# Patient Record
Sex: Male | Born: 1940
Health system: Southern US, Community
[De-identification: ages and names within clinical notes are randomized; demographics above are authoritative.]

## PROBLEM LIST (undated history)

## (undated) DIAGNOSIS — N200 Calculus of kidney: Secondary | ICD-10-CM

## (undated) DIAGNOSIS — M1990 Unspecified osteoarthritis, unspecified site: Secondary | ICD-10-CM

## (undated) DIAGNOSIS — Z87442 Personal history of urinary calculi: Secondary | ICD-10-CM

## (undated) DIAGNOSIS — M5430 Sciatica, unspecified side: Secondary | ICD-10-CM

## (undated) DIAGNOSIS — I10 Essential (primary) hypertension: Secondary | ICD-10-CM

## (undated) DIAGNOSIS — C801 Malignant (primary) neoplasm, unspecified: Secondary | ICD-10-CM

## (undated) HISTORY — PX: NEPHRECTOMY: SHX65

## (undated) HISTORY — PX: CATARACT EXTRACTION: SUR2

## (undated) HISTORY — DX: Essential (primary) hypertension: I10

## (undated) HISTORY — PX: KIDNEY SURGERY: SHX687

## (undated) HISTORY — PX: EYE SURGERY: SHX253

---

## 2008-10-23 ENCOUNTER — Emergency Department (HOSPITAL_COMMUNITY): Admission: EM | Admit: 2008-10-23 | Discharge: 2008-10-23 | Payer: Self-pay | Admitting: Emergency Medicine

## 2008-11-02 ENCOUNTER — Ambulatory Visit (HOSPITAL_COMMUNITY): Admission: RE | Admit: 2008-11-02 | Discharge: 2008-11-02 | Payer: Self-pay | Admitting: Urology

## 2010-06-26 LAB — DIFFERENTIAL
Basophils Absolute: 0 K/uL (ref 0.0–0.1)
Basophils Relative: 0 % (ref 0–1)
Eosinophils Absolute: 0.1 K/uL (ref 0.0–0.7)
Eosinophils Relative: 2 % (ref 0–5)
Lymphocytes Relative: 38 % (ref 12–46)
Lymphs Abs: 2.2 K/uL (ref 0.7–4.0)
Monocytes Absolute: 0.4 K/uL (ref 0.1–1.0)
Monocytes Relative: 8 % (ref 3–12)
Neutro Abs: 3 K/uL (ref 1.7–7.7)
Neutrophils Relative %: 52 % (ref 43–77)

## 2010-06-26 LAB — URINE CULTURE
Colony Count: NO GROWTH
Culture: NO GROWTH

## 2010-06-26 LAB — URINALYSIS, ROUTINE W REFLEX MICROSCOPIC
Glucose, UA: NEGATIVE mg/dL
Leukocytes, UA: NEGATIVE
Protein, ur: 100 mg/dL — AB

## 2010-06-26 LAB — CBC
HCT: 42.4 % (ref 39.0–52.0)
Hemoglobin: 14.5 g/dL (ref 13.0–17.0)
MCHC: 34.3 g/dL (ref 30.0–36.0)
MCV: 92.5 fL (ref 78.0–100.0)
Platelets: 208 K/uL (ref 150–400)
RBC: 4.58 MIL/uL (ref 4.22–5.81)
RDW: 13.3 % (ref 11.5–15.5)
WBC: 5.8 K/uL (ref 4.0–10.5)

## 2010-06-26 LAB — URINE MICROSCOPIC-ADD ON

## 2010-06-26 LAB — POCT I-STAT, CHEM 8
BUN: 16 mg/dL (ref 6–23)
Chloride: 107 mEq/L (ref 96–112)
Creatinine, Ser: 1 mg/dL (ref 0.4–1.5)
Glucose, Bld: 105 mg/dL — ABNORMAL HIGH (ref 70–99)
Potassium: 3.8 mEq/L (ref 3.5–5.1)

## 2010-08-18 ENCOUNTER — Other Ambulatory Visit: Payer: Self-pay | Admitting: Family Medicine

## 2010-08-18 DIAGNOSIS — M79604 Pain in right leg: Secondary | ICD-10-CM

## 2010-08-18 DIAGNOSIS — IMO0002 Reserved for concepts with insufficient information to code with codable children: Secondary | ICD-10-CM

## 2010-08-23 ENCOUNTER — Ambulatory Visit (HOSPITAL_COMMUNITY)
Admission: RE | Admit: 2010-08-23 | Discharge: 2010-08-23 | Disposition: A | Discharge status: Home / Self Care | Payer: BC Managed Care – PPO | Source: Ambulatory Visit | Attending: Family Medicine | Admitting: Family Medicine

## 2010-08-23 DIAGNOSIS — M79604 Pain in right leg: Secondary | ICD-10-CM

## 2010-08-23 DIAGNOSIS — I739 Peripheral vascular disease, unspecified: Secondary | ICD-10-CM | POA: Insufficient documentation

## 2010-08-23 DIAGNOSIS — N289 Disorder of kidney and ureter, unspecified: Secondary | ICD-10-CM | POA: Insufficient documentation

## 2010-08-23 DIAGNOSIS — R109 Unspecified abdominal pain: Secondary | ICD-10-CM | POA: Insufficient documentation

## 2010-08-24 ENCOUNTER — Other Ambulatory Visit: Payer: Self-pay | Admitting: Family Medicine

## 2010-08-24 DIAGNOSIS — N2889 Other specified disorders of kidney and ureter: Secondary | ICD-10-CM

## 2010-08-31 ENCOUNTER — Ambulatory Visit
Admission: RE | Admit: 2010-08-31 | Discharge: 2010-08-31 | Disposition: A | Discharge status: Home / Self Care | Payer: BC Managed Care – PPO | Source: Ambulatory Visit | Attending: Family Medicine | Admitting: Family Medicine

## 2010-08-31 DIAGNOSIS — N2889 Other specified disorders of kidney and ureter: Secondary | ICD-10-CM

## 2010-08-31 MED ORDER — GADOBENATE DIMEGLUMINE 529 MG/ML IV SOLN
20.0000 mL | Freq: Once | INTRAVENOUS | Status: AC | PRN
  Administered 2010-08-31: 20 mL via INTRAVENOUS

## 2013-01-30 ENCOUNTER — Other Ambulatory Visit: Payer: Self-pay

## 2013-01-30 MED ORDER — AMLODIPINE BESY-BENAZEPRIL HCL 10-40 MG PO CAPS: 1.0000 | ORAL_CAPSULE | Freq: Every day | ORAL | Status: DC

## 2013-01-30 NOTE — Telephone Encounter (Signed)
ntbs

## 2013-01-30 NOTE — Telephone Encounter (Signed)
Script called to [employer].

## 2013-01-30 NOTE — Telephone Encounter (Signed)
Last seen 04/08/12  [facility]

## 2013-02-28 ENCOUNTER — Other Ambulatory Visit: Payer: Self-pay | Admitting: Nurse Practitioner

## 2013-03-03 NOTE — Telephone Encounter (Signed)
NTBS for future refils

## 2013-03-03 NOTE — Telephone Encounter (Signed)
Last seen 04/08/12  [facility] 

## 2014-06-08 ENCOUNTER — Encounter (HOSPITAL_COMMUNITY): Payer: Self-pay | Admitting: Family Medicine

## 2014-06-08 ENCOUNTER — Emergency Department (HOSPITAL_COMMUNITY)
Admission: EM | Admit: 2014-06-08 | Discharge: 2014-06-08 | Disposition: A | Discharge status: Home / Self Care | Payer: Worker's Compensation | Attending: Emergency Medicine | Admitting: Emergency Medicine

## 2014-06-08 ENCOUNTER — Emergency Department (HOSPITAL_COMMUNITY): Payer: Worker's Compensation

## 2014-06-08 DIAGNOSIS — S60011A Contusion of right thumb without damage to nail, initial encounter: Secondary | ICD-10-CM

## 2014-06-08 DIAGNOSIS — M79645 Pain in left finger(s): Secondary | ICD-10-CM | POA: Diagnosis not present

## 2014-06-08 DIAGNOSIS — S60222A Contusion of left hand, initial encounter: Secondary | ICD-10-CM | POA: Diagnosis not present

## 2014-06-08 DIAGNOSIS — Y9389 Activity, other specified: Secondary | ICD-10-CM | POA: Insufficient documentation

## 2014-06-08 DIAGNOSIS — S6992XA Unspecified injury of left wrist, hand and finger(s), initial encounter: Secondary | ICD-10-CM | POA: Diagnosis not present

## 2014-06-08 DIAGNOSIS — S0003XA Contusion of scalp, initial encounter: Secondary | ICD-10-CM

## 2014-06-08 DIAGNOSIS — Z859 Personal history of malignant neoplasm, unspecified: Secondary | ICD-10-CM | POA: Diagnosis not present

## 2014-06-08 DIAGNOSIS — Z720 Tobacco use: Secondary | ICD-10-CM | POA: Insufficient documentation

## 2014-06-08 DIAGNOSIS — Z79899 Other long term (current) drug therapy: Secondary | ICD-10-CM | POA: Insufficient documentation

## 2014-06-08 DIAGNOSIS — Y9289 Other specified places as the place of occurrence of the external cause: Secondary | ICD-10-CM | POA: Diagnosis not present

## 2014-06-08 DIAGNOSIS — W010XXA Fall on same level from slipping, tripping and stumbling without subsequent striking against object, initial encounter: Secondary | ICD-10-CM | POA: Insufficient documentation

## 2014-06-08 DIAGNOSIS — M25532 Pain in left wrist: Secondary | ICD-10-CM | POA: Diagnosis not present

## 2014-06-08 DIAGNOSIS — S6991XA Unspecified injury of right wrist, hand and finger(s), initial encounter: Secondary | ICD-10-CM | POA: Diagnosis not present

## 2014-06-08 DIAGNOSIS — Y998 Other external cause status: Secondary | ICD-10-CM | POA: Insufficient documentation

## 2014-06-08 DIAGNOSIS — S0990XA Unspecified injury of head, initial encounter: Secondary | ICD-10-CM | POA: Diagnosis present

## 2014-06-08 HISTORY — DX: Malignant (primary) neoplasm, unspecified: C80.1

## 2014-06-08 MED ORDER — HYDROCODONE-ACETAMINOPHEN 5-325 MG PO TABS
1.0000 | ORAL_TABLET | Freq: Once | ORAL | Status: DC
  Filled 2014-06-08: qty 1

## 2014-06-08 MED ORDER — ACETAMINOPHEN 500 MG PO TABS
1000.0000 mg | ORAL_TABLET | Freq: Once | ORAL | Status: AC
  Administered 2014-06-08: 1000 mg via ORAL
  Filled 2014-06-08: qty 2

## 2014-06-08 NOTE — Discharge Instructions (Signed)
Contusion °A contusion is a deep bruise. Contusions are the result of an injury that caused bleeding under the skin. The contusion may turn blue, purple, or yellow. Minor injuries will give you a painless contusion, but more severe contusions may stay painful and swollen for a few weeks.  °CAUSES  °A contusion is usually caused by a blow, trauma, or direct force to an area of the body. °SYMPTOMS  °· Swelling and redness of the injured area. °· Bruising of the injured area. °· Tenderness and soreness of the injured area. °· Pain. °DIAGNOSIS  °The diagnosis can be made by taking a history and physical exam. An X-ray, CT scan, or MRI may be needed to determine if there were any associated injuries, such as fractures. °TREATMENT  °Specific treatment will depend on what area of the body was injured. In general, the best treatment for a contusion is resting, icing, elevating, and applying cold compresses to the injured area. Over-the-counter medicines may also be recommended for pain control. Ask your caregiver what the best treatment is for your contusion. °HOME CARE INSTRUCTIONS  °· Put ice on the injured area. °¨ Put ice in a plastic bag. °¨ Place a towel between your skin and the bag. °¨ Leave the ice on for 15-20 minutes, 3-4 times a day, or as directed by your health care provider. °· Only take over-the-counter or prescription medicines for pain, discomfort, or fever as directed by your caregiver. Your caregiver may recommend avoiding anti-inflammatory medicines (aspirin, ibuprofen, and naproxen) for 48 hours because these medicines may increase bruising. °· Rest the injured area. °· If possible, elevate the injured area to reduce swelling. °SEEK IMMEDIATE MEDICAL CARE IF:  °· You have increased bruising or swelling. °· You have pain that is getting worse. °· Your swelling or pain is not relieved with medicines. °MAKE SURE YOU:  °· Understand these instructions. °· Will watch your condition. °· Will get help right  away if you are not doing well or get worse. °Document Released: 12/14/2004 Document Revised: 03/11/2013 Document Reviewed: 01/09/2011 °ExitCare® Patient Information ©2015 [employer]. This information is not intended to replace advice given to you by your health care provider. Make sure you discuss any questions you have with your health care provider. ° °

## 2014-06-08 NOTE — ED Notes (Signed)
Pt tripped over the curb at work and fell and hit face, head and right side. sts some blurry vision after fall. Denies LOC. Abrasion to face. Denies blood thinners.

## 2014-06-08 NOTE — ED Provider Notes (Signed)
CSN: [id number]     Arrival date & time 06/08/14  1403 History   First MD Initiated Contact with Patient 06/08/14 1635     Chief Complaint  Patient presents with  . Fall     (Consider location/radiation/quality/duration/timing/severity/associated sxs/prior Treatment) Patient is a 74 y.o. male presenting with fall. The history is provided by the patient.  Fall This is a new problem. The current episode started today. The problem occurs constantly. The problem has been resolved. Associated symptoms include arthralgias. Pertinent negatives include no myalgias or neck pain. Exacerbated by: tripped. He has tried nothing for the symptoms. The treatment provided no relief.    Past Medical History  Diagnosis Date  . Cancer    Past Surgical History  Procedure Laterality Date  . Kidney surgery     History reviewed. No pertinent family history. History  Substance Use Topics  . Smoking status: Light Tobacco Smoker    Types: Cigars  . Smokeless tobacco: Not on file  . Alcohol Use: No    Review of Systems  Musculoskeletal: Positive for arthralgias. Negative for myalgias, gait problem and neck pain.  All other systems reviewed and are negative.     Allergies  Review of patient's allergies indicates no known allergies.  Home Medications   Prior to Admission medications   Medication Sig Start Date End Date Taking? Authorizing Provider  Diphenhydramine-APAP, sleep, (TYLENOL PM EXTRA STRENGTH PO) Take 1 tablet by mouth at bedtime as needed (sleep/pain).   Yes Historical Provider, MD  amLODipine-benazepril (LOTREL) 10-40 MG per capsule TAKE ONE CAPSULE ONCE DAILY Patient not taking: Reported on 06/08/2014 02/28/13   Mary-Margaret Hassell Done, FNP   BP 176/90 mmHg  Pulse 72  Temp(Src) 97.6 F (36.4 C)  Resp 18  Wt 233 lb (105.688 kg)  SpO2 97% Physical Exam  Constitutional: He is oriented to person, place, and time. He appears well-developed and well-nourished. No distress.  HENT:   Head: Normocephalic.  Mouth/Throat: Oropharynx is clear and moist. No oropharyngeal exudate.  Abrasion with contusion to right frontal forehead  Eyes: Conjunctivae and EOM are normal. Pupils are equal, round, and reactive to light.  Neck: Normal range of motion. Neck supple.  Cardiovascular: Normal rate, regular rhythm, normal heart sounds and intact distal pulses.  Exam reveals no gallop and no friction rub.   No murmur heard. Pulmonary/Chest: Effort normal and breath sounds normal. No respiratory distress. He has no wheezes. He has no rales.  Abdominal: Soft. He exhibits no distension and no mass. There is no tenderness. There is no rebound and no guarding.  Musculoskeletal: Normal range of motion. He exhibits no edema or tenderness.  Right thumb with contusion over distal phalanx. Point tender with good cap refill. Right hand is neurovascularly intact.  Left hand with bruising over the thenar eminence. Range of motion intact but point tenderness over base of thumb. Neurovascular intact.  Lymphadenopathy:    He has no cervical adenopathy.  Neurological: He is alert and oriented to person, place, and time. He has normal strength. No cranial nerve deficit or sensory deficit. He displays a negative Romberg sign. Coordination and gait normal.  Ambulatory without ataxia. No dysmetria on finger nose finger.  Skin: Skin is warm and dry. No rash noted. He is not diaphoretic.  Psychiatric: He has a normal mood and affect. His behavior is normal. Judgment and thought content normal.  Nursing note and vitals reviewed.   ED Course  Procedures (including critical care time) Labs Review Labs Reviewed -  No data to display  Imaging Review Dg Wrist Complete Left  06/08/2014   CLINICAL DATA:  Tripped at work hurting both hands when trying to brace his fall, pain at thumb and lateral LEFT wrist  EXAM: LEFT WRIST - COMPLETE 3+ VIEW  COMPARISON:  None  FINDINGS: Osseous mineralization normal.   Degenerative changes first CMC joint and at distal pole scaphoid.  Remain joint spaces preserved.  No acute fracture, dislocation or bone destruction.  IMPRESSION: Degenerative changes at first [facility] joint and at radial border of carpus.  No definite acute osseous findings.   Electronically Signed   By: Lavonia Dana M.D.   On: 06/08/2014 17:22   Ct Head Wo Contrast  06/08/2014   CLINICAL DATA:  [place] and hit head today.  EXAM: CT HEAD WITHOUT CONTRAST  TECHNIQUE: Contiguous axial images were obtained from the base of the skull through the vertex without intravenous contrast.  COMPARISON:  None.  FINDINGS: There is a small right frontal scalp hematoma. No underlying skull fracture.  The ventricles are normal. No extra-axial fluid collections. No acute intracranial findings or mass lesions. The brainstem and cerebellum are grossly normal. The bony structures are intact. Moderate chronic right-sided maxillary sinus disease.  IMPRESSION: No acute intracranial findings or skull fracture.  Right frontal scalp hematoma.   Electronically Signed   By: Marijo Sanes M.D.   On: 06/08/2014 18:42   Dg Hand Complete Left  06/08/2014   CLINICAL DATA:  Tripped at work and fell. Pain at thumb and lateral aspect of left wrist.  EXAM: LEFT HAND - COMPLETE 3+ VIEW  COMPARISON:  Wrist films of same date, dictated separately.  FINDINGS: Moderate osteoarthritis involves the base of the thumb. No acute fracture or dislocation. Ossific densities adjacent to the ulnar aspect of the base of the thumb are favored to be degenerative.  IMPRESSION: Advanced osteoarthritis today's the thumb. No convincing evidence of acute superimposed fracture.   Electronically Signed   By: Abigail Miyamoto M.D.   On: 06/08/2014 17:23   Dg Hand Complete Right  06/08/2014   CLINICAL DATA:  Tripped at work, with injury to both hands during fall. Initial encounter.  EXAM: RIGHT HAND - COMPLETE 3+ VIEW  COMPARISON:  None.  FINDINGS: No evidence of acute fracture or  dislocation. No opaque foreign body.  Chronic lucency through the dorsal and medial radial epiphysis which could reflect remote injury or ossicle.  Advanced first [place] osteoarthritis with spurring and mild subluxation. Mild osteoarthritic change also noted at the STT joint. Mild diffuse interphalangeal joint narrowing.  IMPRESSION: 1. No acute osseous findings. 2. Prominent first [place] osteoarthritis.   Electronically Signed   By: Monte Fantasia M.D.   On: 06/08/2014 17:21     EKG Interpretation None      MDM   Final diagnoses:  Scalp contusion, initial encounter  Hand contusion, left, initial encounter  Thumb contusion, right, initial encounter   74 year old old male presents with mechanical fall. Bilateral hand pain and head contusion. Plain films obtained from urgent care are unable to be viewed. Symptoms repeated and CT head ordered. No blood thinners. He is hypertensive and has a history of hypertension but is not on any medications. Question hypertension related pain as he states the blood pressure is usually 751 systolic when he checks at home. Following workup, will DC back to PCP for blood pressure management.  CT negative. Blood pressure improved with pain control. Nonfocal neurologic exam and ambulatory without difficulty. Plain films  of hands and wrists negative. Will DC with supportive care and PCP follow-up.  Larence Penning, MD 06/08/14 Kathyrn Drown  Leonard Schwartz, MD 06/13/14 [phone]

## 2015-02-26 ENCOUNTER — Telehealth: Payer: Self-pay | Admitting: General Practice

## 2015-03-11 ENCOUNTER — Encounter: Payer: Self-pay | Admitting: Family Medicine

## 2015-03-11 ENCOUNTER — Ambulatory Visit (INDEPENDENT_AMBULATORY_CARE_PROVIDER_SITE_OTHER): Payer: Medicare Other | Admitting: Family Medicine

## 2015-03-11 VITALS — BP 194/98 | HR 84 | Temp 97.5°F | Ht 70.0 in | Wt 225.6 lb

## 2015-03-11 DIAGNOSIS — Z23 Encounter for immunization: Secondary | ICD-10-CM

## 2015-03-11 DIAGNOSIS — I10 Essential (primary) hypertension: Secondary | ICD-10-CM | POA: Insufficient documentation

## 2015-03-11 DIAGNOSIS — E785 Hyperlipidemia, unspecified: Secondary | ICD-10-CM

## 2015-03-11 DIAGNOSIS — M25572 Pain in left ankle and joints of left foot: Secondary | ICD-10-CM | POA: Diagnosis not present

## 2015-03-11 MED ORDER — AMLODIPINE BESYLATE 10 MG PO TABS: 10.0000 mg | ORAL_TABLET | Freq: Every day | ORAL | Status: DC

## 2015-03-11 MED ORDER — ROSUVASTATIN CALCIUM 10 MG PO TABS: 10.0000 mg | ORAL_TABLET | Freq: Every day | ORAL | Status: DC

## 2015-03-11 NOTE — Patient Instructions (Signed)
Great to meet you!  Lets follow up in 4-6 weeks for high blood pressure  We will call you with results of your lab work  Start amlodipine 1/2 pill for 4 pills than 1 pill daily  Start crestor for cholesterol

## 2015-03-11 NOTE — Progress Notes (Signed)
   HPI  Patient presents today reestablish care and discuss left ankle pain.  Patient has a history of hypertension and hyperlipidemia. He tolerated Lipitor) name but then had myalgias on generic Lipitor, he stopped this about 4 years ago. His previously treated with amlodipine, benazepril and reports that his blood pressure actually went down from 865 average systolic to 784 average systolic after he stops this, this is also about 4 years ago.  He does not check blood pressure at home.  No chest pain, dyspnea, palpitations. He does have some leg swelling and a day as well as left ankle swelling with pain.  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 He describes it as left lateral ankle pain that radiates up his leg occasionally, it's worse with long walks, he tolerates walking normally easily. After being on his feet for several hours he has pain. It's not really helped much by Tylenol or Aleve. It is improved with flex all cream Mild swelling intermittently  PMH: Smoking status noted ROS: Per HPI  Objective: BP 194/98 mmHg  Pulse 84  Temp(Src) 97.5 F (36.4 C) (Oral)  Ht '5\' 10"'$  (1.778 m)  Wt 225 lb 9.6 oz (102.331 kg)  BMI 32.37 kg/m2 Gen: NAD, alert, cooperative with exam HEENT: NCAT, TMs normal bilaterally, nares clear, oropharynx clear CV: RRR, good S1/S2, no murmur Resp: CTABL, no wheezes, non-labored Abd: SNTND, BS present, no guarding or organomegaly Ext: No edema, warm Neuro: Alert and oriented, No gross deficits Musculoskeletal: Left ankle without any erythema, or gross deformity No tenderness to palpation of any bony or tendinous landmarks No pain with inversion or eversion, Good joint stability  Assessment and plan:  # Hypertension Recommended restarting amlodipine, one half pill for 1 week then 1 pill Return in 4-6 weeks No red  flags Labs, fasting  # Hyperlipidemia try Crestor, did not tolerate generic atorvastatin Labs  # Healthcare maintenance Flu shot Discussed colonoscopy, he declines so he is given an FOBT card  # Ankle pain OA versus mild ankle sprain Recommended supportive care, ice, heat, compression with activity Tylenol, low-dose NSAIDs Consider x-ray if still continue neck  Orders Placed This Encounter  Procedures  . Lipid Panel  . CMP14+EGFR  . CBC with Differential    Meds ordered this encounter  Medications  . amLODipine (NORVASC) 10 MG tablet    Sig: Take 1 tablet (10 mg total) by mouth daily.    Dispense:  90 tablet    Refill:  3  . rosuvastatin (CRESTOR) 10 MG tablet    Sig: Take 1 tablet (10 mg total) by mouth daily.    Dispense:  30 tablet    Refill:  [place], MD [facility] 03/11/2015, 11:08 AM

## 2015-03-12 LAB — CBC WITH DIFFERENTIAL/PLATELET
BASOS: 0 %
Basophils Absolute: 0 10*3/uL (ref 0.0–0.2)
EOS (ABSOLUTE): 0.1 10*3/uL (ref 0.0–0.4)
Eos: 1 %
HEMOGLOBIN: 15.4 g/dL (ref 12.6–17.7)
Hematocrit: 44.7 % (ref 37.5–51.0)
IMMATURE GRANS (ABS): 0 10*3/uL (ref 0.0–0.1)
IMMATURE GRANULOCYTES: 0 %
LYMPHS: 29 %
Lymphocytes Absolute: 1.9 10*3/uL (ref 0.7–3.1)
MCH: 31.5 pg (ref 26.6–33.0)
MCHC: 34.5 g/dL (ref 31.5–35.7)
MCV: 91 fL (ref 79–97)
MONOCYTES: 7 %
Monocytes Absolute: 0.5 10*3/uL (ref 0.1–0.9)
NEUTROS PCT: 63 %
Neutrophils Absolute: 4.1 10*3/uL (ref 1.4–7.0)
PLATELETS: 234 10*3/uL (ref 150–379)
RBC: 4.89 x10E6/uL (ref 4.14–5.80)
RDW: 14 % (ref 12.3–15.4)
WBC: 6.5 10*3/uL (ref 3.4–10.8)

## 2015-03-12 LAB — LIPID PANEL
CHOL/HDL RATIO: 4.4 ratio (ref 0.0–5.0)
Cholesterol, Total: 192 mg/dL (ref 100–199)
HDL: 44 mg/dL (ref >39)
LDL CALC: 101 mg/dL — AB (ref 0–99)
Triglycerides: 233 mg/dL — ABNORMAL HIGH (ref 0–149)
VLDL Cholesterol Cal: 47 mg/dL — ABNORMAL HIGH (ref 5–40)

## 2015-03-12 LAB — CMP14+EGFR
ALT: 14 IU/L (ref 0–44)
AST: 20 IU/L (ref 0–40)
Albumin/Globulin Ratio: 2.4 (ref 1.1–2.5)
Albumin: 4.7 g/dL (ref 3.5–4.8)
Alkaline Phosphatase: 64 IU/L (ref 39–117)
BUN/Creatinine Ratio: 15 (ref 10–22)
BUN: 17 mg/dL (ref 8–27)
Bilirubin Total: 0.5 mg/dL (ref 0.0–1.2)
CALCIUM: 9.2 mg/dL (ref 8.6–10.2)
CO2: 21 mmol/L (ref 18–29)
CREATININE: 1.13 mg/dL (ref 0.76–1.27)
Chloride: 101 mmol/L (ref 96–106)
GFR calc Af Amer: 75 mL/min/{1.73_m2} (ref >59)
GFR, EST NON AFRICAN AMERICAN: 65 mL/min/{1.73_m2} (ref >59)
GLUCOSE: 123 mg/dL — AB (ref 65–99)
Globulin, Total: 2 g/dL (ref 1.5–4.5)
POTASSIUM: 4.7 mmol/L (ref 3.5–5.2)
Sodium: 143 mmol/L (ref 134–144)
TOTAL PROTEIN: 6.7 g/dL (ref 6.0–8.5)

## 2015-04-08 ENCOUNTER — Encounter: Payer: Self-pay | Admitting: Family Medicine

## 2015-04-08 ENCOUNTER — Ambulatory Visit (INDEPENDENT_AMBULATORY_CARE_PROVIDER_SITE_OTHER): Payer: Medicare Other | Admitting: Family Medicine

## 2015-04-08 VITALS — BP 162/83 | HR 78 | Temp 98.1°F | Ht 70.0 in | Wt 224.0 lb

## 2015-04-08 DIAGNOSIS — M25572 Pain in left ankle and joints of left foot: Secondary | ICD-10-CM

## 2015-04-08 DIAGNOSIS — I10 Essential (primary) hypertension: Secondary | ICD-10-CM

## 2015-04-08 DIAGNOSIS — R7301 Impaired fasting glucose: Secondary | ICD-10-CM | POA: Diagnosis not present

## 2015-04-08 DIAGNOSIS — E785 Hyperlipidemia, unspecified: Secondary | ICD-10-CM | POA: Diagnosis not present

## 2015-04-08 MED ORDER — AMLODIPINE BESYLATE 5 MG PO TABS: 5.0000 mg | ORAL_TABLET | Freq: Every day | ORAL | Status: DC

## 2015-04-08 NOTE — Progress Notes (Signed)
   HPI  Patient presents today to discuss hypertension and hyperlipidemia.  Hypertension Does not check at home No chest pain, palpitations, leg edema He has had headaches since starting the medication, he's also had weakness and fatigue. Before his energy was very good.  His ankle pain has resolved.  Hyperlipidemia Not tolerating rosuvastatin, complains of multiple myalgias specifically the legs, shoulders, and arms.  Aware of his impaired fasting glucose and is watching his diet much more closely.  PMH: Smoking status noted ROS: Per HPI  Objective: BP 162/83 mmHg  Pulse 78  Temp(Src) 98.1 F (36.7 C) (Oral)  Ht 5\' 10"  (1.778 m)  Wt 224 lb (101.606 kg)  BMI 32.14 kg/m2 Gen: NAD, alert, cooperative with exam HEENT: NCAT CV: RRR, good S1/S2, no murmur Resp: CTABL, no wheezes, non-labored Ext: No edema, warm Neuro: Alert and oriented, No gross deficits  Assessment and plan:  # Hypertension Still not at goal, however he is not tolerating the adjustment so far. Slowdown treatment and back off to amlodipine 5 mg Plan add on 5 additional milligrams or different agent in 2 months if he is tolerating this. Consider HCTZ or lisinopril if he does not tolerate this, as he is concerned that his symptoms are due to amlodipine   # Hyperlipidemia LDL of 101, no compelling reason to treat him aggressively considering his statin intolerance. He is now having myalgias with generic Crestor and Lipitor, if he needs a statin in the future I think we should consider pitavastatin or brand-name Lipitor  # impaired fasting glucose Discussed, diet and exercise He understand  # ankle pain resolved   Meds ordered this encounter  Medications  . amLODipine (NORVASC) 5 MG tablet    Sig: Take 1 tablet (5 mg total) by mouth daily.    Dispense:  30 tablet    Refill:  [place], MD [facility] 04/08/2015, 8:36 AM

## 2015-04-08 NOTE — Patient Instructions (Signed)
Great to see you!  I am so sorry for your reaction, I think it is mostly due to crestor, but we will slow down on the blood pressure treatment as well  Stop rosuvastatin  Decrease amlodipine to 5 mg, I have sent a new Rx  Call me in 2 weeks if the pains havent stopped and we can change amlodipine as well  Lets follow up in 2 months

## 2016-05-01 ENCOUNTER — Encounter: Payer: Self-pay | Admitting: Family Medicine

## 2016-05-01 ENCOUNTER — Encounter (INDEPENDENT_AMBULATORY_CARE_PROVIDER_SITE_OTHER): Payer: Self-pay

## 2016-05-01 ENCOUNTER — Ambulatory Visit (INDEPENDENT_AMBULATORY_CARE_PROVIDER_SITE_OTHER): Payer: Medicare Other | Admitting: Family Medicine

## 2016-05-01 VITALS — BP 154/81 | HR 57 | Temp 97.3°F | Ht 70.0 in | Wt 213.6 lb

## 2016-05-01 DIAGNOSIS — E785 Hyperlipidemia, unspecified: Secondary | ICD-10-CM | POA: Diagnosis not present

## 2016-05-01 DIAGNOSIS — J0100 Acute maxillary sinusitis, unspecified: Secondary | ICD-10-CM

## 2016-05-01 DIAGNOSIS — I10 Essential (primary) hypertension: Secondary | ICD-10-CM

## 2016-05-01 DIAGNOSIS — Z7689 Persons encountering health services in other specified circumstances: Secondary | ICD-10-CM | POA: Diagnosis not present

## 2016-05-01 MED ORDER — LEVOFLOXACIN 500 MG PO TABS: 500.0000 mg | ORAL_TABLET | Freq: Every day | ORAL | 0 refills | Status: DC

## 2016-05-01 NOTE — Patient Instructions (Signed)
Great to see you!  I believe your shaking episodes ae from the illness, PLease come back right away if anything gets worse.    Sinusitis, Adult Sinusitis is soreness and inflammation of your sinuses. Sinuses are hollow spaces in the bones around your face. They are located:  Around your eyes.  In the middle of your forehead.  Behind your nose.  In your cheekbones. Your sinuses and nasal passages are lined with a stringy fluid (mucus). Mucus normally drains out of your sinuses. When your nasal tissues get inflamed or swollen, the mucus can get trapped or blocked so air cannot flow through your sinuses. This lets bacteria, viruses, and funguses grow, and that leads to infection. Follow these instructions at home: Medicines  Take, use, or apply over-the-counter and prescription medicines only as told by your doctor. These may include nasal sprays.  If you were prescribed an antibiotic medicine, take it as told by your doctor. Do not stop taking the antibiotic even if you start to feel better. Hydrate and Humidify  Drink enough water to keep your pee (urine) clear or pale yellow.  Use a cool mist humidifier to keep the humidity level in your home above 50%.  Breathe in steam for 10-15 minutes, 3-4 times a day or as told by your doctor. You can do this in the bathroom while a hot shower is running.  Try not to spend time in cool or dry air. Rest  Rest as much as possible.  Sleep with your head raised (elevated).  Make sure to get enough sleep each night. General instructions  Put a warm, moist washcloth on your face 3-4 times a day or as told by your doctor. This will help with discomfort.  Wash your hands often with soap and water. If there is no soap and water, use hand sanitizer.  Do not smoke. Avoid being around people who are smoking (secondhand smoke).  Keep all follow-up visits as told by your doctor. This is important. Contact a doctor if:  You have a  fever.  Your symptoms get worse.  Your symptoms do not get better within 10 days. Get help right away if:  You have a very bad headache.  You cannot stop throwing up (vomiting).  You have pain or swelling around your face or eyes.  You have trouble seeing.  You feel confused.  Your neck is stiff.  You have trouble breathing. This information is not intended to replace advice given to you by your health care provider. Make sure you discuss any questions you have with your health care provider. Document Released: 08/23/2007 Document Revised: 10/31/2015 Document Reviewed: 12/30/2014 Elsevier Interactive Patient Education  2017 [employer].

## 2016-05-01 NOTE — Progress Notes (Signed)
   HPI  Patient presents today here with shaking spells and concern for sinus infection.  Patient also states that he's fasting and requests annual labs. Hyperlipidemia Watching diet moderately, no regular exercise.  Hypertension Good medication compliance, no chest pain.  Acute illness Patient states for about 2 weeks he's had frontal sinus pain and pressure. He's been treating it with nasal steroids with not much improvement. Starting Friday he had 2 episodes of shaking chills. He explains that they lasted 5-10 minutes per episode and his shaking was nearly uncontrollable. He does have arthritis and some aches and pains, however they felt better after the shaking. He did not have a postictal state or loss of consciousness.    PMH: Smoking status noted ROS: Per HPI  Objective: BP (!) 154/81   Pulse (!) 57   Temp 97.3 F (36.3 C) (Oral)   Ht 5\' 10"  (1.778 m)   Wt 213 lb 9.6 oz (96.9 kg)   BMI 30.65 kg/m  Gen: NAD, alert, cooperative with exam HEENT: NCAT, pharynx moist and clear, nares with some erythema but clear, TMs normal bilaterally, EOMI, PERRLA, cloudiness of the left lens - suspect cataract  CV: RRR, good S1/S2, no murmur Resp: CTABL, no wheezes, non-labored Abd: SNTND, BS present, no guarding or organomegaly Ext: No edema, warm Neuro: Alert and oriented, No gross deficits  Assessment and plan:  # Acute maxillary sinusitis Treatment Levaquin, treating more aggressively than average given shaking chills. Patient could have underlying influenza, however given her comorbidities he would not be within the 48-hour window for Tamiflu He is afebrile and well-appearing on exam  # hypertension Elevated blood pressure, continue amlodipine, BP goal 150 given his age  # hyperlipidemia Repeat labs No medications at this time    Orders Placed This Encounter  Procedures  . CBC with Differential/Platelet  . COMPLETE METABOLIC PANEL WITH GFR  . Lipid panel  . TSH      Meds ordered this encounter  Medications  . levofloxacin (LEVAQUIN) 500 MG tablet    Sig: Take 1 tablet (500 mg total) by mouth daily.    Dispense:  10 tablet    Refill:  0    Laroy Apple, MD [facility] 05/01/2016, 9:46 AM

## 2016-05-02 LAB — CBC WITH DIFFERENTIAL/PLATELET
BASOS: 0 %
Basophils Absolute: 0 10*3/uL (ref 0.0–0.2)
EOS (ABSOLUTE): 0.1 10*3/uL (ref 0.0–0.4)
EOS: 2 %
HEMATOCRIT: 38.1 % (ref 37.5–51.0)
Hemoglobin: 12.7 g/dL — ABNORMAL LOW (ref 13.0–17.7)
IMMATURE GRANS (ABS): 0 10*3/uL (ref 0.0–0.1)
IMMATURE GRANULOCYTES: 0 %
LYMPHS: 30 %
Lymphocytes Absolute: 1.9 10*3/uL (ref 0.7–3.1)
MCH: 31 pg (ref 26.6–33.0)
MCHC: 33.3 g/dL (ref 31.5–35.7)
MCV: 93 fL (ref 79–97)
Monocytes Absolute: 0.7 10*3/uL (ref 0.1–0.9)
Monocytes: 12 %
NEUTROS ABS: 3.4 10*3/uL (ref 1.4–7.0)
NEUTROS PCT: 56 %
Platelets: 166 10*3/uL (ref 150–379)
RBC: 4.1 x10E6/uL — ABNORMAL LOW (ref 4.14–5.80)
RDW: 14.9 % (ref 12.3–15.4)
WBC: 6.2 10*3/uL (ref 3.4–10.8)

## 2016-05-02 LAB — CMP14+EGFR
A/G RATIO: 2 (ref 1.2–2.2)
ALK PHOS: 44 IU/L (ref 39–117)
ALT: 12 IU/L (ref 0–44)
AST: 19 IU/L (ref 0–40)
Albumin: 4 g/dL (ref 3.5–4.8)
BILIRUBIN TOTAL: 0.5 mg/dL (ref 0.0–1.2)
BUN/Creatinine Ratio: 10 (ref 10–24)
BUN: 12 mg/dL (ref 8–27)
CO2: 26 mmol/L (ref 18–29)
Calcium: 8.6 mg/dL (ref 8.6–10.2)
Chloride: 102 mmol/L (ref 96–106)
Creatinine, Ser: 1.15 mg/dL (ref 0.76–1.27)
GFR calc Af Amer: 73 mL/min/{1.73_m2} (ref >59)
GFR, EST NON AFRICAN AMERICAN: 63 mL/min/{1.73_m2} (ref >59)
GLOBULIN, TOTAL: 2 g/dL (ref 1.5–4.5)
Glucose: 130 mg/dL — ABNORMAL HIGH (ref 65–99)
POTASSIUM: 4 mmol/L (ref 3.5–5.2)
SODIUM: 142 mmol/L (ref 134–144)
Total Protein: 6 g/dL (ref 6.0–8.5)

## 2016-05-02 LAB — LIPID PANEL
CHOL/HDL RATIO: 4 ratio (ref 0.0–5.0)
Cholesterol, Total: 148 mg/dL (ref 100–199)
HDL: 37 mg/dL — AB (ref >39)
LDL CALC: 77 mg/dL (ref 0–99)
Triglycerides: 170 mg/dL — ABNORMAL HIGH (ref 0–149)
VLDL CHOLESTEROL CAL: 34 mg/dL (ref 5–40)

## 2016-05-02 LAB — TSH: TSH: 2.48 u[IU]/mL (ref 0.450–4.500)

## 2016-05-04 LAB — SPECIMEN STATUS REPORT

## 2016-05-04 LAB — HGB A1C W/O EAG: Hgb A1c MFr Bld: 5.5 % (ref 4.8–5.6)

## 2016-05-06 ENCOUNTER — Telehealth: Payer: Self-pay | Admitting: Family Medicine

## 2016-05-08 ENCOUNTER — Other Ambulatory Visit: Payer: Self-pay | Admitting: *Deleted

## 2016-05-08 DIAGNOSIS — R718 Other abnormality of red blood cells: Secondary | ICD-10-CM

## 2016-05-08 NOTE — Telephone Encounter (Signed)
Pt aware of labs & recommendations

## 2016-05-11 DIAGNOSIS — H2513 Age-related nuclear cataract, bilateral: Secondary | ICD-10-CM | POA: Diagnosis not present

## 2016-06-05 DIAGNOSIS — H5703 Miosis: Secondary | ICD-10-CM | POA: Diagnosis not present

## 2016-06-05 DIAGNOSIS — H2513 Age-related nuclear cataract, bilateral: Secondary | ICD-10-CM | POA: Diagnosis not present

## 2016-06-05 DIAGNOSIS — H2511 Age-related nuclear cataract, right eye: Secondary | ICD-10-CM | POA: Diagnosis not present

## 2016-06-05 DIAGNOSIS — H25013 Cortical age-related cataract, bilateral: Secondary | ICD-10-CM | POA: Diagnosis not present

## 2016-06-20 DIAGNOSIS — H2511 Age-related nuclear cataract, right eye: Secondary | ICD-10-CM | POA: Diagnosis not present

## 2016-06-20 DIAGNOSIS — H25811 Combined forms of age-related cataract, right eye: Secondary | ICD-10-CM | POA: Diagnosis not present

## 2016-07-06 DIAGNOSIS — H2512 Age-related nuclear cataract, left eye: Secondary | ICD-10-CM | POA: Diagnosis not present

## 2016-07-11 DIAGNOSIS — H2522 Age-related cataract, morgagnian type, left eye: Secondary | ICD-10-CM | POA: Diagnosis not present

## 2016-07-11 DIAGNOSIS — H2512 Age-related nuclear cataract, left eye: Secondary | ICD-10-CM | POA: Diagnosis not present

## 2016-07-11 DIAGNOSIS — H25812 Combined forms of age-related cataract, left eye: Secondary | ICD-10-CM | POA: Diagnosis not present

## 2016-08-17 ENCOUNTER — Ambulatory Visit (INDEPENDENT_AMBULATORY_CARE_PROVIDER_SITE_OTHER): Payer: Medicare Other | Admitting: Family Medicine

## 2016-08-17 ENCOUNTER — Encounter: Payer: Self-pay | Admitting: Family Medicine

## 2016-08-17 DIAGNOSIS — I10 Essential (primary) hypertension: Secondary | ICD-10-CM

## 2016-08-17 DIAGNOSIS — D649 Anemia, unspecified: Secondary | ICD-10-CM

## 2016-08-17 DIAGNOSIS — R718 Other abnormality of red blood cells: Secondary | ICD-10-CM | POA: Diagnosis not present

## 2016-08-17 LAB — CBC WITH DIFFERENTIAL/PLATELET
BASOS ABS: 0 10*3/uL (ref 0.0–0.2)
Basos: 1 %
EOS (ABSOLUTE): 0.1 10*3/uL (ref 0.0–0.4)
EOS: 2 %
HEMATOCRIT: 44.6 % (ref 37.5–51.0)
HEMOGLOBIN: 14.8 g/dL (ref 13.0–17.7)
IMMATURE GRANS (ABS): 0 10*3/uL (ref 0.0–0.1)
IMMATURE GRANULOCYTES: 0 %
LYMPHS ABS: 1.9 10*3/uL (ref 0.7–3.1)
LYMPHS: 32 %
MCH: 30.5 pg (ref 26.6–33.0)
MCHC: 33.2 g/dL (ref 31.5–35.7)
MCV: 92 fL (ref 79–97)
MONOCYTES: 10 %
Monocytes Absolute: 0.6 10*3/uL (ref 0.1–0.9)
NEUTROS PCT: 55 %
Neutrophils Absolute: 3.2 10*3/uL (ref 1.4–7.0)
Platelets: 243 10*3/uL (ref 150–379)
RBC: 4.85 x10E6/uL (ref 4.14–5.80)
RDW: 14.6 % (ref 12.3–15.4)
WBC: 5.8 10*3/uL (ref 3.4–10.8)

## 2016-08-17 MED ORDER — AMLODIPINE BESYLATE 5 MG PO TABS: 5.0000 mg | ORAL_TABLET | Freq: Every day | ORAL | 3 refills | Status: DC

## 2016-08-17 NOTE — Addendum Note (Signed)
Addended by: Nigel Berthold C on: 08/17/2016 01:40 PM   Modules accepted: Orders

## 2016-08-17 NOTE — Addendum Note (Signed)
Addended by: Nigel Berthold C on: 08/17/2016 01:41 PM   Modules accepted: Orders

## 2016-08-17 NOTE — Progress Notes (Signed)
   HPI  Patient presents today for follow-up of anemia and hypertension.  Anemia Recently new finding. Mild fatigue. No blood loss except for some hematuria intermittently with known kidney stones. Patient does not want colonoscopy for screening purposes, he does agree to FOBT and f/u c scope if needed  Hypertension Good medication compliance, 10 mg amlodipine caused hypotension. Patient with blood pressure that's normal at other locations, recently 130s when he had his cataract follow-up. Does not check at home No chest pain or dyspnea.  PMH: Smoking status noted ROS: Per HPI  Objective: BP (!) 152/87   Pulse 81   Temp 98.3 F (36.8 C) (Oral)   Ht 5\' 10"  (1.778 m)   Wt 217 lb 6.4 oz (98.6 kg)   BMI 31.19 kg/m  Gen: NAD, alert, cooperative with exam HEENT: NCAT CV: RRR, good S1/S2, no murmur Resp: CTABL, no wheezes, non-labored Ext: No edema, warm Neuro: Alert and oriented, No gross deficits  Assessment and plan:  # Normocytic anemia Repeat labs, with iron and B12 FOBT- recommended C scope which he is hesistant about    # HTN  controlled at home, elevated here Continue amlodipine 5 mg Offered titrating medication, he also is hesitant about this   Orders Placed This Encounter  Procedures  . Anemia panel    Meds ordered this encounter  Medications  . amLODipine (NORVASC) 5 MG tablet    Sig: Take 1 tablet (5 mg total) by mouth daily.    Dispense:  90 tablet    Refill:  [place], MD [place] 08/17/2016, 12:04 PM

## 2016-08-18 LAB — ANEMIA PROFILE B
FERRITIN: 277 ng/mL (ref 30–400)
Folate: 8.6 ng/mL (ref >3.0)
IRON SATURATION: 20 % (ref 15–55)
IRON: 64 ug/dL (ref 38–169)
TIBC: 325 ug/dL (ref 250–450)
UIBC: 261 ug/dL (ref 111–343)
Vitamin B-12: 743 pg/mL (ref 232–1245)

## 2016-08-18 LAB — SPECIMEN STATUS REPORT

## 2016-08-18 LAB — RETICULOCYTES: Retic Ct Pct: 1.5 % (ref 0.6–2.6)

## 2016-08-21 ENCOUNTER — Telehealth: Payer: Self-pay | Admitting: Family Medicine

## 2016-08-22 NOTE — Telephone Encounter (Signed)
lmtcb

## 2016-08-30 NOTE — Telephone Encounter (Signed)
Aware. 

## 2016-11-09 DIAGNOSIS — H35363 Drusen (degenerative) of macula, bilateral: Secondary | ICD-10-CM | POA: Diagnosis not present

## 2016-11-09 DIAGNOSIS — Z961 Presence of intraocular lens: Secondary | ICD-10-CM | POA: Diagnosis not present

## 2016-11-09 DIAGNOSIS — H26493 Other secondary cataract, bilateral: Secondary | ICD-10-CM | POA: Diagnosis not present

## 2016-11-09 DIAGNOSIS — H26492 Other secondary cataract, left eye: Secondary | ICD-10-CM | POA: Diagnosis not present

## 2016-11-09 DIAGNOSIS — H35031 Hypertensive retinopathy, right eye: Secondary | ICD-10-CM | POA: Diagnosis not present

## 2016-12-14 DIAGNOSIS — H26491 Other secondary cataract, right eye: Secondary | ICD-10-CM | POA: Diagnosis not present

## 2017-05-10 DIAGNOSIS — H43393 Other vitreous opacities, bilateral: Secondary | ICD-10-CM | POA: Diagnosis not present

## 2017-05-10 DIAGNOSIS — Z961 Presence of intraocular lens: Secondary | ICD-10-CM | POA: Diagnosis not present

## 2017-05-12 ENCOUNTER — Ambulatory Visit (INDEPENDENT_AMBULATORY_CARE_PROVIDER_SITE_OTHER): Payer: Medicare Other | Admitting: Physician Assistant

## 2017-05-12 ENCOUNTER — Encounter: Payer: Self-pay | Admitting: Physician Assistant

## 2017-05-12 VITALS — BP 130/88 | HR 84 | Temp 99.0°F | Ht 70.0 in | Wt 212.4 lb

## 2017-05-12 DIAGNOSIS — J111 Influenza due to unidentified influenza virus with other respiratory manifestations: Secondary | ICD-10-CM

## 2017-05-12 DIAGNOSIS — J0111 Acute recurrent frontal sinusitis: Secondary | ICD-10-CM

## 2017-05-12 MED ORDER — AMOXICILLIN 500 MG PO CAPS: 1000.0000 mg | ORAL_CAPSULE | Freq: Two times a day (BID) | ORAL | 0 refills | Status: DC

## 2017-05-12 MED ORDER — OSELTAMIVIR PHOSPHATE 75 MG PO CAPS: 75.0000 mg | ORAL_CAPSULE | Freq: Two times a day (BID) | ORAL | 0 refills | Status: DC

## 2017-05-12 NOTE — Patient Instructions (Signed)
In a few days you may receive a survey in the mail or online from [employer] regarding your visit with us today. Please take a moment to fill this out. Your feedback is very important to our whole office. It can help us better understand your needs as well as improve your experience and satisfaction. Thank you for taking your time to complete it. We care about you.  Dickie Labarre, PA-C  

## 2017-05-14 NOTE — Progress Notes (Signed)
BP 130/88   Pulse 84   Temp 99 F (37.2 C) (Oral)   Ht 5\' 10"  (1.778 m)   Wt 212 lb 6.4 oz (96.3 kg)   BMI 30.48 kg/m    Subjective:    Patient ID: Jesse Mane., male    DOB: Aug 25, 1940, 77 y.o.   MRN: [id number]  HPI: Jesse Vegh. is a 77 y.o. male presenting on 05/12/2017 for Facial Pain and Nasal Congestion This patient has had less than 2 days severe fever, chills, myalgias.  Complains of sinus headache and postnasal drainage. There is copious drainage at times. Associated sore throat, decreased appetite and headache.  Has been exposed to influenza.   Past Medical History:  Diagnosis Date  . Cancer [facility])    Relevant past medical, surgical, family and social history reviewed and updated as indicated. Interim medical history since our last visit reviewed. Allergies and medications reviewed and updated. DATA REVIEWED: CHART IN EPIC  Family History reviewed for pertinent findings.  Review of Systems  Constitutional: Positive for activity change, fatigue and fever. Negative for appetite change.  HENT: Positive for congestion and sore throat. Negative for sinus pressure.   Eyes: Negative.  Negative for pain and visual disturbance.  Respiratory: Negative for cough, chest tightness, shortness of breath and wheezing.   Cardiovascular: Negative.  Negative for chest pain, palpitations and leg swelling.  Gastrointestinal: Positive for nausea. Negative for abdominal pain, diarrhea and vomiting.  Endocrine: Negative.   Genitourinary: Negative.   Musculoskeletal: Positive for back pain and myalgias. Negative for arthralgias.  Skin: Negative.  Negative for color change and rash.  Neurological: Positive for headaches. Negative for weakness and numbness.  Psychiatric/Behavioral: Negative.     Allergies as of 05/12/2017   No Known Allergies     Medication List        Accurate as of 05/12/17 11:59 PM. Always use your most recent med list.          amoxicillin 500 MG  capsule Commonly known as:  AMOXIL Take 2 capsules (1,000 mg total) by mouth 2 (two) times daily.   oseltamivir 75 MG capsule Commonly known as:  TAMIFLU Take 1 capsule (75 mg total) by mouth 2 (two) times daily.          Objective:    BP 130/88   Pulse 84   Temp 99 F (37.2 C) (Oral)   Ht 5\' 10"  (1.778 m)   Wt 212 lb 6.4 oz (96.3 kg)   BMI 30.48 kg/m   No Known Allergies  Wt Readings from Last 3 Encounters:  05/12/17 212 lb 6.4 oz (96.3 kg)  08/17/16 217 lb 6.4 oz (98.6 kg)  05/01/16 213 lb 9.6 oz (96.9 kg)    Physical Exam  Constitutional: He is oriented to person, place, and time. He appears well-developed and well-nourished. He appears distressed.  HENT:  Head: Normocephalic and atraumatic.  Right Ear: Tympanic membrane normal. No drainage. No middle ear effusion.  Left Ear: Tympanic membrane normal. No drainage.  No middle ear effusion.  Nose: Mucosal edema and rhinorrhea present. Right sinus exhibits no maxillary sinus tenderness. Left sinus exhibits no maxillary sinus tenderness.  Mouth/Throat: Uvula is midline. Posterior oropharyngeal erythema present. No oropharyngeal exudate.  Eyes: Conjunctivae and EOM are normal. Pupils are equal, round, and reactive to light. Right eye exhibits no discharge. Left eye exhibits no discharge.  Neck: Normal range of motion.  Cardiovascular: Normal rate, regular rhythm and normal heart sounds.  Pulmonary/Chest: Effort normal and breath sounds normal. No respiratory distress. He has no wheezes.  Abdominal: Soft.  Lymphadenopathy:    He has no cervical adenopathy.  Neurological: He is alert and oriented to person, place, and time.  Skin: Skin is warm and dry.  Psychiatric: He has a normal mood and affect. His behavior is normal.  Nursing note and vitals reviewed.       Assessment & Plan:   1. Influenza - oseltamivir (TAMIFLU) 75 MG capsule; Take 1 capsule (75 mg total) by mouth 2 (two) times daily.  Dispense: 10 capsule;  Refill: 0  2. Acute recurrent frontal sinusitis - amoxicillin (AMOXIL) 500 MG capsule; Take 2 capsules (1,000 mg total) by mouth 2 (two) times daily.  Dispense: 40 capsule; Refill: 0   Continue all other maintenance medications as listed above.  Follow up plan: No Follow-up on file.  Educational handout given for [place] PA-C [place] [address]  [place], [place] [address] [phone]   05/14/2017, 10:09 PM

## 2017-05-16 ENCOUNTER — Ambulatory Visit (INDEPENDENT_AMBULATORY_CARE_PROVIDER_SITE_OTHER): Payer: Medicare Other | Admitting: Physician Assistant

## 2017-05-16 ENCOUNTER — Other Ambulatory Visit: Payer: Self-pay | Admitting: *Deleted

## 2017-05-16 ENCOUNTER — Encounter: Payer: Self-pay | Admitting: Physician Assistant

## 2017-05-16 VITALS — BP 195/85 | HR 55 | Temp 97.6°F | Ht 70.0 in | Wt 212.0 lb

## 2017-05-16 DIAGNOSIS — J111 Influenza due to unidentified influenza virus with other respiratory manifestations: Secondary | ICD-10-CM

## 2017-05-16 DIAGNOSIS — R58 Hemorrhage, not elsewhere classified: Secondary | ICD-10-CM

## 2017-05-16 DIAGNOSIS — Z0000 Encounter for general adult medical examination without abnormal findings: Secondary | ICD-10-CM | POA: Diagnosis not present

## 2017-05-16 DIAGNOSIS — M791 Myalgia, unspecified site: Secondary | ICD-10-CM

## 2017-05-16 DIAGNOSIS — N200 Calculus of kidney: Secondary | ICD-10-CM | POA: Diagnosis not present

## 2017-05-16 LAB — URINALYSIS, COMPLETE
Bilirubin, UA: NEGATIVE
Glucose, UA: NEGATIVE
KETONES UA: NEGATIVE
Nitrite, UA: NEGATIVE
PH UA: 6.5 (ref 5.0–7.5)
SPEC GRAV UA: 1.01 (ref 1.005–1.030)
UUROB: 0.2 mg/dL (ref 0.2–1.0)

## 2017-05-16 LAB — MICROSCOPIC EXAMINATION
Bacteria, UA: NONE SEEN
RBC, UA: >30 /hpf — AB (ref 0–?)
RENAL EPITHEL UA: NONE SEEN /HPF

## 2017-05-16 MED ORDER — CIPROFLOXACIN HCL 500 MG PO TABS: 500.0000 mg | ORAL_TABLET | Freq: Two times a day (BID) | ORAL | 0 refills | Status: DC

## 2017-05-16 NOTE — Progress Notes (Signed)
BP (!) 195/85   Pulse (!) 55   Temp 97.6 F (36.4 C) (Oral)   Ht '5\' 10"'$  (1.778 m)   Wt 212 lb (96.2 kg)   BMI 30.42 kg/m    Subjective:    Patient ID: Jesse Mane., male    DOB: 02-Dec-1940, 77 y.o.   MRN: [id number]  HPI: Jesse Errico. is a 77 y.o. male presenting on 05/16/2017 for Nephrolithiasis; Hip Pain; Back Pain; Knee Pain; and Extremity Weakness  Patient started with a significant upper respiratory infection that developed into a sinus infection.  He was also around a lot of people with influenza.  He was here on Saturday at the urgent visit, he received amoxicillin and Tamiflu.  In the past couple of days he has had a great increase in general myalgias, blood in his urine which he knows is due to a kidney stone.  He has had a kidney stone that is inoperable for the past 8 years.  He does only have 1 kidney.  We have discussed him stopping the Tamiflu.  This may be making him feel worse than the benefit of taking the medicine.  We will give him fluids today here in the office. Particularly hurts in the right hip.  This is not his normal arthritis hip usually gets this left leg.  However he does have the Right side  Past Medical History:  Diagnosis Date  . Cancer [facility])    Relevant past medical, surgical, family and social history reviewed and updated as indicated. Interim medical history since our last visit reviewed. Allergies and medications reviewed and updated. DATA REVIEWED: CHART IN EPIC  Family History reviewed for pertinent findings.  Review of Systems  Constitutional: Positive for activity change, appetite change and fatigue. Negative for fever.  HENT: Positive for congestion and sore throat. Negative for sinus pressure.   Eyes: Negative.  Negative for pain and visual disturbance.  Respiratory: Negative for cough, chest tightness, shortness of breath and wheezing.   Cardiovascular: Negative.  Negative for chest pain, palpitations and leg swelling.    Gastrointestinal: Positive for nausea. Negative for abdominal pain, anal bleeding, blood in stool, constipation, diarrhea and vomiting.  Endocrine: Negative.   Genitourinary: Positive for flank pain. Negative for difficulty urinating.  Musculoskeletal: Positive for back pain and myalgias. Negative for arthralgias.  Skin: Negative.  Negative for color change and rash.  Neurological: Positive for headaches. Negative for weakness and numbness.  Psychiatric/Behavioral: Negative.     Allergies as of 05/16/2017   No Known Allergies     Medication List        Accurate as of 05/16/17  2:31 PM. Always use your most recent med list.          ciprofloxacin 500 MG tablet Commonly known as:  CIPRO Take 1 tablet (500 mg total) by mouth 2 (two) times daily.   oseltamivir 75 MG capsule Commonly known as:  TAMIFLU Take 1 capsule (75 mg total) by mouth 2 (two) times daily.          Objective:    BP (!) 195/85   Pulse (!) 55   Temp 97.6 F (36.4 C) (Oral)   Ht '5\' 10"'$  (1.778 m)   Wt 212 lb (96.2 kg)   BMI 30.42 kg/m   No Known Allergies  Wt Readings from Last 3 Encounters:  05/16/17 212 lb (96.2 kg)  05/12/17 212 lb 6.4 oz (96.3 kg)  08/17/16 217 lb 6.4 oz (98.6 kg)  Physical Exam  Constitutional: He appears well-developed and well-nourished. He appears distressed.  HENT:  Head: Normocephalic and atraumatic.  Eyes: Conjunctivae and EOM are normal. Pupils are equal, round, and reactive to light.  Neck: Normal range of motion. Neck supple.  Cardiovascular: Normal rate, regular rhythm and normal heart sounds.  Pulmonary/Chest: Effort normal and breath sounds normal.  Abdominal: Soft. Bowel sounds are normal. He exhibits no mass. There is tenderness. There is no rebound and no guarding.  Musculoskeletal: Normal range of motion.  Skin: Skin is warm and dry. He is not diaphoretic.    Results for orders placed or performed in visit on 05/16/17  Microscopic Examination  Result  Value Ref Range   WBC, UA 6-10 (A) 0 - 5 /hpf   RBC, UA >30 (A) 0 - 2 /hpf   Epithelial Cells (non renal) 0-10 0 - 10 /hpf   Renal Epithel, UA None seen None seen /hpf   Bacteria, UA None seen None seen/Few  Urinalysis, Complete  Result Value Ref Range   Specific Gravity, UA 1.010 1.005 - 1.030   pH, UA 6.5 5.0 - 7.5   Color, UA Amber (A) Yellow   Appearance Ur Cloudy (A) Clear   Leukocytes, UA 1+ (A) Negative   Protein, UA 1+ (A) Negative/Trace   Glucose, UA Negative Negative   Ketones, UA Negative Negative   RBC, UA 3+ (A) Negative   Bilirubin, UA Negative Negative   Urobilinogen, Ur 0.2 0.2 - 1.0 mg/dL   Nitrite, UA Negative Negative   Microscopic Examination See below:       Assessment & Plan:   1. Myalgia - CMP14+EGFR  2. Blood loss - CBC with Differential/Platelet  3. Nephrolithiasis - Urinalysis, Complete - Microscopic Examination Concern for infection  4. Well adult exam - CBC with Differential/Platelet - CMP14+EGFR - Lipid panel - TSH - PSA  5. Influenza Treated with normal saline 1 unit in office  Continue all other maintenance medications as listed above.  Follow up plan: No Follow-up on file.  Educational handout given for [place] PA-C [place] [address]  [place], [place] [address] [phone]   05/16/2017, 2:31 PM

## 2017-05-17 ENCOUNTER — Telehealth: Payer: Self-pay | Admitting: Family Medicine

## 2017-05-17 LAB — CBC WITH DIFFERENTIAL/PLATELET
BASOS: 0 %
Basophils Absolute: 0 10*3/uL (ref 0.0–0.2)
EOS (ABSOLUTE): 0.1 10*3/uL (ref 0.0–0.4)
EOS: 1 %
HEMATOCRIT: 46.5 % (ref 37.5–51.0)
HEMOGLOBIN: 15.3 g/dL (ref 13.0–17.7)
IMMATURE GRANS (ABS): 0 10*3/uL (ref 0.0–0.1)
Immature Granulocytes: 0 %
LYMPHS ABS: 2.3 10*3/uL (ref 0.7–3.1)
LYMPHS: 28 %
MCH: 32.3 pg (ref 26.6–33.0)
MCHC: 32.9 g/dL (ref 31.5–35.7)
MCV: 98 fL — ABNORMAL HIGH (ref 79–97)
MONOCYTES: 8 %
Monocytes Absolute: 0.6 10*3/uL (ref 0.1–0.9)
NEUTROS ABS: 5.2 10*3/uL (ref 1.4–7.0)
Neutrophils: 63 %
Platelets: 239 10*3/uL (ref 150–379)
RBC: 4.74 x10E6/uL (ref 4.14–5.80)
RDW: 14.1 % (ref 12.3–15.4)
WBC: 8.2 10*3/uL (ref 3.4–10.8)

## 2017-05-17 LAB — PSA: Prostate Specific Ag, Serum: 0.6 ng/mL (ref 0.0–4.0)

## 2017-05-17 LAB — TSH: TSH: 1.68 u[IU]/mL (ref 0.450–4.500)

## 2017-05-17 LAB — CMP14+EGFR
ALBUMIN: 4.7 g/dL (ref 3.5–4.8)
ALT: 8 IU/L (ref 0–44)
AST: 16 IU/L (ref 0–40)
Albumin/Globulin Ratio: 2 (ref 1.2–2.2)
Alkaline Phosphatase: 55 IU/L (ref 39–117)
BILIRUBIN TOTAL: 0.6 mg/dL (ref 0.0–1.2)
BUN/Creatinine Ratio: 16 (ref 10–24)
BUN: 15 mg/dL (ref 8–27)
CHLORIDE: 103 mmol/L (ref 96–106)
CO2: 22 mmol/L (ref 20–29)
CREATININE: 0.96 mg/dL (ref 0.76–1.27)
Calcium: 9.3 mg/dL (ref 8.6–10.2)
GFR calc non Af Amer: 78 mL/min/{1.73_m2} (ref >59)
GFR, EST AFRICAN AMERICAN: 90 mL/min/{1.73_m2} (ref >59)
GLOBULIN, TOTAL: 2.3 g/dL (ref 1.5–4.5)
GLUCOSE: 115 mg/dL — AB (ref 65–99)
Potassium: 4.5 mmol/L (ref 3.5–5.2)
SODIUM: 141 mmol/L (ref 134–144)
TOTAL PROTEIN: 7 g/dL (ref 6.0–8.5)

## 2017-05-17 LAB — LIPID PANEL
Chol/HDL Ratio: 4.3 ratio (ref 0.0–5.0)
Cholesterol, Total: 176 mg/dL (ref 100–199)
HDL: 41 mg/dL (ref >39)
LDL CALC: 95 mg/dL (ref 0–99)
Triglycerides: 202 mg/dL — ABNORMAL HIGH (ref 0–149)
VLDL CHOLESTEROL CAL: 40 mg/dL (ref 5–40)

## 2017-05-17 LAB — URINE CULTURE

## 2017-05-17 NOTE — Telephone Encounter (Signed)
Tylenol 2 tabs up to 3 times daily ios ok to use.    Laroy Apple, MD [facility] 05/17/2017, 5:51 PM

## 2017-05-17 NOTE — Telephone Encounter (Signed)
Patient aware.

## 2017-05-21 ENCOUNTER — Telehealth: Payer: Self-pay | Admitting: Family Medicine

## 2017-05-21 NOTE — Telephone Encounter (Signed)
Pt having continued leg and back pain appt scheduled

## 2017-05-22 ENCOUNTER — Ambulatory Visit (INDEPENDENT_AMBULATORY_CARE_PROVIDER_SITE_OTHER): Payer: Medicare Other | Admitting: Family Medicine

## 2017-05-22 ENCOUNTER — Encounter: Payer: Self-pay | Admitting: Family Medicine

## 2017-05-22 ENCOUNTER — Ambulatory Visit (INDEPENDENT_AMBULATORY_CARE_PROVIDER_SITE_OTHER): Payer: Medicare Other

## 2017-05-22 VITALS — BP 172/85 | HR 88 | Temp 97.6°F | Ht 70.0 in | Wt 211.4 lb

## 2017-05-22 DIAGNOSIS — M5410 Radiculopathy, site unspecified: Secondary | ICD-10-CM | POA: Diagnosis not present

## 2017-05-22 DIAGNOSIS — I10 Essential (primary) hypertension: Secondary | ICD-10-CM

## 2017-05-22 DIAGNOSIS — M545 Low back pain: Secondary | ICD-10-CM | POA: Diagnosis not present

## 2017-05-22 MED ORDER — METHYLPREDNISOLONE ACETATE 80 MG/ML IJ SUSP
80.0000 mg | Freq: Once | INTRAMUSCULAR | Status: AC
  Administered 2017-05-22: 80 mg via INTRAMUSCULAR

## 2017-05-22 NOTE — Patient Instructions (Signed)
Great to see you!  I think its a good idea to restart the amlodipine  Come back in 3-4 weeks to see how you are doing and to look at your blood pressure.

## 2017-05-22 NOTE — Progress Notes (Signed)
   HPI  Patient presents today here with back pain.  Patient explains he has had back pain now for about 3 weeks. He states that he has had a rocky course recently with a sinus infection, then a reaction to Tamiflu.  He states he was told to stop the Tamiflu and is sleeping improved.  He was then seen by a partner in our practice with muscle pain, hematuria, and weakness.  Patient's now been on Cipro and states that he feels that medicine is causing much of the weakness.  She states that he has missed work last week and does not feel like he can go back this week, he would like to go back next Monday.  Pain is different than his typical renal stone pain.  Patient states he has had a long-standing 1.6 cm renal stone in the right kidney.  He is status post left nephrectomy due to renal cancer.  PMH: Smoking status noted ROS: Per HPI  Objective: BP (!) 172/85   Pulse 88   Temp 97.6 F (36.4 C) (Oral)   Ht 5\' 10"  (1.778 m)   Wt 211 lb 6.4 oz (95.9 kg)   SpO2 97%   BMI 30.33 kg/m  Gen: NAD, alert, cooperative with exam HEENT: NCAT, EOMI, PERRL CV: RRR, good S1/S2, no murmur Resp: CTABL, no wheezes, non-labored Ext: No edema, warm Neuro: Alert and oriented, 2+ patella tendon reflexes bilaterally, strength 5/5 and sensation intact bilateral lower extremities MSK Tenderness to palpation over the right SI joint, negative straight leg raise bilaterally  Assessment and plan:  #Radicular low back pain Right-sided low back pain with radiation down the right lateral leg He does have tenderness over the SI joint Given IM Depo-Medrol. Plain film Note written for work  #Hypertension Blood pressure very elevated today, confirmed with manual cuff myself. Blood pressure also elevated last visit Recommend restart amlodipine which she has at home, follow-up 3-4 weeks   Orders Placed This Encounter  Procedures  . DG Lumbar Spine 2-3 Views    Standing Status:   Future    Number of  Occurrences:   1    Standing Expiration Date:   07/22/2018    Order Specific Question:   Reason for Exam (SYMPTOM  OR DIAGNOSIS REQUIRED)    Answer:   low back pain X 3 weeks, R sided radiculopathy    Order Specific Question:   Preferred imaging location?    Answer:   Internal    Meds ordered this encounter  Medications  . methylPREDNISolone acetate (DEPO-MEDROL) injection 80 mg    Laroy Apple, MD [facility] 05/22/2017, 11:56 AM

## 2017-05-28 ENCOUNTER — Ambulatory Visit (INDEPENDENT_AMBULATORY_CARE_PROVIDER_SITE_OTHER): Payer: Medicare Other | Admitting: Family Medicine

## 2017-05-28 ENCOUNTER — Encounter: Payer: Self-pay | Admitting: Family Medicine

## 2017-05-28 VITALS — BP 184/90 | HR 65 | Temp 98.0°F | Ht 70.0 in | Wt 207.8 lb

## 2017-05-28 DIAGNOSIS — M5410 Radiculopathy, site unspecified: Secondary | ICD-10-CM | POA: Diagnosis not present

## 2017-05-28 MED ORDER — PREDNISONE 10 MG PO TABS: ORAL_TABLET | ORAL | 0 refills | Status: DC

## 2017-05-28 MED ORDER — TRAMADOL HCL 50 MG PO TABS: 50.0000 mg | ORAL_TABLET | Freq: Four times a day (QID) | ORAL | 0 refills | Status: DC | PRN

## 2017-05-28 NOTE — Progress Notes (Signed)
   HPI  Patient presents today here for continued back pain.  Patient reports right-sided low back pain that radiates down his right lateral leg and wraps around to his right medial leg and down his right medial lower leg as well.  He has increased pain at night when he is trying to relax.  She states that he has had a very negative response to oxycodone previously which kept him up all night.  He states he did actually improved for 2 or 3 days after IM Depo-Medrol.    PMH: Smoking status noted ROS: Per HPI  Objective: BP (!) 184/90   Pulse 65   Temp 98 F (36.7 C) (Oral)   Ht 5\' 10"  (1.778 m)   Wt 207 lb 12.8 oz (94.3 kg)   BMI 29.82 kg/m  Gen: NAD, alert, cooperative with exam HEENT: NCAT CV: RRR, good S1/S2 Resp: CTABL, no wheezes, non-labored Ext: No edema, warm Neuro: Alert and oriented, No gross deficits MSK Tenderness over the right SI joint No midline tenderness Negative logroll, Corky Sox, and Fadir on R  Negative straight leg raise  Assessment and plan:  #Radicular low back pain Patient with right-sided low back pain consistent with SI joint pain, however some radicular distribution down the leg. Patient did have improvement with IM steroids, given longer course of prednisone orally, also given tramadol for pain relief. Patient has had adverse effect with previous oxycodone given.     Meds ordered this encounter  Medications  . predniSONE (DELTASONE) 10 MG tablet    Sig: Take 4 pills a day for 3 days, then 3 pills a day for 3 days, then 2 pills a day for 3 days, then 1 pill a day for 3 days, then stop    Dispense:  30 tablet    Refill:  0  . traMADol (ULTRAM) 50 MG tablet    Sig: Take 1 tablet (50 mg total) by mouth every 6 (six) hours as needed.    Dispense:  30 tablet    Refill:  0    Laroy Apple, MD [facility] 05/28/2017, 4:42 PM

## 2017-05-29 ENCOUNTER — Ambulatory Visit: Payer: Medicare Other | Admitting: Family Medicine

## 2017-05-31 ENCOUNTER — Ambulatory Visit (INDEPENDENT_AMBULATORY_CARE_PROVIDER_SITE_OTHER): Payer: Medicare Other | Admitting: *Deleted

## 2017-05-31 ENCOUNTER — Encounter: Payer: Self-pay | Admitting: *Deleted

## 2017-05-31 VITALS — BP 174/93 | HR 80 | Ht 67.5 in | Wt 208.0 lb

## 2017-05-31 DIAGNOSIS — Z0000 Encounter for general adult medical examination without abnormal findings: Secondary | ICD-10-CM

## 2017-05-31 NOTE — Patient Instructions (Addendum)
   [name],  Thank you for taking time to come for your Medicare Wellness Visit. I appreciate your ongoing commitment to your health goals. Please review the following plan we discussed and let me know if I can assist you in the future.   These are the goals we discussed: Goals    . Exercise 150 min/wk Moderate Activity       This is a list of the screening recommended for you and due dates:  Health Maintenance  Topic Date Due  . Tetanus Vaccine  01/19/1962  . Colon Cancer Screening  01/19/1993  . Pneumonia vaccines (1 of 2 - PCV13) 01/20/2008  . Flu Shot  01/12/2018*  *Topic was postponed. The date shown is not the original due date.   Consider vaccines once you're feeling better Colace or docusate sodium (stool softener) up to 3 pills a day to help with constipation Probiotic

## 2017-05-31 NOTE — Progress Notes (Addendum)
Subjective:   Jesse Barton. is a 77 y.o. male who presents for an Initial Medicare Annual Wellness Visit. Jesse Barton is married and lives at home with his wife and small dog. They have one adult son and one adult daughter and one granddaughter.   Review of Systems  Current health is not as good as last year. He is not back to baseline and is still having some difficulty with lower extremity weakness.   Cardiac Risk Factors include: advanced age (>77men, >82 women);male gender;hypertension    Objective:    Today's Vitals   05/31/17 1121 05/31/17 1214  BP: (!) 199/103 (!) 174/93  Pulse: 84 80  Weight: 208 lb (94.3 kg)   Height: 5' 7.5" (1.715 m)    Body mass index is 32.1 kg/m.  No flowsheet data found.  Current Medications (verified) Outpatient Encounter Medications as of 05/31/2017  Medication Sig  . amLODipine (NORVASC) 5 MG tablet Take 5 mg by mouth daily.  . predniSONE (DELTASONE) 10 MG tablet Take 4 pills a day for 3 days, then 3 pills a day for 3 days, then 2 pills a day for 3 days, then 1 pill a day for 3 days, then stop  . traMADol (ULTRAM) 50 MG tablet Take 1 tablet (50 mg total) by mouth every 6 (six) hours as needed.  . [DISCONTINUED] amLODipine (NORVASC) 5 MG tablet Take 1 tablet (5 mg total) by mouth daily.   No facility-administered encounter medications on file as of 05/31/2017.     Allergies (verified) Ciprofloxacin and Tamiflu [oseltamivir]   History: Past Medical History:  Diagnosis Date  . Cancer ([place])    Kidney  . Hypertension    Past Surgical History:  Procedure Laterality Date  . EYE SURGERY Bilateral    cataracts  . KIDNEY SURGERY    . NEPHRECTOMY Left    Family History  Problem Relation Age of Onset  . Cancer Mother   . Leukemia Father   . Schizophrenia Sister   . Cancer Sister 73       kidney   Social History   Socioeconomic History  . Marital status: Married    Spouse name: Not on file  . Number of children: 2  . Years of  education: 67  . Highest education level: Some college, no degree  Social Needs  . Financial resource strain: Not hard at all  . Food insecurity - worry: Never true  . Food insecurity - inability: Never true  . Transportation needs - medical: No  . Transportation needs - non-medical: No  Occupational History  . Not on file  Tobacco Use  . Smoking status: Light Tobacco Smoker    Types: Cigars  . Smokeless tobacco: Never Used  Substance and Sexual Activity  . Alcohol use: Yes    Comment: occasional  . Drug use: No  . Sexual activity: Not on file  Other Topics Concern  . Not on file  Social History Narrative  . Not on file   Tobacco Counseling Ready to quit: Not Answered Counseling given: Not Answered     Activities of Daily Living In your present state of health, do you have any difficulty performing the following activities: 05/31/2017  Hearing? N  Vision? N  Comment Cataract surgery in both eyes last year. Recent eye exam.   Difficulty concentrating or making decisions? N  Walking or climbing stairs? Y  Comment continues to have some weakness since reaction to Tamiflu recently  Dressing  or bathing? N  Doing errands, shopping? N  Preparing Food and eating ? N  Using the Toilet? N  In the past six months, have you accidently leaked urine? N  Do you have problems with loss of bowel control? N  Managing your Medications? N  Managing your Finances? N  Housekeeping or managing your Housekeeping? N  Some recent data might be hidden     Immunizations and Health Maintenance Immunization History  Administered Date(s) Administered  . Influenza,inj,Quad PF,6+ Mos 03/11/2015   Health Maintenance Due  Topic Date Due  . TETANUS/TDAP  01/19/1962  . COLONOSCOPY  01/19/1993  . PNA vac Low Risk Adult (1 of 2 - PCV13) 01/20/2008    Patient Care Team: Timmothy Euler, MD as PCP - General (Family Medicine)  No hospitalizations, ER visits, or surgeries this past year.       Assessment:   This is a routine wellness examination for Jesse Barton.  Hearing/Vision screen No deficits noted during visit.   Dietary issues and exercise activities discussed: Current Exercise Habits: The patient does not participate in regular exercise at present   Diet Drinks 3 bottles of water a day Eats 3 meals a day but has a decreased appetite since not feeling well  Goals    . Exercise 150 min/wk Moderate Activity      Depression Screen PHQ 2/9 Scores 05/31/2017 05/28/2017 05/22/2017 05/16/2017  PHQ - 2 Score 0 0 0 0    Fall Risk Fall Risk  05/31/2017 05/28/2017 05/22/2017 05/16/2017 05/12/2017  Falls in the past year? Yes Yes Yes Yes No  Number falls in past yr: 2 or more 2 or more 2 or more 2 or more -  Injury with Fall? Yes Yes Yes Yes -  Risk Factor Category  High Fall Risk - - - -  Follow up Falls prevention discussed - - - -    Cognitive Function: MMSE - Mini Mental State Exam 05/31/2017  Orientation to time 5  Orientation to Place 5  Registration 3  Attention/ Calculation 5  Recall 1  Language- name 2 objects 2  Language- repeat 1  Language- follow 3 step command 3  Language- read & follow direction 1  Write a sentence 1  Copy design 1  Total score 28        Screening Tests Health Maintenance  Topic Date Due  . TETANUS/TDAP  01/19/1962  . COLONOSCOPY  01/19/1993  . PNA vac Low Risk Adult (1 of 2 - PCV13) 01/20/2008  . INFLUENZA VACCINE  01/12/2018 (Originally 10/18/2016)       Plan:  Keep f/u with PCP Increase activity level as tolerated. Work up to at least 150 min of moderate activity a week.  Consider vaccines once you're feeling better Docusate sodium 1 to 3 pills a day to help with constipation Probiotic   I have personally reviewed and noted the following in the patient's chart:   . Medical and social history . Use of alcohol, tobacco or illicit drugs  . Current medications and supplements . Functional ability and status . Nutritional  status . Physical activity . Advanced directives . List of other physicians . Hospitalizations, surgeries, and ER visits in previous 12 months . Vitals . Screenings to include cognitive, depression, and falls . Referrals and appointments  In addition, I have reviewed and discussed with patient certain preventive protocols, quality metrics, and best practice recommendations. A written personalized care plan for preventive services as well as general preventive health recommendations  were provided to patient.     Chong Sicilian, RN   06/01/2017   I have reviewed and agree with the above AWV documentation.   Laroy Apple, MD [facility] 06/01/2017, 11:56 AM

## 2017-06-14 ENCOUNTER — Ambulatory Visit (INDEPENDENT_AMBULATORY_CARE_PROVIDER_SITE_OTHER): Payer: Medicare Other | Admitting: Family Medicine

## 2017-06-14 ENCOUNTER — Encounter: Payer: Self-pay | Admitting: Family Medicine

## 2017-06-14 VITALS — BP 153/83 | HR 95 | Temp 98.1°F | Ht 67.5 in | Wt 201.4 lb

## 2017-06-14 DIAGNOSIS — M545 Low back pain: Secondary | ICD-10-CM | POA: Diagnosis not present

## 2017-06-14 MED ORDER — NAPROXEN 375 MG PO TABS: 375.0000 mg | ORAL_TABLET | Freq: Two times a day (BID) | ORAL | 0 refills | Status: DC

## 2017-06-14 NOTE — Progress Notes (Signed)
   HPI  Patient presents today for continued back pain.  Patient explains that his back pain improved slightly with the prednisone, however it has continued.  Patient states he has had symptoms for about 3 weeks, right-sided low back pain with some leg symptoms as well, however no obvious actual radiation of pain down the leg.  Patient also has right knee pain and right lower leg pain that feels a little more sensitive than usual.  Patient also complains of some fatigue.  PMH: Smoking status noted ROS: Per HPI  Objective: BP (!) 153/83   Pulse 95   Temp 98.1 F (36.7 C) (Oral)   Ht 5' 7.5" (1.715 m)   Wt 201 lb 6.4 oz (91.4 kg)   BMI 31.08 kg/m  Gen: NAD, alert, cooperative with exam HEENT: NCAT CV: RRR, good S1/S2, no murmur Resp: CTABL, no wheezes, non-labored Ext: No edema, warm Neuro: Alert and oriented, No gross deficits  MSK Tenderness to palpation of the right-sided paraspinal muscles, right SI joint, and midline lumbar spine, no left-sided pain. Negative monofilament straight leg raise   Assessment and plan:  #Right-sided low back pain Continued, mildly improved with IM Depo-Medrol, then long course of prednisone Tramadol caused nervousness without much pain relief. Patient does not do well with narcotics. Schedule a very short course of NSAIDs, refer to orthopedics    Laroy Apple, MD [facility] 06/14/2017, 2:14 PM

## 2017-06-14 NOTE — Patient Instructions (Signed)
Great to see you!  Try naproxen 1 pill twice daily, do not take ibuprofen or Aleve with this medication.  You may take Tylenol as needed still.  We will work on a referral to orthopedic surgery for you.

## 2017-06-28 ENCOUNTER — Encounter (INDEPENDENT_AMBULATORY_CARE_PROVIDER_SITE_OTHER): Payer: Self-pay | Admitting: Orthopaedic Surgery

## 2017-06-28 ENCOUNTER — Ambulatory Visit (INDEPENDENT_AMBULATORY_CARE_PROVIDER_SITE_OTHER): Payer: Medicare Other | Admitting: Orthopaedic Surgery

## 2017-06-28 VITALS — BP 153/86 | HR 71 | Ht 69.0 in | Wt 201.0 lb

## 2017-06-28 DIAGNOSIS — M5136 Other intervertebral disc degeneration, lumbar region: Secondary | ICD-10-CM

## 2017-06-28 NOTE — Progress Notes (Signed)
Office Visit Note   Patient: Jesse Barton.           Date of Birth: 1941/01/04           MRN: [id number] Visit Date: 06/28/2017              Requested by: Timmothy Euler, MD [place],  [id number] PCP: Timmothy Euler, MD   Assessment & Plan: Visit Diagnoses:  1. Other intervertebral disc degeneration, lumbar region     Plan: X-rays were reviewed he has some disc degeneration at L4-5 and L5-S1 without acute fracture.  Symptoms are not consistent with his history of kidney stones.  He will continue ambulating with a cane we discussed fall prevention.  Occasion symptoms presently.  If he has persistent symptoms call us and we can consider MRI imaging.  He may have some lateral recess stenosis on the right side that is flared after his fall in March but he is getting slowly better.  Continue to work on some leg strengthening exercises.  Follow-Up Instructions: Return if symptoms worsen or fail to improve.   Orders:  No orders of the defined types were placed in this encounter.  No orders of the defined types were placed in this encounter.     Procedures: No procedures performed   Clinical Data: No additional findings.   Subjective: Chief Complaint  Patient presents with  . Lower Back - Pain    HPI 77 year old male with back pain right leg pain burning down to his ankle that started after Tamiflu shot with reaction causing his legs to give out causing him to fall on May 20, 2017.  X-rays were obtained at his family practice clinic at [facility] .  He does have a history of kidney stones did have a large stone in the renal pelvis.  He has been treated with the use of a cane, Naprosyn, prednisone pack, IM Depo-Medrol.  He is slightly better still has not been able to walk up and down steps.  He denies chills or fever no hematuria no bowel or bladder associated symptoms.  Review of Systems positive for impaired fasting glucose, hypertension,  hyperlipidemia, history of kidney stones.  He has had attempted lithotripsy unsuccessful in breaking up the large stone.  Positive for cataracts hypertension.   Objective: Vital Signs: BP (!) 153/86   Pulse 71   Ht 5\' 9"  (1.753 m)   Wt 201 lb (91.2 kg)   BMI 29.68 kg/m   Physical Exam  Constitutional: He is oriented to person, place, and time. He appears well-developed and well-nourished.  HENT:  Head: Normocephalic and atraumatic.  Eyes: Pupils are equal, round, and reactive to light. EOM are normal.  Neck: No tracheal deviation present. No thyromegaly present.  Cardiovascular: Normal rate.  Pulmonary/Chest: Effort normal. He has no wheezes.  Abdominal: Soft. Bowel sounds are normal.  Neurological: He is alert and oriented to person, place, and time.  Skin: Skin is warm and dry. Capillary refill takes less than 2 seconds.  Psychiatric: He has a normal mood and affect. His behavior is normal. Judgment and thought content normal.    Ortho Exam patient ambulates with a short stride gait.  He has to use his hands to get up on a step.  Getting on and off the exam table he helped lift his right hip with flexion grabbing his thigh.  Quad hip flexor is strong in sitting position.  Her tib gastrocsoleus is intact.  Knee and ankle jerk are normal.  Negative straight leg raising.  Minimal discomfort with internal/external rotation of his hips.  Pedal pulses are palpable. Specialty Comments:  No specialty comments available.  Imaging: CLINICAL DATA:  Low back pain for 3 weeks, some radiation to the right leg  LUMBAR SPINE - 2-3 VIEW  COMPARISON:  None.  FINDINGS: The lumbar vertebrae are normal alignment. There is mild degenerative disc disease at L4-5 and L5-S1 where there is slight loss of disc space with sclerosis and spurring present. No compression deformity is seen. The SI joints are corticated. However, there do appear to be right renal calculi present in addition there is a  oval calcification medial to the right kidney of 19 mm in diameter. This could represent a right renal pelvic or proximal right ureteral calculus. CT of the abdomen pelvis may be helpful to assess further if warranted.  IMPRESSION: 1. Normal alignment of the lumbar vertebrae with degenerative disc disease at L4-5 and L5-S1. 2. Right renal calculi with possible right renal pelvic or proximal right ureteral calculus of 19 mm in diameter. Consider CT of the abdomen pelvis to assess further if warranted clinically.   Electronically Signed   By: Ivar Drape M.D.   On: 05/22/2017 15:48       PMFS History: Patient Active Problem List   Diagnosis Date Noted  . Nephrolithiasis 05/16/2017  . Influenza 05/12/2017  . Acute recurrent frontal sinusitis 05/12/2017  . Impaired fasting glucose 04/08/2015  . HTN (hypertension) 03/11/2015  . Left ankle pain 03/11/2015  . HLD (hyperlipidemia) 03/11/2015   Past Medical History:  Diagnosis Date  . Cancer ([place])    Kidney  . Hypertension     Family History  Problem Relation Age of Onset  . Cancer Mother   . Leukemia Father   . Schizophrenia Sister   . Cancer Sister 66       kidney    Past Surgical History:  Procedure Laterality Date  . EYE SURGERY Bilateral    cataracts  . KIDNEY SURGERY    . NEPHRECTOMY Left    Social History   Occupational History  . Not on file  Tobacco Use  . Smoking status: Light Tobacco Smoker    Types: Cigars  . Smokeless tobacco: Never Used  Substance and Sexual Activity  . Alcohol use: Yes    Comment: occasional  . Drug use: No  . Sexual activity: Not on file

## 2017-06-29 ENCOUNTER — Encounter (INDEPENDENT_AMBULATORY_CARE_PROVIDER_SITE_OTHER): Payer: Self-pay | Admitting: Orthopaedic Surgery

## 2017-08-17 ENCOUNTER — Other Ambulatory Visit: Payer: Self-pay | Admitting: Family Medicine

## 2017-08-17 DIAGNOSIS — I10 Essential (primary) hypertension: Secondary | ICD-10-CM

## 2017-10-22 ENCOUNTER — Encounter: Payer: Self-pay | Admitting: Nurse Practitioner

## 2017-10-22 ENCOUNTER — Ambulatory Visit (INDEPENDENT_AMBULATORY_CARE_PROVIDER_SITE_OTHER): Payer: Medicare Other | Admitting: Nurse Practitioner

## 2017-10-22 VITALS — BP 160/82 | HR 42 | Temp 97.5°F | Ht 69.0 in | Wt 212.0 lb

## 2017-10-22 DIAGNOSIS — H05221 Edema of right orbit: Secondary | ICD-10-CM

## 2017-10-22 DIAGNOSIS — G8929 Other chronic pain: Secondary | ICD-10-CM | POA: Diagnosis not present

## 2017-10-22 DIAGNOSIS — M5441 Lumbago with sciatica, right side: Secondary | ICD-10-CM

## 2017-10-22 MED ORDER — CEFTRIAXONE SODIUM 1 G IJ SOLR
1.0000 g | Freq: Once | INTRAMUSCULAR | Status: AC
  Administered 2017-10-22: 1 g via INTRAMUSCULAR

## 2017-10-22 MED ORDER — CEPHALEXIN 500 MG PO CAPS: 500.0000 mg | ORAL_CAPSULE | Freq: Three times a day (TID) | ORAL | 0 refills | Status: DC

## 2017-10-22 NOTE — Progress Notes (Signed)
   Subjective:    Patient ID: Jesse Mane., male    DOB: 07-28-1940, 77 y.o.   MRN: [id number]   Chief Complaint: right eye swollen (Started Saturday)   HPI Patient come sin today c/o right eye swelling. This started on Saturday. He is not sure if he was bitten by something or not. He does have a cut above his right.  * he ia also c/o pf low back pain that has been going on for several months. He has had cortisone injection and oralprednisone. No better. Pain still rated 8/10 and radiates down right leg.  Review of Systems  Constitutional: Negative.   Respiratory: Negative.   Cardiovascular: Negative.   Genitourinary: Negative.   Neurological: Negative.   Psychiatric/Behavioral: Negative.   All other systems reviewed and are negative.      Objective:   Physical Exam  Constitutional: He is oriented to person, place, and time. He appears well-developed and well-nourished. No distress.  Eyes: Pupils are equal, round, and reactive to light. Conjunctivae and EOM are normal.  Right upper and lower lid are swollen and erythematous.  Cardiovascular: Normal rate and regular rhythm.  Pulmonary/Chest: Effort normal.  Musculoskeletal:  FROM of lumbar spine with pain on full flexion and rotation to left. (-) SLR bil   Neurological: He is alert and oriented to person, place, and time.  Skin: Skin is warm.     BP (!) 160/82   Pulse (!) 42   Temp (!) 97.5 F (36.4 C) (Oral)   Ht 5\' 9"  (1.753 m)   Wt 212 lb (96.2 kg)   BMI 31.31 kg/m         Assessment & Plan:  Jesse Mane. in today with chief complaint of right eye swollen (Started Saturday)   1. Chronic midline low back pain with right-sided sciatica Will wait on MRI results to decide what to do - MR Lumbar Spine Wo Contrast; Future  2. Orbital edema, right perioribtal edema usually requires IV antibiotics. Consulted with Arlis Porta ,PA and we decided that out[atient treatent may work for him. If eye is not 100x  better by tomorrow morning- need to return to office. Patient verbalizes understanding Cool compresses Avoid rubbing - cefTRIAXone (ROCEPHIN) injection 1 g - cephALEXin (KEFLEX) 500 MG capsule; Take 1 capsule (500 mg total) by mouth 3 (three) times daily.  Dispense: 30 capsule; Refill: 0  Mary-Margaret Hassell Done, FNP

## 2017-10-23 ENCOUNTER — Telehealth: Payer: Self-pay | Admitting: Nurse Practitioner

## 2017-10-23 ENCOUNTER — Other Ambulatory Visit: Payer: Self-pay

## 2017-10-23 ENCOUNTER — Emergency Department (HOSPITAL_COMMUNITY)
Admission: EM | Admit: 2017-10-23 | Discharge: 2017-10-23 | Disposition: A | Discharge status: Home / Self Care | Payer: Medicare Other | Attending: Emergency Medicine | Admitting: Emergency Medicine

## 2017-10-23 ENCOUNTER — Encounter (HOSPITAL_COMMUNITY): Payer: Self-pay | Admitting: Emergency Medicine

## 2017-10-23 DIAGNOSIS — Z79899 Other long term (current) drug therapy: Secondary | ICD-10-CM | POA: Diagnosis not present

## 2017-10-23 DIAGNOSIS — B029 Zoster without complications: Secondary | ICD-10-CM | POA: Diagnosis not present

## 2017-10-23 DIAGNOSIS — I10 Essential (primary) hypertension: Secondary | ICD-10-CM | POA: Diagnosis not present

## 2017-10-23 DIAGNOSIS — Z85528 Personal history of other malignant neoplasm of kidney: Secondary | ICD-10-CM | POA: Insufficient documentation

## 2017-10-23 DIAGNOSIS — F17290 Nicotine dependence, other tobacco product, uncomplicated: Secondary | ICD-10-CM | POA: Diagnosis not present

## 2017-10-23 DIAGNOSIS — R21 Rash and other nonspecific skin eruption: Secondary | ICD-10-CM | POA: Diagnosis not present

## 2017-10-23 HISTORY — DX: Calculus of kidney: N20.0

## 2017-10-23 LAB — CBC WITH DIFFERENTIAL/PLATELET
BASOS PCT: 1 %
Basophils Absolute: 0.1 10*3/uL (ref 0.0–0.1)
EOS ABS: 0.1 10*3/uL (ref 0.0–0.7)
Eosinophils Relative: 2 %
HCT: 42.2 % (ref 39.0–52.0)
Hemoglobin: 14.5 g/dL (ref 13.0–17.0)
LYMPHS PCT: 35 %
Lymphs Abs: 2.1 10*3/uL (ref 0.7–4.0)
MCH: 32.9 pg (ref 26.0–34.0)
MCHC: 34.4 g/dL (ref 30.0–36.0)
MCV: 95.7 fL (ref 78.0–100.0)
MONO ABS: 0.7 10*3/uL (ref 0.1–1.0)
Monocytes Relative: 12 %
NEUTROS ABS: 3 10*3/uL (ref 1.7–7.7)
Neutrophils Relative %: 50 %
PLATELETS: 183 10*3/uL (ref 150–400)
RBC: 4.41 MIL/uL (ref 4.22–5.81)
RDW: 12.9 % (ref 11.5–15.5)
WBC: 5.8 10*3/uL (ref 4.0–10.5)

## 2017-10-23 LAB — COMPREHENSIVE METABOLIC PANEL
ALBUMIN: 4.2 g/dL (ref 3.5–5.0)
ALT: 14 U/L (ref 0–44)
ANION GAP: 7 (ref 5–15)
AST: 18 U/L (ref 15–41)
Alkaline Phosphatase: 50 U/L (ref 38–126)
BUN: 16 mg/dL (ref 8–23)
CALCIUM: 9.2 mg/dL (ref 8.9–10.3)
CHLORIDE: 107 mmol/L (ref 98–111)
CO2: 28 mmol/L (ref 22–32)
Creatinine, Ser: 1.02 mg/dL (ref 0.61–1.24)
GFR calc non Af Amer: >60 mL/min (ref >60)
GLUCOSE: 113 mg/dL — AB (ref 70–99)
POTASSIUM: 3.8 mmol/L (ref 3.5–5.1)
SODIUM: 142 mmol/L (ref 135–145)
Total Bilirubin: 0.9 mg/dL (ref 0.3–1.2)
Total Protein: 7 g/dL (ref 6.5–8.1)

## 2017-10-23 MED ORDER — AMLODIPINE BESYLATE 5 MG PO TABS
ORAL_TABLET | ORAL | Status: AC
  Filled 2017-10-23: qty 1

## 2017-10-23 MED ORDER — AMLODIPINE BESYLATE 5 MG PO TABS
5.0000 mg | ORAL_TABLET | Freq: Once | ORAL | Status: AC
  Administered 2017-10-23: 5 mg via ORAL

## 2017-10-23 MED ORDER — VALACYCLOVIR HCL 1 G PO TABS: ORAL_TABLET | ORAL | 0 refills | Status: DC

## 2017-10-23 MED ORDER — VALACYCLOVIR HCL 500 MG PO TABS
1000.0000 mg | ORAL_TABLET | Freq: Once | ORAL | Status: AC
  Administered 2017-10-23: 1000 mg via ORAL
  Filled 2017-10-23: qty 2

## 2017-10-23 NOTE — Telephone Encounter (Signed)
Needs to be seen

## 2017-10-23 NOTE — Discharge Instructions (Addendum)
Follow-up with your family doctor next week for recheck.  Continue taking your antibiotic.  Call your eye doctor today and set up an appointment for tomorrow or the next day to check your eye.

## 2017-10-23 NOTE — ED Triage Notes (Signed)
Patient has rash around right eye and forehead with swelling noted to right eye x 4 days. States he was seen at [facility] yesterday and was given antibiotics.

## 2017-10-23 NOTE — ED Notes (Signed)
Pt's bp elevated.  Pt says feels "fine."  Pt says he takes norvasc 5mg  around 10pm every night.  Notified Dr. Roderic Palau and he ordered for pt to have norvasc now.

## 2017-10-23 NOTE — ED Provider Notes (Signed)
[facility] Provider Note   CSN: [id number] Arrival date & time: 10/23/17  1013     History   Chief Complaint Chief Complaint  Patient presents with  . Rash    HPI Jesse Barton. is a 77 y.o. male.  Patient started with a rash to his forehead 3 days ago.  He was seen by his doctor and started on Keflex.  Patient states the rash seems to be getting worse.  Patient states that he does have a mild to moderate discomfort in his forehead.  Patient is having no eye pain or visual problems.  The history is provided by the patient. No language interpreter was used.  Rash   This is a new problem. The current episode started more than 2 days ago. The problem has not changed since onset.The problem is associated with nothing. There has been no fever. Affected Location: Rash to forehead. The pain is at a severity of 3/10. The pain is mild. The pain has been constant since onset. Associated symptoms include blisters. Treatments tried: Antibiotics. Risk factors include new medications.    Past Medical History:  Diagnosis Date  . Cancer ([place])    Kidney  . Hypertension   . Kidney stone     Patient Active Problem List   Diagnosis Date Noted  . Nephrolithiasis 05/16/2017  . Influenza 05/12/2017  . Acute recurrent frontal sinusitis 05/12/2017  . Impaired fasting glucose 04/08/2015  . HTN (hypertension) 03/11/2015  . Left ankle pain 03/11/2015  . HLD (hyperlipidemia) 03/11/2015    Past Surgical History:  Procedure Laterality Date  . CATARACT EXTRACTION    . EYE SURGERY Bilateral    cataracts  . KIDNEY SURGERY    . NEPHRECTOMY Left         Home Medications    Prior to Admission medications   Medication Sig Start Date End Date Taking? Authorizing Provider  acetaminophen (TYLENOL) 500 MG tablet Take 1,000 mg by mouth every 6 (six) hours as needed.    Yes [provider]  amLODipine (NORVASC) 5 MG tablet Take 5 mg by mouth daily.   Yes [provider]  cephALEXin (KEFLEX) 500 MG capsule Take 1 capsule (500 mg total) by mouth 3 (three) times daily. 10/22/17  Yes Martin, Mary-Margaret, FNP  valACYclovir (VALTREX) 1000 MG tablet Take 1 pill 3 times a day.  Start the medicine today. 10/23/17   Milton Ferguson, MD    Family History Family History  Problem Relation Age of Onset  . Cancer Mother   . Leukemia Father   . Schizophrenia Sister   . Cancer Sister 30       kidney    Social History Social History   Tobacco Use  . Smoking status: Light Tobacco Smoker    Types: Cigars  . Smokeless tobacco: Never Used  Substance Use Topics  . Alcohol use: Yes    Comment: occasional  . Drug use: No     Allergies   Ciprofloxacin and Tamiflu [oseltamivir]   Review of Systems Review of Systems  Constitutional: Negative for appetite change and fatigue.  HENT: Negative for congestion, ear discharge and sinus pressure.        Rash to forehead  Eyes: Negative for discharge.  Respiratory: Negative for cough.   Cardiovascular: Negative for chest pain.  Gastrointestinal: Negative for abdominal pain and diarrhea.  Genitourinary: Negative for frequency and hematuria.  Musculoskeletal: Negative for back pain.  Skin: Positive for rash.  Neurological: Negative  for seizures and headaches.  Psychiatric/Behavioral: Negative for hallucinations.     Physical Exam Updated Vital Signs BP (!) 199/71 (BP Location: Right Arm)   Pulse (!) 45   Temp 98.2 F (36.8 C)   Resp 18   Ht 5\' 9"  (1.753 m)   Wt 96.2 kg (212 lb)   SpO2 99%   BMI 31.31 kg/m   Physical Exam  Constitutional: He is oriented to person, place, and time. He appears well-developed.  HENT:  Head: Normocephalic.  Conjunctiva noninflamed.  Pupils equal reactive to light fundus sharp  Eyes: Conjunctivae and EOM are normal. No scleral icterus.  Neck: Neck supple. No thyromegaly present.  Cardiovascular: Normal rate and regular rhythm. Exam reveals no gallop and no  friction rub.  No murmur heard. Pulmonary/Chest: No stridor. He has no wheezes. He has no rales. He exhibits no tenderness.  Abdominal: He exhibits no distension. There is no tenderness. There is no rebound.  Musculoskeletal: Normal range of motion. He exhibits no edema.  Lymphadenopathy:    He has no cervical adenopathy.  Neurological: He is oriented to person, place, and time. He exhibits normal muscle tone. Coordination normal.  Skin: No rash noted. No erythema.  Psychiatric: He has a normal mood and affect. His behavior is normal.     ED Treatments / Results  Labs (all labs ordered are listed, but only abnormal results are displayed) Labs Reviewed  COMPREHENSIVE METABOLIC PANEL - Abnormal; Notable for the following components:      Result Value   Glucose, Bld 113 (*)    All other components within normal limits  CBC WITH DIFFERENTIAL/PLATELET    EKG None  Radiology No results found.  Procedures Procedures (including critical care time)  Medications Ordered in ED Medications  valACYclovir (VALTREX) tablet 1,000 mg (has no administration in time range)     Initial Impression / Assessment and Plan / ED Course  I have reviewed the triage vital signs and the nursing notes.  Pertinent labs & imaging results that were available during my care of the patient were reviewed by me and considered in my medical decision making (see chart for details).   Patient has shingles.  He is placed on Valtrex.  Exam does not show any eye involvement.  But patient will get a formal eye exam from his doctor in the next day.  He does not want any pain medicine.  He will continue his Keflex that his doctor started him on and he will follow-up with his primary care doctor next week   Final Clinical Impressions(s) / ED Diagnoses   Final diagnoses:  Herpes zoster without complication    ED Discharge Orders        Ordered    valACYclovir (VALTREX) 1000 MG tablet     10/23/17 1459        Milton Ferguson, MD 10/23/17 [id number]

## 2017-10-25 DIAGNOSIS — H5711 Ocular pain, right eye: Secondary | ICD-10-CM | POA: Diagnosis not present

## 2017-10-31 ENCOUNTER — Ambulatory Visit (HOSPITAL_COMMUNITY)
Admission: RE | Admit: 2017-10-31 | Discharge: 2017-10-31 | Disposition: A | Discharge status: Home / Self Care | Payer: Medicare Other | Source: Ambulatory Visit | Attending: Nurse Practitioner | Admitting: Nurse Practitioner

## 2017-10-31 DIAGNOSIS — M5441 Lumbago with sciatica, right side: Secondary | ICD-10-CM | POA: Insufficient documentation

## 2017-10-31 DIAGNOSIS — M47816 Spondylosis without myelopathy or radiculopathy, lumbar region: Secondary | ICD-10-CM | POA: Insufficient documentation

## 2017-10-31 DIAGNOSIS — M545 Low back pain: Secondary | ICD-10-CM | POA: Diagnosis not present

## 2017-10-31 DIAGNOSIS — M4807 Spinal stenosis, lumbosacral region: Secondary | ICD-10-CM | POA: Diagnosis not present

## 2017-10-31 DIAGNOSIS — G8929 Other chronic pain: Secondary | ICD-10-CM | POA: Insufficient documentation

## 2017-10-31 DIAGNOSIS — M48061 Spinal stenosis, lumbar region without neurogenic claudication: Secondary | ICD-10-CM | POA: Insufficient documentation

## 2017-11-05 ENCOUNTER — Other Ambulatory Visit: Payer: Self-pay | Admitting: Nurse Practitioner

## 2017-11-05 DIAGNOSIS — M48061 Spinal stenosis, lumbar region without neurogenic claudication: Secondary | ICD-10-CM

## 2017-11-05 NOTE — Progress Notes (Signed)
Ref neurosurgeon °

## 2017-11-27 DIAGNOSIS — M48062 Spinal stenosis, lumbar region with neurogenic claudication: Secondary | ICD-10-CM | POA: Diagnosis not present

## 2017-11-27 DIAGNOSIS — I10 Essential (primary) hypertension: Secondary | ICD-10-CM | POA: Diagnosis not present

## 2018-01-03 DIAGNOSIS — M48062 Spinal stenosis, lumbar region with neurogenic claudication: Secondary | ICD-10-CM | POA: Diagnosis not present

## 2018-02-07 DIAGNOSIS — M48062 Spinal stenosis, lumbar region with neurogenic claudication: Secondary | ICD-10-CM | POA: Diagnosis not present

## 2018-02-27 ENCOUNTER — Other Ambulatory Visit: Payer: Self-pay | Admitting: *Deleted

## 2018-02-27 MED ORDER — AMLODIPINE BESYLATE 5 MG PO TABS: 5.0000 mg | ORAL_TABLET | Freq: Every day | ORAL | 0 refills | Status: DC

## 2018-03-26 DIAGNOSIS — M48062 Spinal stenosis, lumbar region with neurogenic claudication: Secondary | ICD-10-CM | POA: Diagnosis not present

## 2018-04-03 ENCOUNTER — Other Ambulatory Visit: Payer: Self-pay | Admitting: Nurse Practitioner

## 2018-04-18 ENCOUNTER — Ambulatory Visit: Payer: Medicare Other | Attending: Anesthesiology | Admitting: Physical Therapy

## 2018-04-18 DIAGNOSIS — M5441 Lumbago with sciatica, right side: Secondary | ICD-10-CM | POA: Diagnosis not present

## 2018-04-18 DIAGNOSIS — G8929 Other chronic pain: Secondary | ICD-10-CM | POA: Diagnosis not present

## 2018-04-18 NOTE — Therapy (Signed)
[facility] [place], [place], [address] Phone: [phone]   Fax:  [phone]  Physical Therapy Evaluation  Patient Details  Name: Jesse Barton. MRN: [id number] Date of Birth: [birth year] Referring Provider (PT): Clydell Hakim MD   Encounter Date: 04/18/2018  PT End of Session - 04/18/18 1400    Visit Number  1    Number of Visits  12    Date for PT Re-Evaluation  07/17/18    PT Start Time  0100    PT Stop Time  0149    PT Time Calculation (min)  49 min    Activity Tolerance  Patient tolerated treatment well    Behavior During Therapy  [facility] for tasks assessed/performed       Past Medical History:  Diagnosis Date  . Cancer ([place])    Kidney  . Hypertension   . Kidney stone     Past Surgical History:  Procedure Laterality Date  . CATARACT EXTRACTION    . EYE SURGERY Bilateral    cataracts  . KIDNEY SURGERY    . NEPHRECTOMY Left     There were no vitals filed for this visit.   Subjective Assessment - 04/18/18 1327    Subjective  The patient reports ongoing right sided low back with radiation into his posterior thigh and on occasions to his left ankle region.  His pain today is a 4/10 but cane rise to a 7/10 with proloned standing and walking.  he has had two spinal injections which the first was very helpful.  He is better than he was a year ago.  he uses a cane for extra stability.      Pertinent History  Kidney removed, HTN.    Limitations  Standing;Walking    How long can you stand comfortably?  15 minutes.    How long can you walk comfortably?  Short community distances.    Diagnostic tests  X-ray and MRI.    Patient Stated Goals  Get out of pain and feel stronger.    Currently in Pain?  Yes    Pain Score  4     Pain Location  Back    Pain Orientation  Right    Pain Descriptors / Indicators  Aching    Pain Type  Chronic pain    Pain Onset  More than a month ago    Pain Frequency  Constant    Aggravating Factors   See above.    Pain Relieving Factors  See above.         [facility] PT Assessment - 04/18/18 0001      Assessment   Medical Diagnosis  Lumbar stenosis with neurogenic claudication.    Referring Provider (PT)  Clydell Hakim MD    Onset Date/Surgical Date  --   February 2019.     Precautions   Precautions  None      Restrictions   Weight Bearing Restrictions  No      Balance Screen   Has the patient fallen in the past 6 months  No    Has the patient had a decrease in activity level because of a fear of falling?   Yes    Is the patient reluctant to leave their home because of a fear of falling?   No      Home Film/video editor residence      Prior Function   Level of Independence  Independent  Observation/Other Assessments   Focus on Therapeutic Outcomes (FOTO)   62% limitation.      Posture/Postural Control   Posture/Postural Control  Postural limitations    Postural Limitations  Decreased lumbar lordosis;Flexed trunk      ROM / Strength   AROM / PROM / Strength  AROM;Strength      AROM   Overall AROM Comments  Active lumbar flexion limited by 25% and active lumbar extension to 10 degrees.      Strength   Overall Strength Comments  Bilateral ankle dorsiflexion= 4/5.  Patient states he was told her fractured his left ankle many years ago.  Knee strength= 4 to 4+/5.      Palpation   Palpation comment  Patient very tender to palpation over right lumbar erector spinae musculature and SIJ.      Special Tests   Other special tests  Diminished LE DTR's.  Pain reproduced with a right SLR test.  Equal leg lengths.      Ambulation/Gait   Gait Comments  The patient walks slow and cautious with a straight cane in some trunk flexion.                Objective measurements completed on examination: See above findings.      [facility] Adult PT Treatment/Exercise - 04/18/18 0001      Modalities   Modalities  Electrical  Stimulation;Moist Heat      Moist Heat Therapy   Number Minutes Moist Heat  15 Minutes    Moist Heat Location  Lumbar Spine      Electrical Stimulation   Electrical Stimulation Location  Right low back.    Electrical Stimulation Action  Pre-mod.    Electrical Stimulation Parameters  80-150 Hz x 15 minutes.    Electrical Stimulation Goals  Pain               PT Short Term Goals - 04/18/18 1428      PT SHORT TERM GOAL #1   Title  STG's=LTG's.        PT Long Term Goals - 04/18/18 1428      PT LONG TERM GOAL #1   Title  independent with a HEP.    Time  6    Period  Weeks    Status  New      PT LONG TERM GOAL #2   Title  Eliminate right LE symptoms.    Time  6    Period  Weeks    Status  New      PT LONG TERM GOAL #3   Title  Patient walk a community distnace with pain not > 2-3/10.    Time  6    Period  Weeks    Status  New      PT LONG TERM GOAL #4   Title  Patient stand 20 minutes with pain not > 2-3/10.    Time  6    Period  Weeks    Status  New             Plan - 04/18/18 1340    Clinical Impression Statement  The patient presents to [facility] with chronic right sided low back pain with radiation into his right posterior thigh and on occasions to his left ankle.  Pain increases with walking and satnding.  He is very tender to palption in his right low back musculature and SIJ.      History and Personal Factors relevant to plan of  care:  Kidney removed, HTN.    Clinical Presentation  Stable    Clinical Decision Making  Low    Rehab Potential  Good    PT Frequency  2x / week    PT Duration  6 weeks    PT Treatment/Interventions  ADLs/Self Care Home Management;Cryotherapy;Electrical Stimulation;Traction;Moist Heat;Ultrasound;Therapeutic exercise;Patient/family education;Manual techniques    PT Next Visit Plan  SKTC, DKTC, hip bridges.  Combo e'stim/U/S to right low back/SIJ f/b STW/M.  Core exercise progression.  Can begin intermittment lumbar traction  at 35% body weight progressing to 50% body weight per patient tolerance.    Consulted and Agree with Plan of Care  Patient       Patient will benefit from skilled therapeutic intervention in order to improve the following deficits and impairments:  Abnormal gait, Decreased activity tolerance, Pain, Postural dysfunction, Decreased range of motion  Visit Diagnosis: Chronic right-sided low back pain with right-sided sciatica - Plan: PT plan of care cert/re-cert     Problem List Patient Active Problem List   Diagnosis Date Noted  . Nephrolithiasis 05/16/2017  . Influenza 05/12/2017  . Acute recurrent frontal sinusitis 05/12/2017  . Impaired fasting glucose 04/08/2015  . HTN (hypertension) 03/11/2015  . Left ankle pain 03/11/2015  . HLD (hyperlipidemia) 03/11/2015    Jerardo Costabile, [place] MPT 04/18/2018, 2:31 PM  [facility] [address] [place], [place], [address] Phone: [phone]   Fax:  [phone]  Name: Dez Stauffer. MRN: [id number] Date of Birth: [birth year]

## 2018-04-22 ENCOUNTER — Encounter: Payer: Self-pay | Admitting: Physical Therapy

## 2018-04-22 ENCOUNTER — Ambulatory Visit: Payer: Medicare Other | Attending: Anesthesiology | Admitting: Physical Therapy

## 2018-04-22 DIAGNOSIS — M5441 Lumbago with sciatica, right side: Secondary | ICD-10-CM | POA: Insufficient documentation

## 2018-04-22 DIAGNOSIS — G8929 Other chronic pain: Secondary | ICD-10-CM

## 2018-04-22 NOTE — Therapy (Signed)
[facility] [place], [place], [address] Phone: [phone]   Fax:  [phone]  Physical Therapy Treatment  Patient Details  Name: Jesse Barton. MRN: [id number] Date of Birth: 01/03/1941 Referring Provider (PT): Clydell Hakim MD   Encounter Date: 04/22/2018  PT End of Session - 04/22/18 0815    Visit Number  2    Number of Visits  12    Date for PT Re-Evaluation  07/17/18    PT Start Time  0736    PT Stop Time  0821    PT Time Calculation (min)  45 min    Activity Tolerance  Patient tolerated treatment well;Patient limited by pain    Behavior During Therapy  [facility] for tasks assessed/performed       Past Medical History:  Diagnosis Date  . Cancer ([place])    Kidney  . Hypertension   . Kidney stone     Past Surgical History:  Procedure Laterality Date  . CATARACT EXTRACTION    . EYE SURGERY Bilateral    cataracts  . KIDNEY SURGERY    . NEPHRECTOMY Left     There were no vitals filed for this visit.  Subjective Assessment - 04/22/18 0739    Subjective  Patient arrived with soreness today    Pertinent History  Kidney removed, HTN.    Limitations  Standing;Walking    How long can you stand comfortably?  15 minutes.    How long can you walk comfortably?  Short community distances.    Diagnostic tests  X-ray and MRI.    Patient Stated Goals  Get out of pain and feel stronger.    Currently in Pain?  Yes    Pain Score  6     Pain Location  Back    Pain Orientation  Right    Pain Descriptors / Indicators  Aching    Pain Type  Chronic pain    Pain Onset  More than a month ago    Pain Frequency  Constant    Aggravating Factors   prolong standing    Pain Relieving Factors  at rest                       [facility] Adult PT Treatment/Exercise - 04/22/18 0001      Self-Care   Self-Care  ADL's;Lifting;Posture;Other Self-Care Comments    ADL's  work and home posture techniques    Lifting  power lift     Posture  all positions      Modalities   Modalities  Ultrasound;Traction      Ultrasound   Ultrasound Location  right SI    Ultrasound Parameters  combo US/ES @1 .5w/cm2/50%/1mhz x19min      Traction   Type of Traction  Lumbar    Min (lbs)  10    Max (lbs)  75    Hold Time  99    Rest Time  5    Time  15      Manual Therapy   Manual Therapy  Soft tissue mobilization    Manual therapy comments  attempted STW manual very gentle yet unable due to palpable pain too high per reported               PT Short Term Goals - 04/18/18 1428      PT SHORT TERM GOAL #1   Title  STG's=LTG's.        PT Long Term  Goals - 04/18/18 1428      PT LONG TERM GOAL #1   Title  independent with a HEP.    Time  6    Period  Weeks    Status  New      PT LONG TERM GOAL #2   Title  Eliminate right LE symptoms.    Time  6    Period  Weeks    Status  New      PT LONG TERM GOAL #3   Title  Patient walk a community distnace with pain not > 2-3/10.    Time  6    Period  Weeks    Status  New      PT LONG TERM GOAL #4   Title  Patient stand 20 minutes with pain not > 2-3/10.    Time  6    Period  Weeks    Status  New            Plan - 04/22/18 0818    Clinical Impression Statement  Patient arrived with ongoing pain in right SI today. Patient reported doing HEP as instructed by PT and has been using heat at home. Patient was educated on posture awareness techniques for all positions, work and home, power lift and logroll. Patient responded well to combo [place].ES and lumbar traction today. Patient unable to tolerate STW today due to palpable pain. Patient goals ongoing at this time.     Rehab Potential  Good    Clinical Impairments Affecting Rehab Potential  body weight 214#    PT Frequency  2x / week    PT Duration  6 weeks    PT Treatment/Interventions  ADLs/Self Care Home Management;Cryotherapy;Electrical Stimulation;Traction;Moist Heat;Ultrasound;Therapeutic  exercise;Patient/family education;Manual techniques    PT Next Visit Plan  assess traction and cont with POC for nustep and core progression / progress traction up to 50% per tolerance    Consulted and Agree with Plan of Care  Patient       Patient will benefit from skilled therapeutic intervention in order to improve the following deficits and impairments:  Abnormal gait, Decreased activity tolerance, Pain, Postural dysfunction, Decreased range of motion  Visit Diagnosis: Chronic right-sided low back pain with right-sided sciatica     Problem List Patient Active Problem List   Diagnosis Date Noted  . Nephrolithiasis 05/16/2017  . Influenza 05/12/2017  . Acute recurrent frontal sinusitis 05/12/2017  . Impaired fasting glucose 04/08/2015  . HTN (hypertension) 03/11/2015  . Left ankle pain 03/11/2015  . HLD (hyperlipidemia) 03/11/2015    Maximillion Gill P, PTA 04/22/2018, 8:32 AM  [facility] [address] [place], [place], [address] Phone: [phone]   Fax:  [phone]  Name: Jesse Barton. MRN: [id number] Date of Birth: January 22, 1941

## 2018-04-25 ENCOUNTER — Encounter: Payer: Self-pay | Admitting: Physical Therapy

## 2018-05-02 ENCOUNTER — Ambulatory Visit: Payer: Medicare Other | Admitting: Physical Therapy

## 2018-05-02 ENCOUNTER — Encounter: Payer: Self-pay | Admitting: Physical Therapy

## 2018-05-02 DIAGNOSIS — G8929 Other chronic pain: Secondary | ICD-10-CM

## 2018-05-02 DIAGNOSIS — M5441 Lumbago with sciatica, right side: Secondary | ICD-10-CM | POA: Diagnosis not present

## 2018-05-02 NOTE — Therapy (Addendum)
[facility] [place], [place], [address] Phone: [phone]   Fax:  [phone]  Physical Therapy Treatment  Patient Details  Name: Jesse Barton. MRN: [id number] Date of Birth: 1940-11-09 Referring Provider (PT): Clydell Hakim MD   Encounter Date: 05/02/2018  PT End of Session - 05/02/18 1338    Visit Number  3    Number of Visits  12    Date for PT Re-Evaluation  07/17/18    PT Start Time  4193    PT Stop Time  1341    PT Time Calculation (min)  42 min    Activity Tolerance  Patient tolerated treatment well;Patient limited by pain    Behavior During Therapy  [facility] for tasks assessed/performed       Past Medical History:  Diagnosis Date  . Cancer ([place])    Kidney  . Hypertension   . Kidney stone     Past Surgical History:  Procedure Laterality Date  . CATARACT EXTRACTION    . EYE SURGERY Bilateral    cataracts  . KIDNEY SURGERY    . NEPHRECTOMY Left     There were no vitals filed for this visit.  Subjective Assessment - 05/02/18 1303    Subjective  Patient reported no ES or [place] due to it hurting his back yet traction did good with a few hours of relief after     Pertinent History  Kidney removed, HTN.    Limitations  Standing;Walking    How long can you stand comfortably?  15 minutes.    How long can you walk comfortably?  Short community distances.    Diagnostic tests  X-ray and MRI.    Patient Stated Goals  Get out of pain and feel stronger.    Currently in Pain?  Yes    Pain Score  6     Pain Location  Back    Pain Orientation  Right    Pain Descriptors / Indicators  Aching    Pain Type  Chronic pain    Pain Onset  More than a month ago    Pain Frequency  Constant    Aggravating Factors   prolong activity    Pain Relieving Factors  at rest                       [facility] Adult PT Treatment/Exercise - 05/02/18 0001      Exercises   Exercises  Lumbar;Knee/Hip      Lumbar Exercises:  Supine   Ab Set  20 reps;5 seconds;4 seconds;1 second    Glut Set  20 reps;5 seconds;4 seconds;1 second    Clam  10 reps   with red-tband   Bent Knee Raise  3 seconds   2x20   Bridge with [employer]  10 reps;3 seconds    Bridge with clamshell  10 reps    Bridge with [employer] Limitations  red t-band      Knee/Hip Exercises: Aerobic   Nustep  L5 x7 min UE/LE activity, cues for core activation      Traction   Type of Traction  Lumbar    Min (lbs)  10    Max (lbs)  85    Hold Time  99    Rest Time  5    Time  15               PT Short Term Goals - 04/18/18 1428  PT SHORT TERM GOAL #1   Title  STG's=LTG's.        PT Long Term Goals - 05/02/18 1342      PT LONG TERM GOAL #1   Title  independent with a HEP.    Time  6    Period  Weeks    Status  On-going      PT LONG TERM GOAL #2   Title  Eliminate right LE symptoms.    Time  6    Period  Weeks    Status  On-going   ongoing 05/02/18     PT LONG TERM GOAL #3   Title  Patient walk a community distnace with pain not > 2-3/10.    Time  6    Period  Weeks    Status  On-going   6/10 05/02/18     PT LONG TERM GOAL #4   Title  Patient stand 20 minutes with pain not > 2-3/10.    Time  6    Period  Weeks    Status  On-going   6/10 05/02/18           Plan - 05/02/18 1343    Clinical Impression Statement  Patient tolerated treatment fair due to ongoing pain. Patient reported that [place] and ES seem to hurt his back yet the traction did very well with 2 hours of relief. Patient did good with core progression exercises today. Patient only able to tolerate light exercises today. Patient responded well to mechanical traction today. Goals ongoing at this time due to limitations.     Rehab Potential  Good    Clinical Impairments Affecting Rehab Potential  body weight 214#    PT Frequency  2x / week    PT Duration  6 weeks    PT Treatment/Interventions  ADLs/Self Care Home Management;Cryotherapy;Electrical  Stimulation;Traction;Moist Heat;Ultrasound;Therapeutic exercise;Patient/family education;Manual techniques    PT Next Visit Plan   cont with POC for nustep and core progression / progress traction up to 50% per tolerance    Consulted and Agree with Plan of Care  Patient       Patient will benefit from skilled therapeutic intervention in order to improve the following deficits and impairments:  Abnormal gait, Decreased activity tolerance, Pain, Postural dysfunction, Decreased range of motion  Visit Diagnosis: Chronic right-sided low back pain with right-sided sciatica     Problem List Patient Active Problem List   Diagnosis Date Noted  . Nephrolithiasis 05/16/2017  . Influenza 05/12/2017  . Acute recurrent frontal sinusitis 05/12/2017  . Impaired fasting glucose 04/08/2015  . HTN (hypertension) 03/11/2015  . Left ankle pain 03/11/2015  . HLD (hyperlipidemia) 03/11/2015    Raylyn Speckman P, PTA 05/02/2018, 1:59 PM  [facility] [place], [place], [address] Phone: [phone]   Fax:  [phone]  Name: Jesse Durnin. MRN: [id number] Date of Birth: 1940/07/02   PHYSICAL THERAPY DISCHARGE SUMMARY  Visits from Start of Care: 3.  Current functional level related to goals / functional outcomes: See above.   Remaining deficits: See below.   Education / Equipment: HEP. Plan: Patient agrees to discharge.  Patient goals were not met. Patient is being discharged due to not returning since the last visit.  ?????         Mali Applegate MPT

## 2018-08-21 IMAGING — MR MR LUMBAR SPINE W/O CM
4 of 5 series · 15 of 48 positions shown · non-contrast
Comparison: Lumbar spine radiographs May 22, 2017

CLINICAL DATA: Low back pain radiating to lower extremities after
fall May 2016. History of renal cell carcinoma.

EXAM:
MRI LUMBAR SPINE WITHOUT CONTRAST
TECHNIQUE: Multiplanar, multisequence MR imaging of the lumbar spine was
performed. No intravenous contrast was administered.

[Series 3: T2 · sagittal · 4.0mm · 0.69mm/px · 6 of 17 slices shown (1 of 2)]
[im 1/17]
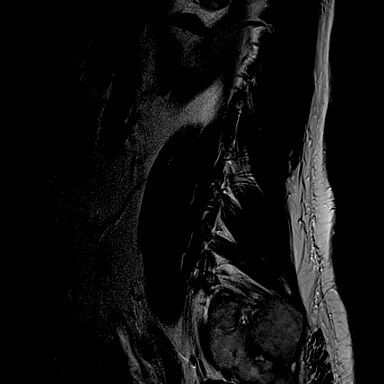
[im 3/17]
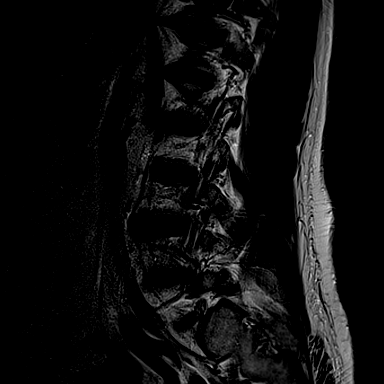
[im 6/17]
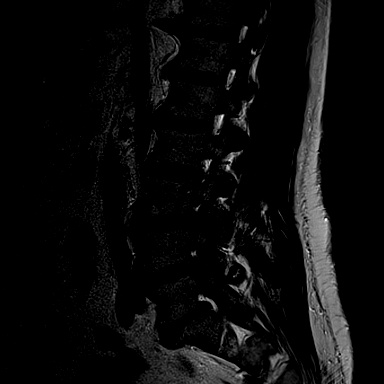
[im 9/17]
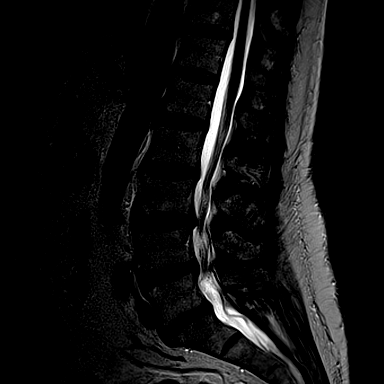
[im 11/17]
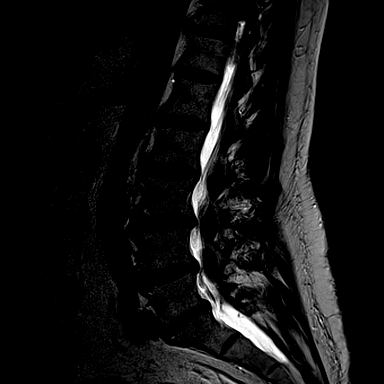
[im 14/17]
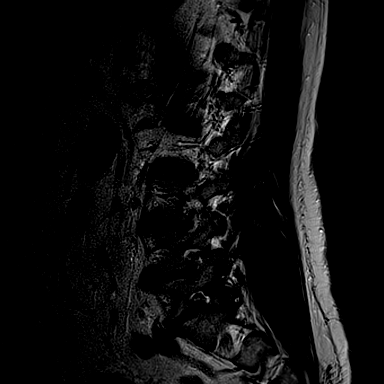

[Series 4: T1 · sagittal · 4.0mm · 0.35mm/px · 3 of 17 slices shown (1 of 2)]
[im 3/17]
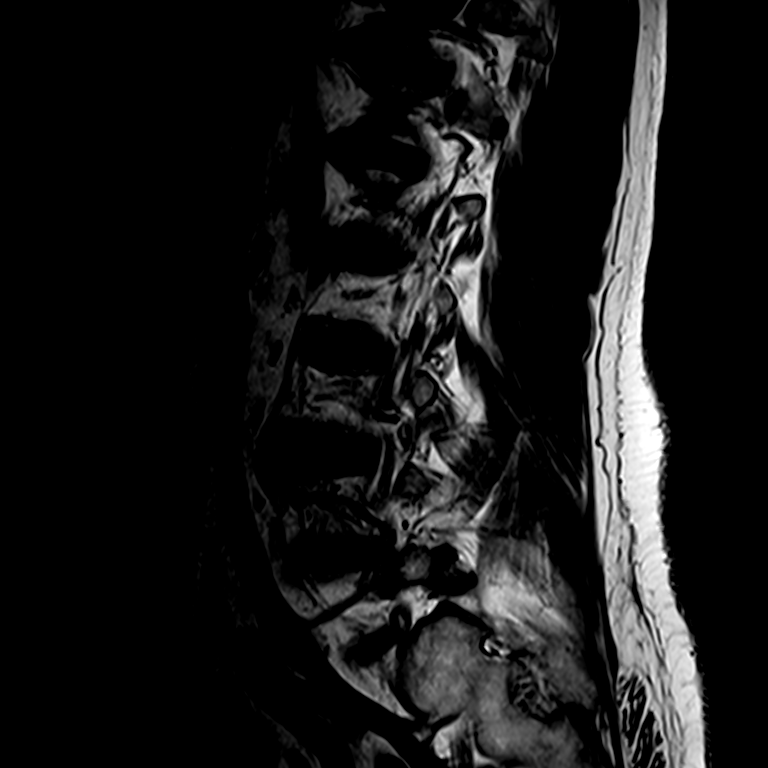
[im 9/17]
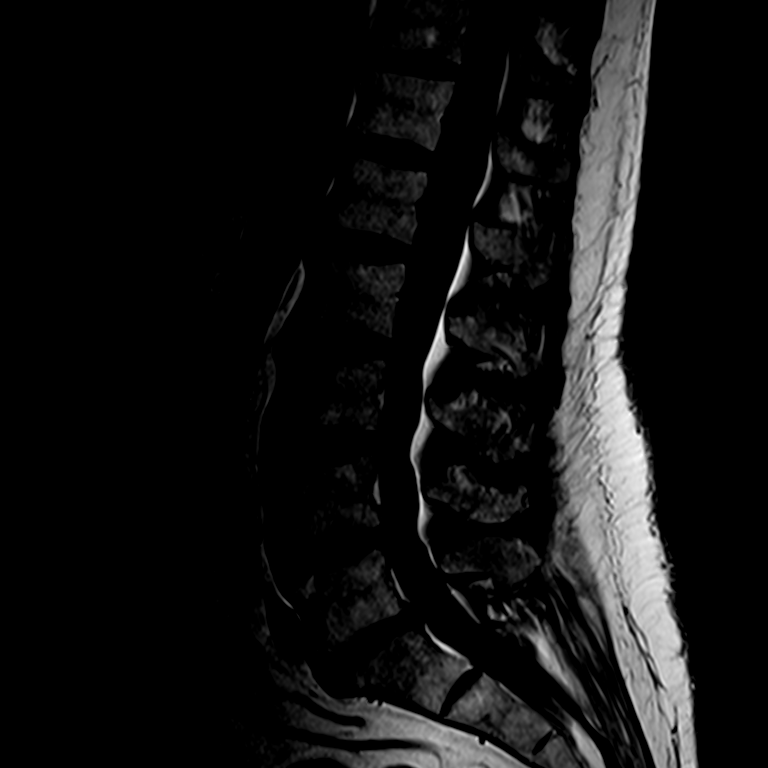
[im 14/17]
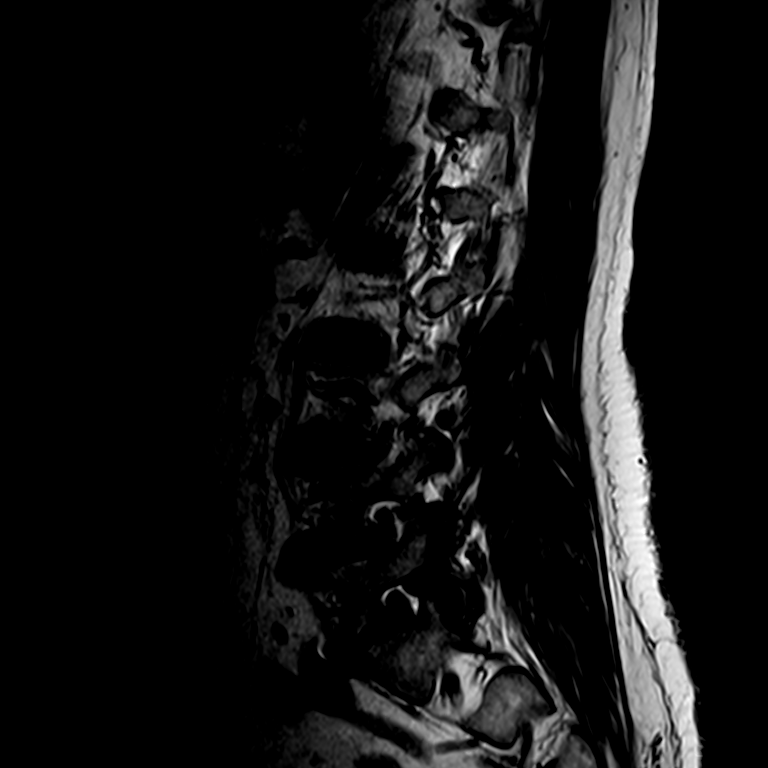

[Series 6: T2 · axial · 4.0mm · 0.26mm/px · z∈[-91,+51]mm · 3 of 38 slices shown (2 of 2)]
[im 6/38]
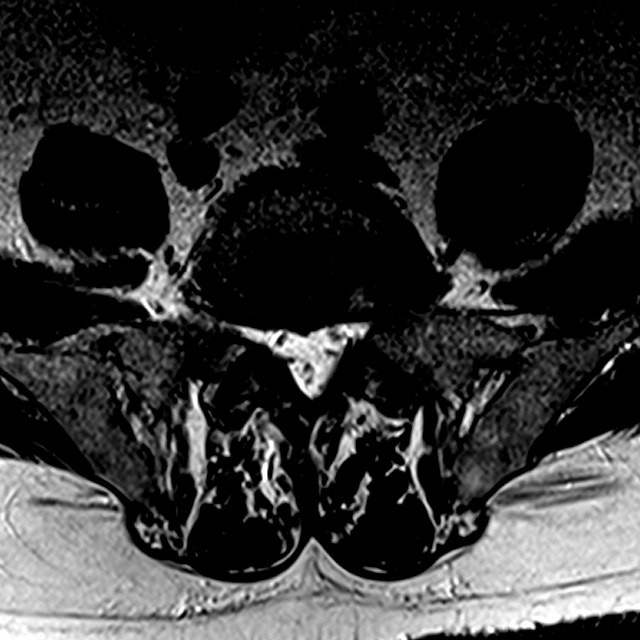
[im 20/38]
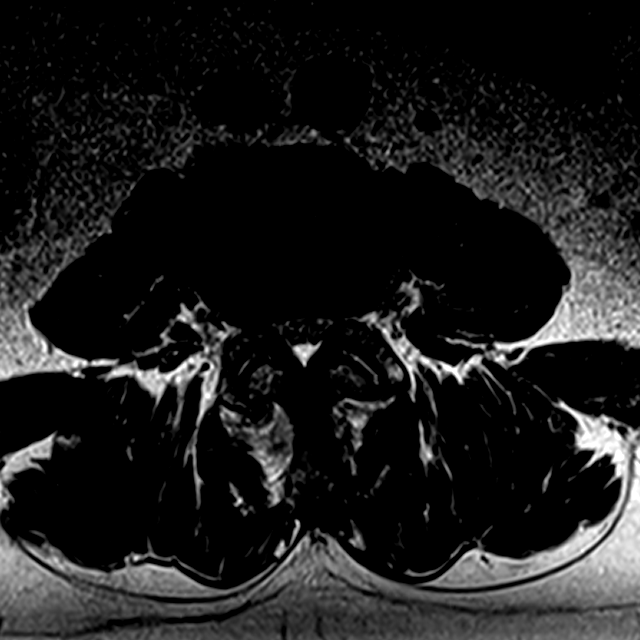
[im 32/38]
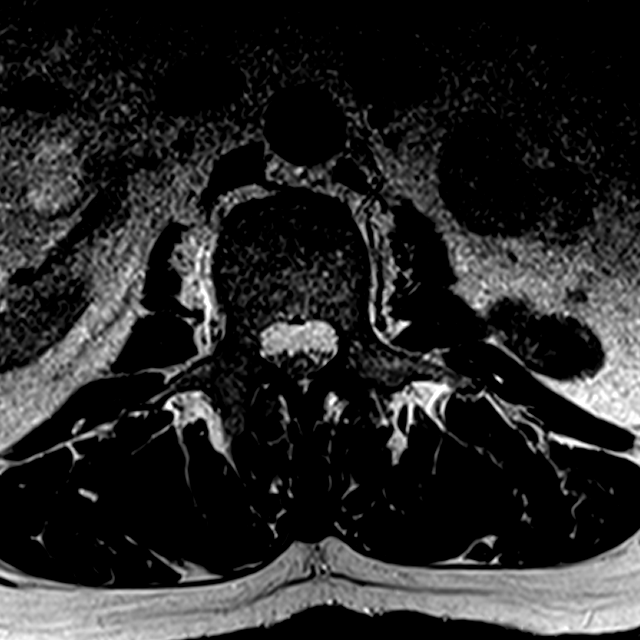

[Series 7: T1 · axial · 4.0mm · 0.24mm/px · z∈[-87,+50]mm · 3 of 38 slices shown (2 of 2)]
[im 6/38]
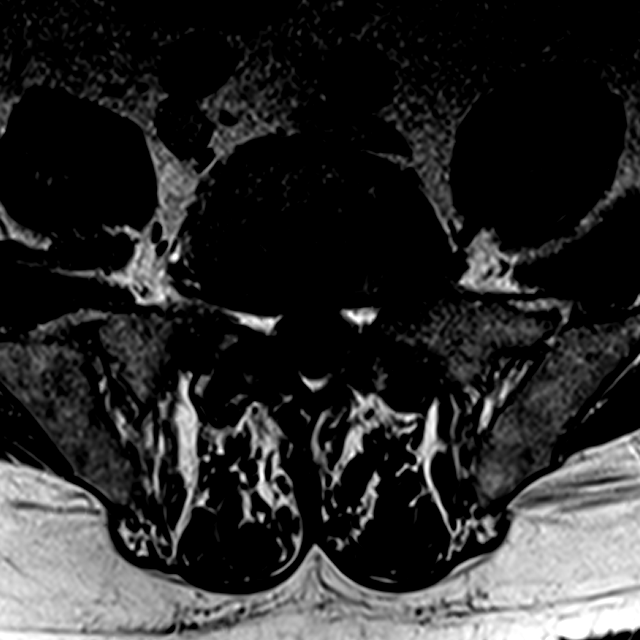
[im 20/38]
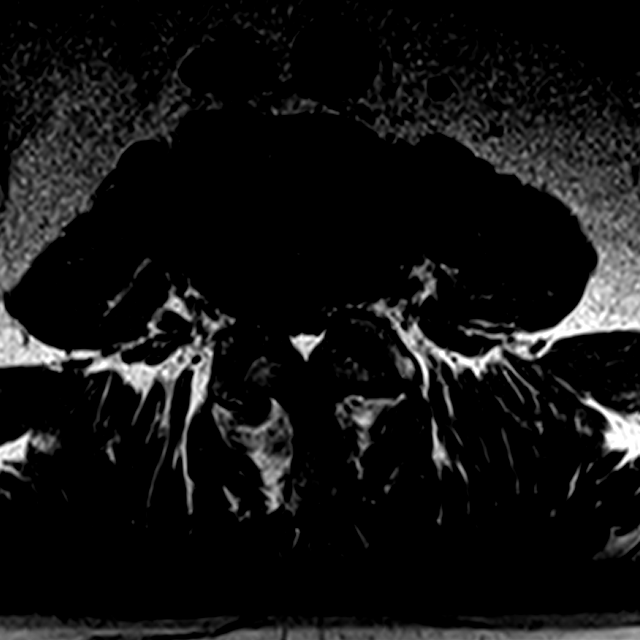
[im 32/38]
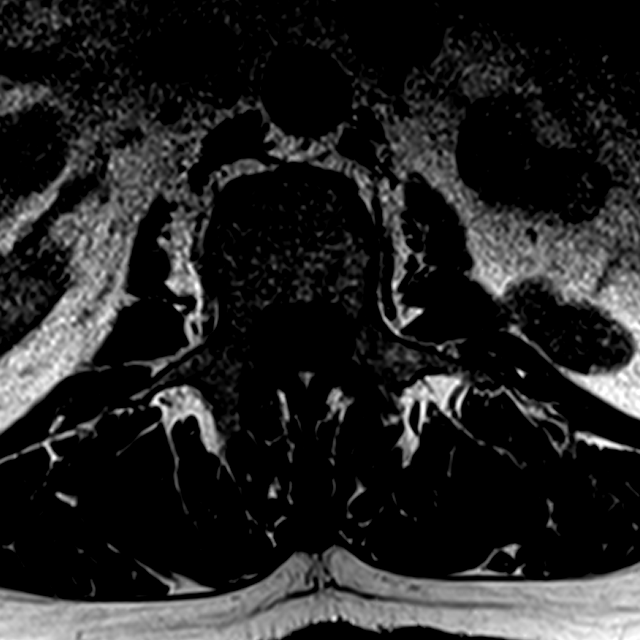

[15 of 48 positions shown; findings below may reference images not displayed]

FINDINGS: SEGMENTATION: For the purposes of this report, the last well-formed
intervertebral disc is reported as L5-S1.

ALIGNMENT: Maintained lumbar lordosis. No malalignment.

VERTEBRAE:Vertebral bodies are intact. Moderate L4-5 and moderate to
severe L5-S1 disc height loss. Diffuse disc desiccation with
multilevel mild and moderate subacute to chronic discogenic endplate
changes. Minimal acute discogenic endplate changes L4-5. No
suspicious bone marrow signal. Scattered old Schmorl's nodes.

CONUS MEDULLARIS AND CAUDA EQUINA: Conus medullaris terminates at L1
and demonstrates normal morphology and signal characteristics. Cauda
equina is normal.

PARASPINAL AND OTHER SOFT TISSUES: Nonacute. LEFT nephrectomy. RIGHT
extra renal pelvis and probable parapelvic cysts.

DISC LEVELS:

L1-2: No disc bulge, canal stenosis nor neural foraminal narrowing.

L2-3: Small broad-based disc bulge. Mild canal stenosis. No neural
foraminal narrowing.

L3-4: Moderate broad-based disc bulge with superimposed RIGHT
subarticular disc protrusion in total measuring 6 mm in AP
dimension. Moderate canal stenosis, narrowed RIGHT lateral recess
may affect the traversing RIGHT L4 nerve. Mild facet arthropathy.
Moderate RIGHT and mild LEFT neural foraminal narrowing.

L4-5: Small broad-based disc bulge. Moderate RIGHT subarticular disc
protrusion. Small suspected LEFT extraforaminal disc extrusion
inseparable from exited LEFT L4 nerves. Moderate facet arthropathy
and ligamentum flavum redundancy. Moderate canal stenosis. Mild
bilateral neural foraminal narrowing.

L5-S1: Small broad-based disc bulge. Small LEFT extraforaminal disc
protrusion may affect the exited LEFT L5 nerve. Moderate to severe
facet arthropathy. No canal stenosis. Mild LEFT neural foraminal
narrowing.
IMPRESSION: 1. Degenerative change of the lumbar spine without fracture or
malalignment.
2. Moderate canal stenosis L3-4 and L4-5. Narrowed L3-4 lateral
recess may affect the RIGHT L4 nerve.
3. Neural foraminal narrowing L3-4 through L5-S1: Moderate on the
RIGHT at L3-4.
4. L4-5 LEFT extraforaminal suspected disc extrusion may affect the
exited LEFT L4 nerve.

## 2018-10-21 ENCOUNTER — Encounter: Payer: Self-pay | Admitting: Family Medicine

## 2018-10-21 ENCOUNTER — Ambulatory Visit (INDEPENDENT_AMBULATORY_CARE_PROVIDER_SITE_OTHER): Payer: Medicare Other | Admitting: Family Medicine

## 2018-10-21 ENCOUNTER — Other Ambulatory Visit: Payer: Self-pay

## 2018-10-21 VITALS — BP 169/95 | HR 74 | Temp 96.9°F | Ht 69.0 in | Wt 221.0 lb

## 2018-10-21 DIAGNOSIS — G8929 Other chronic pain: Secondary | ICD-10-CM

## 2018-10-21 DIAGNOSIS — Z87442 Personal history of urinary calculi: Secondary | ICD-10-CM

## 2018-10-21 DIAGNOSIS — R3129 Other microscopic hematuria: Secondary | ICD-10-CM | POA: Diagnosis not present

## 2018-10-21 DIAGNOSIS — Z6832 Body mass index (BMI) 32.0-32.9, adult: Secondary | ICD-10-CM

## 2018-10-21 DIAGNOSIS — M5441 Lumbago with sciatica, right side: Secondary | ICD-10-CM

## 2018-10-21 DIAGNOSIS — Z905 Acquired absence of kidney: Secondary | ICD-10-CM

## 2018-10-21 DIAGNOSIS — E782 Mixed hyperlipidemia: Secondary | ICD-10-CM

## 2018-10-21 DIAGNOSIS — I10 Essential (primary) hypertension: Secondary | ICD-10-CM | POA: Diagnosis not present

## 2018-10-21 DIAGNOSIS — N200 Calculus of kidney: Secondary | ICD-10-CM

## 2018-10-21 DIAGNOSIS — R1031 Right lower quadrant pain: Secondary | ICD-10-CM | POA: Diagnosis not present

## 2018-10-21 HISTORY — DX: Personal history of urinary calculi: Z87.442

## 2018-10-21 LAB — URINALYSIS, COMPLETE
Bilirubin, UA: NEGATIVE
Glucose, UA: NEGATIVE
Ketones, UA: NEGATIVE
Leukocytes,UA: NEGATIVE
Nitrite, UA: NEGATIVE
Protein,UA: NEGATIVE
Specific Gravity, UA: 1.01 (ref 1.005–1.030)
Urobilinogen, Ur: 0.2 mg/dL (ref 0.2–1.0)
pH, UA: 6.5 (ref 5.0–7.5)

## 2018-10-21 LAB — MICROSCOPIC EXAMINATION
Bacteria, UA: NONE SEEN
RBC: >30 /hpf — AB (ref 0–2)
Renal Epithel, UA: NONE SEEN /hpf
WBC, UA: NONE SEEN /hpf (ref 0–5)

## 2018-10-21 MED ORDER — TAMSULOSIN HCL 0.4 MG PO CAPS: 0.4000 mg | ORAL_CAPSULE | Freq: Every day | ORAL | 3 refills | Status: DC

## 2018-10-21 MED ORDER — KETOROLAC TROMETHAMINE 60 MG/2ML IM SOLN
30.0000 mg | Freq: Once | INTRAMUSCULAR | Status: AC
  Administered 2018-10-21: 17:00:00 30 mg via INTRAMUSCULAR

## 2018-10-21 MED ORDER — AMLODIPINE BESYLATE 5 MG PO TABS
ORAL_TABLET | ORAL | 3 refills | Status: DC
Start: 2018-10-21 — End: 2019-01-28

## 2018-10-21 MED ORDER — METHYLPREDNISOLONE ACETATE 40 MG/ML IJ SUSP
40.0000 mg | Freq: Once | INTRAMUSCULAR | Status: AC
  Administered 2018-10-21: 40 mg via INTRAMUSCULAR

## 2018-10-21 NOTE — Progress Notes (Signed)
Subjective:  Patient ID: Jesse Mane., male    DOB: [birth year], 78 y.o.   MRN: [id number]  Patient Care Team: Baruch Gouty, FNP as PCP - General (Family Medicine)   Chief Complaint:  right side pain (right lower )   HPI: Jesse Riggi. is a 78 y.o. male presenting on 10/21/2018 for right side pain (right lower )  Pt presents today with complaints of right lower quadrant and flank pain and lower back pain that radiates into right leg. Pt states the lower abdominal and flank pain started 2 days ago and is getting worse. The problem occurs daily. The problem has been worsening. The quality of the pain is described as sharp, shooting, and stabbing. The pain is at a severity of 6/10. The pain is constant. Associated symptoms include hematuria. Pt denies fever, chills, weakness, diarrhea, nausea, vomiting or constipation. Pt has tried nothing for the symptoms. He has a history of nephrolithiasis. He has had a left nephrectomy in 2012 due to renal hilum tumor. Pt states he has been doing well since then. He has not followed up with Dr. Lacinda Axon in several years.  Pt states the pain in his back is ongoing. States the pain is worse with activity and certain movements. He has seen ortho int he past and had PT for the pain. Last PT session was in February 2020. Pt reports he has not followed up with his PCP in a long time. States he has been off of his medications for at least 6 months. He states his blood pressure si not usually high. States he does not check it at home. Pt states he does not watch his diet or exercise on a regular basis. No chest pain, shortness of breath, leg swelling, visual changes, or headaches.      Relevant past medical, surgical, family, and social history reviewed and updated as indicated.  Allergies and medications reviewed and updated. Date reviewed: Chart in Epic.   Past Medical History:  Diagnosis Date  . Cancer ([place])    Kidney  . Hypertension   . Kidney stone      Past Surgical History:  Procedure Laterality Date  . CATARACT EXTRACTION    . EYE SURGERY Bilateral    cataracts  . KIDNEY SURGERY    . NEPHRECTOMY Left     Social History   Socioeconomic History  . Marital status: Married    Spouse name: Not on file  . Number of children: 2  . Years of education: 10  . Highest education level: Some college, no degree  Occupational History  . Not on file  Social Needs  . Financial resource strain: Not hard at all  . Food insecurity    Worry: Never true    Inability: Never true  . Transportation needs    Medical: No    Non-medical: No  Tobacco Use  . Smoking status: Light Tobacco Smoker    Types: Cigars  . Smokeless tobacco: Never Used  Substance and Sexual Activity  . Alcohol use: Yes    Comment: occasional  . Drug use: No  . Sexual activity: Not on file  Lifestyle  . Physical activity    Days per week: 0 days    Minutes per session: 0 min  . Stress: Not at all  Relationships  . Social connections    Talks on phone: More than three times a week    Gets together: More than three times a  week    Attends religious service: 1 to 4 times per year    Active member of club or organization: No    Attends meetings of clubs or organizations: Never    Relationship status: Married  . Intimate partner violence    Fear of current or ex partner: Not on file    Emotionally abused: Not on file    Physically abused: Not on file    Forced sexual activity: Not on file  Other Topics Concern  . Not on file  Social History Narrative  . Not on file    Outpatient Encounter Medications as of 10/21/2018  Medication Sig  . amLODipine (NORVASC) 5 MG tablet TAKE ONE TABLET BY MOUTH DAILY. (CONTACT DR FOR APPOINTMENT)  . tamsulosin (FLOMAX) 0.4 MG CAPS capsule Take 1 capsule (0.4 mg total) by mouth daily.  . [DISCONTINUED] acetaminophen (TYLENOL) 500 MG tablet Take 1,000 mg by mouth every 6 (six) hours as needed.   . [DISCONTINUED] amLODipine  (NORVASC) 5 MG tablet TAKE ONE TABLET BY MOUTH DAILY. (CONTACT DR FOR APPOINTMENT)  . [DISCONTINUED] cephALEXin (KEFLEX) 500 MG capsule Take 1 capsule (500 mg total) by mouth 3 (three) times daily. (Patient not taking: Reported on 04/18/2018)  . [DISCONTINUED] valACYclovir (VALTREX) 1000 MG tablet Take 1 pill 3 times a day.  Start the medicine today. (Patient not taking: Reported on 04/18/2018)   Facility-Administered Encounter Medications as of 10/21/2018  Medication  . ketorolac (TORADOL) injection 30 mg  . methylPREDNISolone acetate (DEPO-MEDROL) injection 40 mg    Allergies  Allergen Reactions  . Ciprofloxacin Other (See Comments)    Increased lower extremity weakness  . Tamiflu [Oseltamivir]     Extreme weakness and joint instability     Review of Systems  Constitutional: Negative for activity change, appetite change, chills, diaphoresis, fatigue, fever and unexpected weight change.  Eyes: Negative for photophobia and visual disturbance.  Respiratory: Negative for cough, chest tightness and shortness of breath.   Cardiovascular: Negative for chest pain, palpitations and leg swelling.  Gastrointestinal: Positive for abdominal pain. Negative for anal bleeding, blood in stool, constipation, diarrhea, nausea and vomiting.  Genitourinary: Positive for flank pain and hematuria. Negative for decreased urine volume, difficulty urinating, discharge, dysuria, enuresis, frequency, genital sores, penile pain, penile swelling, scrotal swelling, testicular pain and urgency.  Musculoskeletal: Positive for arthralgias and back pain. Negative for gait problem, joint swelling, myalgias, neck pain and neck stiffness.  Skin: Negative for color change and pallor.  Neurological: Negative for dizziness, tremors, seizures, syncope, facial asymmetry, speech difficulty, weakness, light-headedness, numbness and headaches.  Hematological: Does not bruise/bleed easily.  Psychiatric/Behavioral: Negative for  confusion.  All other systems reviewed and are negative.       Objective:  BP (!) 173/99   Pulse 74   Temp (!) 96.9 F (36.1 C)   Ht '5\' 9"'$  (1.753 m)   Wt 221 lb (100.2 kg)   BMI 32.64 kg/m    Wt Readings from Last 3 Encounters:  10/21/18 221 lb (100.2 kg)  10/23/17 212 lb (96.2 kg)  10/22/17 212 lb (96.2 kg)    Physical Exam Vitals signs and nursing note reviewed.  Constitutional:      General: He is not in acute distress.    Appearance: Normal appearance. He is well-developed and well-groomed. He is obese. He is not ill-appearing, toxic-appearing or diaphoretic.  HENT:     Head: Normocephalic and atraumatic.     Jaw: There is normal jaw occlusion.  Right Ear: Hearing normal.     Left Ear: Hearing normal.     Nose: Nose normal.     Mouth/Throat:     Lips: Pink.     Mouth: Mucous membranes are moist.     Pharynx: Oropharynx is clear. Uvula midline.  Eyes:     General: Lids are normal.     Extraocular Movements: Extraocular movements intact.     Conjunctiva/sclera: Conjunctivae normal.     Pupils: Pupils are equal, round, and reactive to light.  Neck:     Musculoskeletal: Normal range of motion and neck supple.     Thyroid: No thyroid mass, thyromegaly or thyroid tenderness.     Vascular: No carotid bruit or JVD.     Trachea: Trachea and phonation normal.  Cardiovascular:     Rate and Rhythm: Normal rate and regular rhythm.     Chest Wall: PMI is not displaced.     Pulses: Normal pulses.     Heart sounds: Normal heart sounds. No murmur. No friction rub. No gallop.   Pulmonary:     Effort: Pulmonary effort is normal. No respiratory distress.     Breath sounds: Normal breath sounds. No wheezing.  Abdominal:     General: Abdomen is protuberant. Bowel sounds are normal. There is no distension or abdominal bruit.     Palpations: Abdomen is soft. There is no hepatomegaly or splenomegaly.     Tenderness: There is abdominal tenderness in the right lower quadrant.  There is no right CVA tenderness, left CVA tenderness, guarding or rebound. Negative signs include Murphy's sign and McBurney's sign.     Hernia: No hernia is present.    Musculoskeletal:     Right hip: Normal.     Left hip: Normal.     Thoracic back: Normal.     Lumbar back: He exhibits decreased range of motion, tenderness and pain. He exhibits no bony tenderness, no swelling, no edema, no deformity, no laceration, no spasm and normal pulse.       Back:     Right upper leg: Normal.     Left upper leg: Normal.     Right lower leg: No edema.     Left lower leg: No edema.     Comments: Positive right straight leg raise test  Lymphadenopathy:     Cervical: No cervical adenopathy.  Skin:    General: Skin is warm and dry.     Capillary Refill: Capillary refill takes less than 2 seconds.     Coloration: Skin is not cyanotic, jaundiced or pale.     Findings: No rash.  Neurological:     General: No focal deficit present.     Mental Status: He is alert and oriented to person, place, and time.     Cranial Nerves: Cranial nerves are intact.     Sensory: Sensation is intact.     Motor: Motor function is intact.     Coordination: Coordination is intact.     Gait: Gait abnormal (antalgic gait, walks with cane).     Deep Tendon Reflexes: Reflexes are normal and symmetric.  Psychiatric:        Attention and Perception: Attention and perception normal.        Mood and Affect: Mood and affect normal.        Speech: Speech normal.        Behavior: Behavior normal. Behavior is cooperative.        Thought Content: Thought content normal.  Cognition and Memory: Cognition and memory normal.        Judgment: Judgment normal.     Results for orders placed or performed during the hospital encounter of 10/23/17  CBC with Differential/Platelet  Result Value Ref Range   WBC 5.8 4.0 - 10.5 K/uL   RBC 4.41 4.22 - 5.81 MIL/uL   Hemoglobin 14.5 13.0 - 17.0 g/dL   HCT 42.2 39.0 - 52.0 %   MCV  95.7 78.0 - 100.0 fL   MCH 32.9 26.0 - 34.0 pg   MCHC 34.4 30.0 - 36.0 g/dL   RDW 12.9 11.5 - 15.5 %   Platelets 183 150 - 400 K/uL   Neutrophils Relative % 50 %   Neutro Abs 3.0 1.7 - 7.7 K/uL   Lymphocytes Relative 35 %   Lymphs Abs 2.1 0.7 - 4.0 K/uL   Monocytes Relative 12 %   Monocytes Absolute 0.7 0.1 - 1.0 K/uL   Eosinophils Relative 2 %   Eosinophils Absolute 0.1 0.0 - 0.7 K/uL   Basophils Relative 1 %   Basophils Absolute 0.1 0.0 - 0.1 K/uL  Comprehensive metabolic panel  Result Value Ref Range   Sodium 142 135 - 145 mmol/L   Potassium 3.8 3.5 - 5.1 mmol/L   Chloride 107 98 - 111 mmol/L   CO2 28 22 - 32 mmol/L   Glucose, Bld 113 (H) 70 - 99 mg/dL   BUN 16 8 - 23 mg/dL   Creatinine, Ser 1.02 0.61 - 1.24 mg/dL   Calcium 9.2 8.9 - 10.3 mg/dL   Total Protein 7.0 6.5 - 8.1 g/dL   Albumin 4.2 3.5 - 5.0 g/dL   AST 18 15 - 41 U/L   ALT 14 0 - 44 U/L   Alkaline Phosphatase 50 38 - 126 U/L   Total Bilirubin 0.9 0.3 - 1.2 mg/dL   GFR calc non Af Amer >60 >60 mL/min   GFR calc Af Amer >60 >60 mL/min   Anion gap 7 5 - 15     urinalysis in office with 3+ blood, otherwise unremarkable.   Pertinent labs & imaging results that were available during my care of the patient were reviewed by me and considered in my medical decision making.  Assessment & Plan:  Jesse Barton was seen today for right side pain.  Diagnoses and all orders for this visit:  Essential hypertension BP poorly controlled. Will restart amlodipine today. Daily blood pressure log given with instructions on how to fill out and told to bring to next visit. Gaol BP 130/80. Pt aware to report any persistent high or low readings. DASH diet and exercise encouraged. Exercise at least 150 minutes per week and increase as tolerated. Goal BMI > 25. Stress management encouraged. Smoking cessation discussed. Avoid excessive alcohol. Avoid NSAID's. Avoid more than 2000 mg of sodium daily. Medications as prescribed. Follow up as  scheduled.  -     CMP14+EGFR -     CBC with Differential/Platelet -     Thyroid Panel With TSH -     amLODipine (NORVASC) 5 MG tablet; TAKE ONE TABLET BY MOUTH DAILY. (CONTACT DR FOR APPOINTMENT)  Right lower quadrant abdominal pain Other microscopic hematuria History of nephrolithiasis Concerning for current or recently passed renal stone. Hematuria noted in office. Will refer to urology as pt has not been in a while. Flomax prescribed. Increase water intake over the next few days. Soft mass in right lower abdomen concerning for hernia. Due to signs and symptoms, will order  imaging. Report any new or worsening symptoms. Pt aware of symptoms that require emergent evaluation and treatment.  -     Urinalysis, Complete -     CT Abdomen Pelvis W Contrast; Future -     CMP14+EGFR -     CBC with Differential/Platelet -     tamsulosin (FLOMAX) 0.4 MG CAPS capsule; Take 1 capsule (0.4 mg total) by mouth daily.  Chronic midline low back pain with right-sided sciatica Chronic pain. Will give steroid and toradol injection in office. Follow up with othro for on going pain.  -     methylPREDNISolone acetate (DEPO-MEDROL) injection 40 mg -     ketorolac (TORADOL) injection 30 mg  Mixed hyperlipidemia Diet and exercise encouraged. Labs pending.  -     CMP14+EGFR -     Lipid panel  BMI 32.0-32.9,adult Diet and exercise encouraged. Labs pending.  -     CMP14+EGFR -     Thyroid Panel With TSH -     Lipid panel   Continue all other maintenance medications.  Follow up plan: Return if symptoms worsen or fail to improve, for HTN, hematuria .  Continue healthy lifestyle choices, including diet (rich in fruits, vegetables, and lean proteins, and low in salt and simple carbohydrates) and exercise (at least 30 minutes of moderate physical activity daily).  Educational handout given for kidney stones  The above assessment and management plan was discussed with the patient. The patient verbalized  understanding of and has agreed to the management plan. Patient is aware to call the clinic if symptoms persist or worsen. Patient is aware when to return to the clinic for a follow-up visit. Patient educated on when it is appropriate to go to the emergency department.   Monia Pouch, FNP-C [facility] [phone] 10/21/18

## 2018-10-21 NOTE — Patient Instructions (Signed)
Kidney Stones  Kidney stones (urolithiasis) are rock-like masses that form inside of the kidneys. Kidneys are organs that make pee (urine). A kidney stone can cause very bad pain and can block the flow of pee. The stone usually leaves your body (passes) through your pee. You may need to have a doctor take out the stone. Follow these instructions at home: Eating and drinking  Drink enough fluid to keep your pee clear or pale yellow. This will help you pass the stone.  If told by your doctor, change the foods you eat (your diet). This may include: ? Limiting how much salt (sodium) you eat. ? Eating more fruits and vegetables. ? Limiting how much meat, poultry, fish, and eggs you eat.  Follow instructions from your doctor about eating or drinking restrictions. General instructions  Collect pee samples as told by your doctor. You may need to collect a pee sample: ? 24 hours after a stone comes out. ? 8-12 weeks after a stone comes out, and every 6-12 months after that.  Strain your pee every time you pee (urinate), for as long as told. Use the strainer that your doctor recommends.  Do not throw out the stone. Keep it so that it can be tested by your doctor.  Take over-the-counter and prescription medicines only as told by your doctor.  Keep all follow-up visits as told by your doctor. This is important. You may need follow-up tests. Preventing kidney stones To prevent another kidney stone:  Drink enough fluid to keep your pee clear or pale yellow. This is the best way to prevent kidney stones.  Eat healthy foods.  Avoid certain foods as told by your doctor. You may be told to eat less protein.  Stay at a healthy weight. Contact a doctor if:  You have pain that gets worse or does not get better with medicine. Get help right away if:  You have a fever or chills.  You get very bad pain.  You get new pain in your belly (abdomen).  You pass out (faint).  You cannot pee. This  information is not intended to replace advice given to you by your health care provider. Make sure you discuss any questions you have with your health care provider. Document Released: 08/23/2007 Document Revised: 10/16/2017 Document Reviewed: 11/23/2015 Elsevier Patient Education  [place]. Hematuria, Adult Hematuria is blood in the urine. Blood may be visible in the urine, or it may be identified with a test. This condition can be caused by infections of the bladder, urethra, kidney, or prostate. Other possible causes include:  Kidney stones.  Cancer of the urinary tract.  Too much calcium in the urine.  Conditions that are passed from parent to child (inherited conditions).  Exercise that requires a lot of energy. Infections can usually be treated with medicine, and a kidney stone usually will pass through your urine. If neither of these is the cause of your hematuria, more tests may be needed to identify the cause of your symptoms. It is very important to tell your health care provider about any blood in your urine, even if it is painless or the blood stops without treatment. Blood in the urine, when it happens and then stops and then happens again, can be a symptom of a very serious condition, including cancer. There is no pain in the initial stages of many urinary cancers. Follow these instructions at home: Medicines  Take over-the-counter and prescription medicines only as told by your health  care provider.  If you were prescribed an antibiotic medicine, take it as told by your health care provider. Do not stop taking the antibiotic even if you start to feel better. Eating and drinking  Drink enough fluid to keep your urine clear or pale yellow. It is recommended that you drink 3-4 quarts (2.8-3.8 L) a day. If you have been diagnosed with an infection, it is recommended that you drink cranberry juice in addition to large amounts of water.  Avoid caffeine, tea, and  carbonated beverages. These tend to irritate the bladder.  Avoid alcohol because it may irritate the prostate (men). General instructions  If you have been diagnosed with a kidney stone, follow your health care provider's instructions about straining your urine to catch the stone.  Empty your bladder often. Avoid holding urine for long periods of time.  If you are male: ? After a bowel movement, wipe from front to back and use each piece of toilet paper only once. ? Empty your bladder before and after sex.  Pay attention to any changes in your symptoms. Tell your health care provider about any changes or any new symptoms.  It is your responsibility to get your test results. Ask your health care provider, or the department performing the test, when your results will be ready.  Keep all follow-up visits as told by your health care provider. This is important. Contact a health care provider if:  You develop back pain.  You have a fever.  You have nausea or vomiting.  Your symptoms do not improve after 3 days.  Your symptoms get worse. Get help right away if:  You develop severe vomiting and are unable take medicine without vomiting.  You develop severe pain in your back or abdomen even though you are taking medicine.  You pass a large amount of blood in your urine.  You pass blood clots in your urine.  You feel very weak or like you might faint.  You faint. Summary  Hematuria is blood in the urine. It has many possible causes.  It is very important that you tell your health care provider about any blood in your urine, even if it is painless or the blood stops without treatment.  Take over-the-counter and prescription medicines only as told by your health care provider.  Drink enough fluid to keep your urine clear or pale yellow. This information is not intended to replace advice given to you by your health care provider. Make sure you discuss any questions you have  with your health care provider. Document Released: 03/06/2005 Document Revised: 02/16/2017 Document Reviewed: 04/08/2016 Elsevier Patient Education  2020 [employer].

## 2018-10-22 ENCOUNTER — Telehealth: Payer: Self-pay | Admitting: Family Medicine

## 2018-10-22 LAB — CMP14+EGFR
ALT: 13 IU/L (ref 0–44)
AST: 21 IU/L (ref 0–40)
Albumin/Globulin Ratio: 2.1 (ref 1.2–2.2)
Albumin: 4.9 g/dL — ABNORMAL HIGH (ref 3.7–4.7)
Alkaline Phosphatase: 63 IU/L (ref 39–117)
BUN/Creatinine Ratio: 14 (ref 10–24)
BUN: 13 mg/dL (ref 8–27)
Bilirubin Total: 0.3 mg/dL (ref 0.0–1.2)
CO2: 22 mmol/L (ref 20–29)
Calcium: 9.6 mg/dL (ref 8.6–10.2)
Chloride: 99 mmol/L (ref 96–106)
Creatinine, Ser: 0.93 mg/dL (ref 0.76–1.27)
GFR calc Af Amer: 93 mL/min/{1.73_m2} (ref >59)
GFR calc non Af Amer: 80 mL/min/{1.73_m2} (ref >59)
Globulin, Total: 2.3 g/dL (ref 1.5–4.5)
Glucose: 105 mg/dL — ABNORMAL HIGH (ref 65–99)
Potassium: 4 mmol/L (ref 3.5–5.2)
Sodium: 139 mmol/L (ref 134–144)
Total Protein: 7.2 g/dL (ref 6.0–8.5)

## 2018-10-22 LAB — CBC WITH DIFFERENTIAL/PLATELET
Basophils Absolute: 0 10*3/uL (ref 0.0–0.2)
Basos: 1 %
EOS (ABSOLUTE): 0.1 10*3/uL (ref 0.0–0.4)
Eos: 2 %
Hematocrit: 46.7 % (ref 37.5–51.0)
Hemoglobin: 15.5 g/dL (ref 13.0–17.7)
Immature Grans (Abs): 0 10*3/uL (ref 0.0–0.1)
Immature Granulocytes: 0 %
Lymphocytes Absolute: 2.6 10*3/uL (ref 0.7–3.1)
Lymphs: 37 %
MCH: 32 pg (ref 26.6–33.0)
MCHC: 33.2 g/dL (ref 31.5–35.7)
MCV: 96 fL (ref 79–97)
Monocytes Absolute: 0.7 10*3/uL (ref 0.1–0.9)
Monocytes: 10 %
Neutrophils Absolute: 3.6 10*3/uL (ref 1.4–7.0)
Neutrophils: 50 %
Platelets: 264 10*3/uL (ref 150–450)
RBC: 4.85 x10E6/uL (ref 4.14–5.80)
RDW: 13.5 % (ref 11.6–15.4)
WBC: 7.1 10*3/uL (ref 3.4–10.8)

## 2018-10-22 LAB — LIPID PANEL
Chol/HDL Ratio: 5.4 ratio — ABNORMAL HIGH (ref 0.0–5.0)
Cholesterol, Total: 225 mg/dL — ABNORMAL HIGH (ref 100–199)
HDL: 42 mg/dL (ref >39)
Triglycerides: 551 mg/dL (ref 0–149)

## 2018-10-22 LAB — THYROID PANEL WITH TSH
Free Thyroxine Index: 1.8 (ref 1.2–4.9)
T3 Uptake Ratio: 26 % (ref 24–39)
T4, Total: 7 ug/dL (ref 4.5–12.0)
TSH: 1.7 u[IU]/mL (ref 0.450–4.500)

## 2018-10-22 MED ORDER — ASPIRIN 81 MG PO TBEC: 81.0000 mg | DELAYED_RELEASE_TABLET | Freq: Every day | ORAL | 12 refills | Status: DC

## 2018-10-22 MED ORDER — ICOSAPENT ETHYL 1 G PO CAPS: 2.0000 g | ORAL_CAPSULE | Freq: Two times a day (BID) | ORAL | 1 refills | Status: DC

## 2018-10-22 MED ORDER — ATORVASTATIN CALCIUM 40 MG PO TABS: 40.0000 mg | ORAL_TABLET | Freq: Every day | ORAL | 1 refills | Status: DC

## 2018-10-23 ENCOUNTER — Other Ambulatory Visit: Payer: Self-pay | Admitting: *Deleted

## 2018-10-23 DIAGNOSIS — E782 Mixed hyperlipidemia: Secondary | ICD-10-CM

## 2018-10-23 MED ORDER — LIPITOR 40 MG PO TABS: 40.0000 mg | ORAL_TABLET | Freq: Every day | ORAL | 5 refills | Status: DC

## 2018-10-23 NOTE — Telephone Encounter (Signed)
Pt aware.

## 2018-10-24 ENCOUNTER — Telehealth: Payer: Self-pay | Admitting: Family Medicine

## 2018-10-24 NOTE — Telephone Encounter (Signed)
Can change to brand Lipitor. Work note can be given. Can give restrictions to not lift more than 10 pounds.

## 2018-10-25 NOTE — Telephone Encounter (Signed)
Pt notified of RX and work note Note to front for pt pick up

## 2018-10-28 ENCOUNTER — Telehealth: Payer: Self-pay | Admitting: Family Medicine

## 2018-10-28 NOTE — Telephone Encounter (Signed)
Patient aware.

## 2018-10-28 NOTE — Telephone Encounter (Signed)
I have sent the form in. The radiology department will give the oral gastrografin.

## 2018-10-29 ENCOUNTER — Telehealth: Payer: Self-pay | Admitting: *Deleted

## 2018-10-29 NOTE — Telephone Encounter (Addendum)
Prior Auth for Brand Name Lipitor 40mg -APPROVED through 03/20/2019.    KeyIona Beard -    PA Case ID: [id number]   [facility] is reviewing your PA request. Typically an electronic response will be received within 72 hours. To check for an update later, open this request from your dashboard.  You may close this dialog and return to your dashboard to perform other tasks.

## 2018-11-05 ENCOUNTER — Ambulatory Visit (HOSPITAL_COMMUNITY)
Admission: RE | Admit: 2018-11-05 | Discharge: 2018-11-05 | Disposition: A | Discharge status: Home / Self Care | Payer: Medicare Other | Source: Ambulatory Visit | Attending: Family Medicine | Admitting: Family Medicine

## 2018-11-05 ENCOUNTER — Other Ambulatory Visit: Payer: Self-pay

## 2018-11-05 DIAGNOSIS — N132 Hydronephrosis with renal and ureteral calculous obstruction: Secondary | ICD-10-CM | POA: Diagnosis not present

## 2018-11-05 DIAGNOSIS — R1031 Right lower quadrant pain: Secondary | ICD-10-CM | POA: Diagnosis not present

## 2018-11-05 MED ORDER — IOHEXOL 300 MG/ML  SOLN
100.0000 mL | Freq: Once | INTRAMUSCULAR | Status: AC | PRN
  Administered 2018-11-05: 100 mL via INTRAVENOUS

## 2018-11-05 MED ORDER — IOHEXOL 300 MG/ML  SOLN: 100.0000 mL | Freq: Once | INTRAMUSCULAR | Status: DC | PRN

## 2018-11-06 ENCOUNTER — Emergency Department (HOSPITAL_COMMUNITY)
Admission: EM | Admit: 2018-11-06 | Discharge: 2018-11-06 | Disposition: A | Discharge status: Home / Self Care | Payer: Medicare Other | Attending: Emergency Medicine | Admitting: Emergency Medicine

## 2018-11-06 ENCOUNTER — Other Ambulatory Visit: Payer: Self-pay

## 2018-11-06 ENCOUNTER — Encounter (HOSPITAL_COMMUNITY): Payer: Self-pay | Admitting: Emergency Medicine

## 2018-11-06 DIAGNOSIS — F17290 Nicotine dependence, other tobacco product, uncomplicated: Secondary | ICD-10-CM | POA: Insufficient documentation

## 2018-11-06 DIAGNOSIS — N200 Calculus of kidney: Secondary | ICD-10-CM

## 2018-11-06 DIAGNOSIS — I10 Essential (primary) hypertension: Secondary | ICD-10-CM | POA: Diagnosis not present

## 2018-11-06 DIAGNOSIS — R109 Unspecified abdominal pain: Secondary | ICD-10-CM

## 2018-11-06 DIAGNOSIS — R1031 Right lower quadrant pain: Secondary | ICD-10-CM | POA: Diagnosis present

## 2018-11-06 DIAGNOSIS — N201 Calculus of ureter: Secondary | ICD-10-CM | POA: Insufficient documentation

## 2018-11-06 LAB — CBC WITH DIFFERENTIAL/PLATELET
Abs Immature Granulocytes: 0.01 10*3/uL (ref 0.00–0.07)
Basophils Absolute: 0 10*3/uL (ref 0.0–0.1)
Basophils Relative: 1 %
Eosinophils Absolute: 0.1 10*3/uL (ref 0.0–0.5)
Eosinophils Relative: 1 %
HCT: 43.9 % (ref 39.0–52.0)
Hemoglobin: 14.5 g/dL (ref 13.0–17.0)
Immature Granulocytes: 0 %
Lymphocytes Relative: 25 %
Lymphs Abs: 1.8 10*3/uL (ref 0.7–4.0)
MCH: 31.9 pg (ref 26.0–34.0)
MCHC: 33 g/dL (ref 30.0–36.0)
MCV: 96.5 fL (ref 80.0–100.0)
Monocytes Absolute: 0.6 10*3/uL (ref 0.1–1.0)
Monocytes Relative: 8 %
Neutro Abs: 4.8 10*3/uL (ref 1.7–7.7)
Neutrophils Relative %: 65 %
Platelets: 234 10*3/uL (ref 150–400)
RBC: 4.55 MIL/uL (ref 4.22–5.81)
RDW: 13.2 % (ref 11.5–15.5)
WBC: 7.3 10*3/uL (ref 4.0–10.5)
nRBC: 0 % (ref 0.0–0.2)

## 2018-11-06 LAB — COMPREHENSIVE METABOLIC PANEL
ALT: 17 U/L (ref 0–44)
AST: 22 U/L (ref 15–41)
Albumin: 4.6 g/dL (ref 3.5–5.0)
Alkaline Phosphatase: 50 U/L (ref 38–126)
Anion gap: 12 (ref 5–15)
BUN: 15 mg/dL (ref 8–23)
CO2: 25 mmol/L (ref 22–32)
Calcium: 9.4 mg/dL (ref 8.9–10.3)
Chloride: 102 mmol/L (ref 98–111)
Creatinine, Ser: 0.87 mg/dL (ref 0.61–1.24)
GFR calc Af Amer: >60 mL/min (ref >60)
GFR calc non Af Amer: >60 mL/min (ref >60)
Glucose, Bld: 117 mg/dL — ABNORMAL HIGH (ref 70–99)
Potassium: 4.2 mmol/L (ref 3.5–5.1)
Sodium: 139 mmol/L (ref 135–145)
Total Bilirubin: 0.8 mg/dL (ref 0.3–1.2)
Total Protein: 7.2 g/dL (ref 6.5–8.1)

## 2018-11-06 LAB — URINALYSIS, ROUTINE W REFLEX MICROSCOPIC
Bacteria, UA: NONE SEEN
Bilirubin Urine: NEGATIVE
Glucose, UA: NEGATIVE mg/dL
Ketones, ur: NEGATIVE mg/dL
Leukocytes,Ua: NEGATIVE
Nitrite: NEGATIVE
Protein, ur: NEGATIVE mg/dL
RBC / HPF: >50 RBC/hpf — ABNORMAL HIGH (ref 0–5)
Specific Gravity, Urine: 1.009 (ref 1.005–1.030)
pH: 6 (ref 5.0–8.0)

## 2018-11-06 LAB — PROTIME-INR
INR: 1 (ref 0.8–1.2)
Prothrombin Time: 13.3 seconds (ref 11.4–15.2)

## 2018-11-06 NOTE — ED Provider Notes (Signed)
Emergency Department Provider Note   I have reviewed the triage vital signs and the nursing notes.   HISTORY  Chief Complaint Flank Pain   HPI Jesse Alms. is a 78 y.o. male with PMH of HTN, multiple kidney stones, and s/p left nephrectomy presents to the emergency department for evaluation of right flank pain.  Patient has had multiple kidney stones in the past.  He states that there was known to be a large stone on the right which has failed lithotripsy twice in the past.  Many years ago he followed with a urologist in [place] but does not currently follow with anyone.  He began having right flank pain 2 to 3 days ago with some intermittent blood in urine.  The pain is intermittently severe and radiates to the right lower quadrant.  Patient had an outpatient abdominal CT scan performed through his PCPs office and results came back today showing a 1.5 cm stone with mild hydronephrosis in the proximal ureter on the right.  His PCP referred him to the emergency department immediately.  He has not had fevers or chills.   Past Medical History:  Diagnosis Date  . Cancer ([place])    Kidney  . Hypertension   . Kidney stone     Patient Active Problem List   Diagnosis Date Noted  . Chronic midline low back pain with right-sided sciatica 10/21/2018  . Single kidney 10/21/2018  . History of nephrolithiasis 10/21/2018  . Nephrolithiasis 05/16/2017  . Influenza 05/12/2017  . Acute recurrent frontal sinusitis 05/12/2017  . Impaired fasting glucose 04/08/2015  . HTN (hypertension) 03/11/2015  . Left ankle pain 03/11/2015  . HLD (hyperlipidemia) 03/11/2015    Past Surgical History:  Procedure Laterality Date  . CATARACT EXTRACTION    . EYE SURGERY Bilateral    cataracts  . KIDNEY SURGERY    . NEPHRECTOMY Left     Allergies Ciprofloxacin and Tamiflu [oseltamivir]  Family History  Problem Relation Age of Onset  . Cancer Mother   . Leukemia Father   . Schizophrenia Sister    . Cancer Sister 61       kidney    Social History Social History   Tobacco Use  . Smoking status: Light Tobacco Smoker    Types: Cigars  . Smokeless tobacco: Never Used  Substance Use Topics  . Alcohol use: Yes    Comment: occasional  . Drug use: No    Review of Systems  Constitutional: No fever/chills Eyes: No visual changes. ENT: No sore throat. Cardiovascular: Denies chest pain. Respiratory: Denies shortness of breath.  Gastrointestinal: No abdominal pain. Positive right flank pain. No nausea, no vomiting.  No diarrhea.  No constipation. Genitourinary: Negative for dysuria. Positive intermittent hematuria.  Musculoskeletal: Chronic back pain. Skin: Negative for rash. Neurological: Negative for headaches, focal weakness or numbness.  10-point ROS otherwise negative.  ____________________________________________   PHYSICAL EXAM:  VITAL SIGNS: ED Triage Vitals  Enc Vitals Group     BP 11/06/18 1327 (!) 181/96     Pulse Rate 11/06/18 1327 83     Resp 11/06/18 1327 16     Temp 11/06/18 1327 97.6 F (36.4 C)     Temp Source 11/06/18 1327 Oral     SpO2 11/06/18 1327 97 %   Constitutional: Alert and oriented. Well appearing and in no acute distress. Eyes: Conjunctivae are normal. Head: Atraumatic. Nose: No congestion/rhinnorhea. Mouth/Throat: Mucous membranes are moist. Neck: No stridor.  Cardiovascular: Normal rate, regular rhythm.  Good peripheral circulation. Grossly normal heart sounds.   Respiratory: Normal respiratory effort.  No retractions. Lungs CTAB. Gastrointestinal: Soft with mild RLQ tenderness. No distention.  Musculoskeletal: No lower extremity tenderness nor edema. No gross deformities of extremities. Neurologic:  Normal speech and language.  Skin:  Skin is warm, dry and intact. No rash noted.  ____________________________________________   LABS (all labs ordered are listed, but only abnormal results are displayed)  Labs Reviewed   URINALYSIS, ROUTINE W REFLEX MICROSCOPIC - Abnormal; Notable for the following components:      Result Value   Hgb urine dipstick LARGE (*)    RBC / HPF >50 (*)    All other components within normal limits  COMPREHENSIVE METABOLIC PANEL - Abnormal; Notable for the following components:   Glucose, Bld 117 (*)    All other components within normal limits  URINE CULTURE  CBC WITH DIFFERENTIAL/PLATELET  PROTIME-INR   ____________________________________________  RADIOLOGY  CT scan reviewed from yesterday.  ____________________________________________   PROCEDURES  Procedure(s) performed:   Procedures  None  ____________________________________________   INITIAL IMPRESSION / ASSESSMENT AND PLAN / ED COURSE  Pertinent labs & imaging results that were available during my care of the patient were reviewed by me and considered in my medical decision making (see chart for details).   Patient presents to the emergency department for evaluation of right flank pain.  CT scan from yesterday shows 1.5 cm stone in the proximal ureter with mild hydro-.  Patient having symptoms currently without fever.  He is status post nephrectomy on the left.  He has not noticed any decrease in urine output.  I will repeat his chemistries and follow UA.  Will need to discuss with urology given the size of the stone and only one kidney.   Spoke with Dr. Alyson Ingles with Urology regarding the CT findings. Pain is well controlled here. No evidence of infection. He will see the patient at the [place] office at 8 AM tomorrow morning. Advised that the patient stay NPO after midnight tonight and return with any new or worsening symptoms. Labs with normal renal function and patient is urinating.  ____________________________________________  FINAL CLINICAL IMPRESSION(S) / ED DIAGNOSES  Final diagnoses:  Right flank pain  Kidney stone    Note:  This document was prepared using Dragon voice recognition software and  may include unintentional dictation errors.  Nanda Quinton, MD Emergency Medicine    Long, Wonda Old[facility], MD 11/06/18 [phone]

## 2018-11-06 NOTE — Discharge Instructions (Addendum)
You were seen in the emergency department today with flank pain.  You have a large kidney stone on the right side.  Since this is your only kidney we need to be very careful in this situation.  I have contacted the urologist Dr. Alyson Ingles.  He would like to see you first thing tomorrow morning in his office in [place].  He has put you on the schedule for 8 AM.  Please present to this office for evaluation and treatment.  If you develop fever, worsening pain, or other severe symptoms before that time you should return to the emergency department.

## 2018-11-06 NOTE — ED Triage Notes (Signed)
Patient c/o sharp pain in his R side that started the first of the month. Has history of kidney stones. Had CT yesterday. Called by PCP and sent to ED.

## 2018-11-07 ENCOUNTER — Other Ambulatory Visit: Payer: Self-pay

## 2018-11-07 DIAGNOSIS — Z905 Acquired absence of kidney: Secondary | ICD-10-CM | POA: Diagnosis not present

## 2018-11-07 DIAGNOSIS — N200 Calculus of kidney: Secondary | ICD-10-CM | POA: Diagnosis not present

## 2018-11-08 ENCOUNTER — Encounter: Payer: Self-pay | Admitting: Family Medicine

## 2018-11-08 ENCOUNTER — Ambulatory Visit (INDEPENDENT_AMBULATORY_CARE_PROVIDER_SITE_OTHER): Payer: Medicare Other | Admitting: Family Medicine

## 2018-11-08 VITALS — BP 140/72 | HR 91 | Temp 98.6°F | Ht 69.0 in | Wt 214.0 lb

## 2018-11-08 DIAGNOSIS — N200 Calculus of kidney: Secondary | ICD-10-CM

## 2018-11-08 DIAGNOSIS — E782 Mixed hyperlipidemia: Secondary | ICD-10-CM | POA: Diagnosis not present

## 2018-11-08 DIAGNOSIS — I10 Essential (primary) hypertension: Secondary | ICD-10-CM

## 2018-11-08 LAB — URINE CULTURE
Culture: NO GROWTH
Special Requests: NORMAL

## 2018-11-08 MED ORDER — KETOROLAC TROMETHAMINE 30 MG/ML IJ SOLN
30.0000 mg | Freq: Once | INTRAMUSCULAR | Status: AC
  Administered 2018-11-08: 17:00:00 30 mg via INTRAMUSCULAR

## 2018-11-08 NOTE — Patient Instructions (Signed)

## 2018-11-08 NOTE — Progress Notes (Signed)
Subjective:  Patient ID: Jesse Barton., male    DOB: [birth year], 78 y.o.   MRN: [id number]  Patient Care Team: Baruch Gouty, FNP as PCP - General (Family Medicine)   Chief Complaint:  Hypertension and Flank Pain (stone follow up)   HPI: Jesse Barton. is a 78 y.o. male presenting on 11/08/2018 for Hypertension and Flank Pain (stone follow up)   1. Essential hypertension  Complaint with meds - Yes since reinitiating at last visit.  Current Medications - Norvasc 5 mg Checking BP at home - No Exercising Regularly - No Watching Salt intake - No Pertinent ROS:  Headache - No Fatigue - No Visual Disturbances - No Chest pain - No Dyspnea - No Palpitations - No LE edema - No They report good compliance with medications and can restate their regimen by memory. No medication side effects.  Family, social, and smoking history reviewed.   BP Readings from Last 3 Encounters:  11/08/18 140/72  11/06/18 (!) 175/101  10/21/18 (!) 169/95   CMP Latest Ref Rng & Units 11/06/2018 10/21/2018 10/23/2017  Glucose 70 - 99 mg/dL 117(H) 105(H) 113(H)  BUN 8 - 23 mg/dL 15 13 16   Creatinine 0.61 - 1.24 mg/dL 0.87 0.93 1.02  Sodium 135 - 145 mmol/L 139 139 142  Potassium 3.5 - 5.1 mmol/L 4.2 4.0 3.8  Chloride 98 - 111 mmol/L 102 99 107  CO2 22 - 32 mmol/L 25 22 28   Calcium 8.9 - 10.3 mg/dL 9.4 9.6 9.2  Total Protein 6.5 - 8.1 g/dL 7.2 7.2 7.0  Total Bilirubin 0.3 - 1.2 mg/dL 0.8 0.3 0.9  Alkaline Phos 38 - 126 U/L 50 63 50  AST 15 - 41 U/L 22 21 18   ALT 0 - 44 U/L 17 13 14     Pt feels blood pressure issue is caused from pain due to nephrolithiasis. Pt aware pain can slightly elevate blood pressure but not as high as his has been and for as long as his has been elevated.    2. Mixed hyperlipidemia  Compliant with medications - Yes Current medications - Lipitor Side effects from medications - Yes, has intermittent myalgias Diet - Eats a lot of cheese, eggs, red meats, and bread   Exercise - none  Lab Results  Component Value Date   CHOL 225 (H) 10/21/2018   HDL 42 10/21/2018   [place] Comment 10/21/2018   TRIG 551 (HH) 10/21/2018   CHOLHDL 5.4 (H) 10/21/2018     Family and personal medical history reviewed. Smoking and ETOH history reviewed.    3. Nephrolithiasis  CT-scan of the abdomen revealed a 1.5 cm calculus at the right ureteropelvic junction with mild right hydronephrosis. There are multiple additional small nonobstructive calculi in the right kidney. Pt continued to have pain so he was sent to the ED for evaluation and treatment due to single kidney. Pt was evaluated at discharged. Pt was to follow up with urology the following day. Pt went to urology office and had an issue when he was checking in so he left. Pt has yet to follow up with urology. Pt continues to have right flank and groin pain. Pt states he has intermittent hematuria. No fever, chills, anuria, or confusion. No weakness or fatigue. He has been taking Flomax as prescribed. Has not passed a stone to his knowledge.      Relevant past medical, surgical, family, and social history reviewed and updated as indicated.  Allergies and medications  reviewed and updated. Date reviewed: Chart in Epic.   Past Medical History:  Diagnosis Date  . Cancer ([place])    Kidney  . Hypertension   . Kidney stone     Past Surgical History:  Procedure Laterality Date  . CATARACT EXTRACTION    . EYE SURGERY Bilateral    cataracts  . KIDNEY SURGERY    . NEPHRECTOMY Left     Social History   Socioeconomic History  . Marital status: Married    Spouse name: Not on file  . Number of children: 2  . Years of education: 22  . Highest education level: Some college, no degree  Occupational History  . Not on file  Social Needs  . Financial resource strain: Not hard at all  . Food insecurity    Worry: Never true    Inability: Never true  . Transportation needs    Medical: No    Non-medical: No   Tobacco Use  . Smoking status: Light Tobacco Smoker    Types: Cigars  . Smokeless tobacco: Never Used  Substance and Sexual Activity  . Alcohol use: Yes    Comment: occasional  . Drug use: No  . Sexual activity: Not on file  Lifestyle  . Physical activity    Days per week: 0 days    Minutes per session: 0 min  . Stress: Not at all  Relationships  . Social connections    Talks on phone: More than three times a week    Gets together: More than three times a week    Attends religious service: 1 to 4 times per year    Active member of club or organization: No    Attends meetings of clubs or organizations: Never    Relationship status: Married  . Intimate partner violence    Fear of current or ex partner: Not on file    Emotionally abused: Not on file    Physically abused: Not on file    Forced sexual activity: Not on file  Other Topics Concern  . Not on file  Social History Narrative  . Not on file    Outpatient Encounter Medications as of 11/08/2018  Medication Sig  . amLODipine (NORVASC) 5 MG tablet TAKE ONE TABLET BY MOUTH DAILY. (CONTACT DR FOR APPOINTMENT)  . aspirin (ASPIRIN ADULT LOW DOSE) 81 MG EC tablet Take 1 tablet (81 mg total) by mouth daily. Swallow whole.  [place] HYDROcodone-acetaminophen (NORCO/VICODIN) 5-325 MG tablet Take 1 tablet by mouth every 4 (four) hours as needed. for pain  . Icosapent Ethyl 1 g CAPS Take 2 capsules (2 g total) by mouth 2 (two) times daily with a meal.  . LIPITOR 40 MG tablet Take 1 tablet (40 mg total) by mouth daily.  . tamsulosin (FLOMAX) 0.4 MG CAPS capsule Take 1 capsule (0.4 mg total) by mouth daily.  . [EXPIRED] ketorolac (TORADOL) 30 MG/ML injection 30 mg    No facility-administered encounter medications on file as of 11/08/2018.     Allergies  Allergen Reactions  . Ciprofloxacin Other (See Comments)    Increased lower extremity weakness  . Tamiflu [Oseltamivir]     Extreme weakness and joint instability     Review of  Systems  Constitutional: Negative for activity change, appetite change, chills, diaphoresis, fatigue, fever and unexpected weight change.  HENT: Negative.   Eyes: Negative.  Negative for photophobia and visual disturbance.  Respiratory: Negative for cough, chest tightness and shortness of breath.   Cardiovascular: Negative  for chest pain, palpitations and leg swelling.  Gastrointestinal: Positive for abdominal pain. Negative for abdominal distention, anal bleeding, blood in stool, constipation, diarrhea, nausea, rectal pain and vomiting.  Endocrine: Negative.  Negative for polydipsia, polyphagia and polyuria.  Genitourinary: Positive for flank pain and hematuria. Negative for decreased urine volume, difficulty urinating, dysuria, frequency, penile pain, penile swelling, scrotal swelling, testicular pain and urgency.  Musculoskeletal: Positive for back pain. Negative for arthralgias and myalgias.  Skin: Negative.  Negative for color change and pallor.  Allergic/Immunologic: Negative.   Neurological: Negative for dizziness, tremors, seizures, syncope, facial asymmetry, speech difficulty, weakness, light-headedness, numbness and headaches.  Hematological: Negative.  Does not bruise/bleed easily.  Psychiatric/Behavioral: Negative for agitation, behavioral problems, confusion, decreased concentration, dysphoric mood, hallucinations, self-injury, sleep disturbance and suicidal ideas. The patient is not nervous/anxious and is not hyperactive.   All other systems reviewed and are negative.       Objective:  BP 140/72   Pulse 91   Temp 98.6 F (37 C) (Oral)   Ht 5\' 9"  (1.753 m)   Wt 214 lb (97.1 kg)   BMI 31.60 kg/m    Wt Readings from Last 3 Encounters:  11/08/18 214 lb (97.1 kg)  10/21/18 221 lb (100.2 kg)  10/23/17 212 lb (96.2 kg)    Physical Exam Vitals signs and nursing note reviewed.  Constitutional:      General: He is not in acute distress.    Appearance: Normal appearance. He  is well-developed and well-groomed. He is obese. He is not ill-appearing, toxic-appearing or diaphoretic.  HENT:     Head: Normocephalic and atraumatic.     Jaw: There is normal jaw occlusion.     Right Ear: Hearing normal.     Left Ear: Hearing normal.     Nose: Nose normal.     Mouth/Throat:     Lips: Pink.     Mouth: Mucous membranes are moist.     Pharynx: Oropharynx is clear. Uvula midline.  Eyes:     General: Lids are normal.     Extraocular Movements: Extraocular movements intact.     Conjunctiva/sclera: Conjunctivae normal.     Pupils: Pupils are equal, round, and reactive to light.  Neck:     Musculoskeletal: Normal range of motion and neck supple.     Thyroid: No thyroid mass, thyromegaly or thyroid tenderness.     Vascular: No carotid bruit or JVD.     Trachea: Trachea and phonation normal.  Cardiovascular:     Rate and Rhythm: Normal rate and regular rhythm.     Chest Wall: PMI is not displaced.     Pulses: Normal pulses.     Heart sounds: Normal heart sounds. No murmur. No friction rub. No gallop.   Pulmonary:     Effort: Pulmonary effort is normal. No respiratory distress.     Breath sounds: Normal breath sounds. No wheezing.  Abdominal:     General: Abdomen is protuberant. Bowel sounds are normal. There is no distension or abdominal bruit.     Palpations: Abdomen is soft. There is no hepatomegaly or splenomegaly.     Tenderness: There is abdominal tenderness in the right lower quadrant. There is no right CVA tenderness, left CVA tenderness, guarding or rebound. Negative signs include Murphy's sign and McBurney's sign.     Hernia: No hernia is present.  Musculoskeletal:     Thoracic back: Normal.     Lumbar back: He exhibits decreased range of motion, tenderness (chronic in  nature) and pain. He exhibits no bony tenderness, no swelling, no edema, no deformity, no laceration, no spasm and normal pulse.     Right lower leg: No edema.     Left lower leg: No edema.   Lymphadenopathy:     Cervical: No cervical adenopathy.  Skin:    General: Skin is warm and dry.     Capillary Refill: Capillary refill takes less than 2 seconds.     Coloration: Skin is not cyanotic, jaundiced or pale.     Findings: No rash.  Neurological:     General: No focal deficit present.     Mental Status: He is alert and oriented to person, place, and time.     Cranial Nerves: Cranial nerves are intact.     Sensory: Sensation is intact.     Motor: Motor function is intact.     Coordination: Coordination is intact.     Gait: Gait abnormal (antalgic gain, uses cane).     Deep Tendon Reflexes: Reflexes are normal and symmetric.  Psychiatric:        Attention and Perception: Attention and perception normal.        Mood and Affect: Mood and affect normal.        Speech: Speech normal.        Behavior: Behavior normal. Behavior is cooperative.        Thought Content: Thought content normal.        Cognition and Memory: Cognition and memory normal.        Judgment: Judgment normal.     Results for orders placed or performed during the hospital encounter of 11/06/18  Urine culture   Specimen: Urine, Clean Catch  Result Value Ref Range   Specimen Description      URINE, CLEAN CATCH Performed at [facility], [address], [place], [place] [address]    Special Requests      Normal Performed at [facility], [address], [place], [place] [address]    Culture      NO GROWTH Performed at [facility], [place] [address] [place], [place] [address]    Report Status 11/08/2018 FINAL   Urinalysis, Routine w reflex microscopic- may I&O cath if menses  Result Value Ref Range   Color, Urine YELLOW YELLOW   APPearance CLEAR CLEAR   Specific Gravity, Urine 1.009 1.005 - 1.030   pH 6.0 5.0 - 8.0   Glucose, UA NEGATIVE NEGATIVE mg/dL   Hgb urine dipstick LARGE (A) NEGATIVE   Bilirubin Urine NEGATIVE NEGATIVE   Ketones, ur NEGATIVE NEGATIVE mg/dL   Protein, ur NEGATIVE  NEGATIVE mg/dL   Nitrite NEGATIVE NEGATIVE   Leukocytes,Ua NEGATIVE NEGATIVE   RBC / HPF >50 (H) 0 - 5 RBC/hpf   WBC, UA 6-10 0 - 5 WBC/hpf   Bacteria, UA NONE SEEN NONE SEEN   Squamous Epithelial / LPF 0-5 0 - 5  Comprehensive metabolic panel  Result Value Ref Range   Sodium 139 135 - 145 mmol/L   Potassium 4.2 3.5 - 5.1 mmol/L   Chloride 102 98 - 111 mmol/L   CO2 25 22 - 32 mmol/L   Glucose, Bld 117 (H) 70 - 99 mg/dL   BUN 15 8 - 23 mg/dL   Creatinine, Ser 0.87 0.61 - 1.24 mg/dL   Calcium 9.4 8.9 - 10.3 mg/dL   Total Protein 7.2 6.5 - 8.1 g/dL   Albumin 4.6 3.5 - 5.0 g/dL   AST 22 15 -  41 U/L   ALT 17 0 - 44 U/L   Alkaline Phosphatase 50 38 - 126 U/L   Total Bilirubin 0.8 0.3 - 1.2 mg/dL   GFR calc non Af Amer >60 >60 mL/min   GFR calc Af Amer >60 >60 mL/min   Anion gap 12 5 - 15  CBC with Differential  Result Value Ref Range   WBC 7.3 4.0 - 10.5 K/uL   RBC 4.55 4.22 - 5.81 MIL/uL   Hemoglobin 14.5 13.0 - 17.0 g/dL   HCT 43.9 39.0 - 52.0 %   MCV 96.5 80.0 - 100.0 fL   MCH 31.9 26.0 - 34.0 pg   MCHC 33.0 30.0 - 36.0 g/dL   RDW 13.2 11.5 - 15.5 %   Platelets 234 150 - 400 K/uL   nRBC 0.0 0.0 - 0.2 %   Neutrophils Relative % 65 %   Neutro Abs 4.8 1.7 - 7.7 K/uL   Lymphocytes Relative 25 %   Lymphs Abs 1.8 0.7 - 4.0 K/uL   Monocytes Relative 8 %   Monocytes Absolute 0.6 0.1 - 1.0 K/uL   Eosinophils Relative 1 %   Eosinophils Absolute 0.1 0.0 - 0.5 K/uL   Basophils Relative 1 %   Basophils Absolute 0.0 0.0 - 0.1 K/uL   Immature Granulocytes 0 %   Abs Immature Granulocytes 0.01 0.00 - 0.07 K/uL  Protime-INR  Result Value Ref Range   Prothrombin Time 13.3 11.4 - 15.2 seconds   INR 1.0 0.8 - 1.2       Pertinent labs & imaging results that were available during my care of the patient were reviewed by me and considered in my medical decision making.  Assessment & Plan:  Yance was seen today for hypertension and flank pain.  Diagnoses and all orders for this  visit:  Essential hypertension BP has improved greatly with medication therapy. Pt had labs in ED that revealed normal kidney function after restarting HTN medications. Continue current medications. Report any persistent high or low readings. Follow up in 4 weeks for reevaluation.    Mixed hyperlipidemia Diet and exercise discussed in detail. Pt aware of changes that need to be made to diet. Continue statin therapy. Can try taking every other day to see if this helps with myalgias. Report any worsening side effects.   Nephrolithiasis Appointment made for Monday at [facility] at [facility] in [place]. Pt aware of symptoms that require emergent evaluation or treatment. Pt agrees to keep appointment Monday. Due to continued pain, will give IM ketorolac in office. Pt to continue Flomax and increase water intake.  -     ketorolac (TORADOL) 30 MG/ML injection 30 mg     Continue all other maintenance medications.  Follow up plan: Return in about 4 weeks (around 12/06/2018), or if symptoms worsen or fail to improve, for BP.  Continue healthy lifestyle choices, including diet (rich in fruits, vegetables, and lean proteins, and low in salt and simple carbohydrates) and exercise (at least 30 minutes of moderate physical activity daily).  Educational handout given for kidney stones  The above assessment and management plan was discussed with the patient. The patient verbalized understanding of and has agreed to the management plan. Patient is aware to call the clinic if symptoms persist or worsen. Patient is aware when to return to the clinic for a follow-up visit. Patient educated on when it is appropriate to go to the emergency department.   Monia Pouch, FNP-C [facility] [phone] 11/08/18

## 2018-11-11 DIAGNOSIS — N200 Calculus of kidney: Secondary | ICD-10-CM | POA: Diagnosis not present

## 2018-11-11 DIAGNOSIS — Z905 Acquired absence of kidney: Secondary | ICD-10-CM | POA: Diagnosis not present

## 2018-11-14 ENCOUNTER — Telehealth: Payer: Self-pay | Admitting: Family Medicine

## 2018-11-14 ENCOUNTER — Other Ambulatory Visit: Payer: Self-pay | Admitting: Urology

## 2018-11-14 NOTE — Progress Notes (Signed)
11-06-2018 in epic cbcdiff, cmp, pt-inr, urinalysis, urine  culture

## 2018-11-14 NOTE — Telephone Encounter (Signed)
Can this be changed to something else? Patient of Sharyn Lull

## 2018-11-14 NOTE — Telephone Encounter (Signed)
Jesse Barton to address when she returns

## 2018-11-14 NOTE — Patient Instructions (Signed)
DUE TO COVID-19 ONLY ONE VISITOR IS ALLOWED TO COME WITH YOU AND STAY IN THE WAITING ROOM ONLY DURING PRE OP AND PROCEDURE DAY OF SURGERY. THE 1 VISITOR MAY VISIT WITH YOU AFTER SURGERY IN YOUR PRIVATE ROOM DURING VISITING HOURS ONLY!   YOU NEED TO HAVE A COVID 19 TEST ON__Friday, August 28_____ @_______ , THIS TEST MUST BE DONE BEFORE SURGERY, COME  [place], [place] [employer] , [address].  ([place]) ONCE YOUR COVID TEST IS COMPLETED, PLEASE BEGIN THE QUARANTINE INSTRUCTIONS AS OUTLINED IN YOUR HANDOUT.                [place]   Your procedure is scheduled on: 11-19-2018   Report to [facility]  Entrance   Report to admitting at 12:00PM     Call this number if you have problems the morning of surgery [place], [place].    Remember: Do not eat food:After Midnight. YOU MAY HAVE CLEAR LIQUIDS FROM MIDNIGHT UNTIL 8:00AM. NOTHING BY MOUTH AFTER 8:00AM!   CLEAR LIQUID DIET   Foods Allowed                                                                     Foods Excluded  Coffee and tea, regular and decaf                             liquids that you cannot  Plain Jell-O any favor except red or purple                                           see through such as: Fruit ices (not with fruit pulp)                                     milk, soups, orange juice  Iced Popsicles                                    All solid food Carbonated beverages, regular and diet                                    Cranberry, grape and apple juices Sports drinks like Gatorade Lightly seasoned clear broth or consume(fat free) Sugar, honey syrup  Sample Menu Breakfast                                Lunch                                     Supper Cranberry juice  Beef broth                            Chicken broth Jell-O                                     Grape  juice                           Apple juice Coffee or tea                        Jell-O                                      Popsicle                                                Coffee or tea                        Coffee or tea  _____________________________________________________________________    Take these medicines the morning of surgery with A SIP OF WATER: AMLODIPINE , TAMSULOSIN                                 You may not have any metal on your body including hair pins and              piercings  Do not wear jewelry, make-up, lotions, powders or perfumes, deodorant                        Men may shave face and neck.   Do not bring valuables to the hospital. [employer].  Contacts, dentures or bridgework may not be worn into surgery.      Patients discharged the day of surgery will not be allowed to drive home. IF YOU ARE HAVING SURGERY AND GOING HOME THE SAME DAY, YOU MUST HAVE AN ADULT TO DRIVE YOU HOME AND BE WITH YOU FOR 24 HOURS. YOU MAY GO HOME BY TAXI OR UBER OR ORTHERWISE, BUT AN ADULT MUST ACCOMPANY YOU HOME AND STAY WITH YOU FOR 24 HOURS.  Name and phone number of your driver:  Special Instructions: N/A              Please read over the following fact sheets you were given: _____________________________________________________________________             [facility] - Preparing for Surgery Before surgery, you can play an important role.  Because skin is not sterile, your skin needs to be as free of germs as possible.  You can reduce the number of germs on your skin by washing with CHG (chlorahexidine gluconate) soap before surgery.  CHG is an antiseptic cleaner which kills germs and bonds with the skin to continue killing germs even after washing. Please DO NOT use if you have an allergy to CHG  or antibacterial soaps.  If your skin becomes reddened/irritated stop using the CHG and inform your nurse when you arrive  at Short Stay. Do not shave (including legs and underarms) for at least 48 hours prior to the first CHG shower.  You may shave your face/neck. Please follow these instructions carefully:  1.  Shower with CHG Soap the night before surgery and the  morning of Surgery.  2.  If you choose to wash your hair, wash your hair first as usual with your  normal  shampoo.  3.  After you shampoo, rinse your hair and body thoroughly to remove the  shampoo.                           4.  Use CHG as you would any other liquid soap.  You can apply chg directly  to the skin and wash                       Gently with a scrungie or clean washcloth.  5.  Apply the CHG Soap to your body ONLY FROM THE NECK DOWN.   Do not use on face/ open                           Wound or open sores. Avoid contact with eyes, ears mouth and genitals (private parts).                       Wash face,  Genitals (private parts) with your normal soap.             6.  Wash thoroughly, paying special attention to the area where your surgery  will be performed.  7.  Thoroughly rinse your body with warm water from the neck down.  8.  DO NOT shower/wash with your normal soap after using and rinsing off  the CHG Soap.                9.  Pat yourself dry with a clean towel.            10.  Wear clean pajamas.            11.  Place clean sheets on your bed the night of your first shower and do not  sleep with pets. Day of Surgery : Do not apply any lotions/deodorants the morning of surgery.  Please wear clean clothes to the hospital/surgery center.  FAILURE TO FOLLOW THESE INSTRUCTIONS MAY RESULT IN THE CANCELLATION OF YOUR SURGERY PATIENT SIGNATURE_________________________________  NURSE SIGNATURE__________________________________  ________________________________________________________________________

## 2018-11-15 ENCOUNTER — Other Ambulatory Visit (HOSPITAL_COMMUNITY)
Admission: RE | Admit: 2018-11-15 | Discharge: 2018-11-15 | Disposition: A | Discharge status: Home / Self Care | Payer: Medicare Other | Source: Ambulatory Visit | Attending: Urology | Admitting: Urology

## 2018-11-15 ENCOUNTER — Encounter (HOSPITAL_COMMUNITY)
Admission: RE | Admit: 2018-11-15 | Discharge: 2018-11-15 | Disposition: A | Discharge status: Home / Self Care | Payer: Medicare Other | Source: Ambulatory Visit | Attending: Urology | Admitting: Urology

## 2018-11-15 ENCOUNTER — Encounter (HOSPITAL_COMMUNITY): Payer: Self-pay

## 2018-11-15 ENCOUNTER — Other Ambulatory Visit: Payer: Self-pay

## 2018-11-15 DIAGNOSIS — Z01818 Encounter for other preprocedural examination: Secondary | ICD-10-CM | POA: Diagnosis not present

## 2018-11-15 DIAGNOSIS — Z20828 Contact with and (suspected) exposure to other viral communicable diseases: Secondary | ICD-10-CM | POA: Diagnosis not present

## 2018-11-15 HISTORY — DX: Sciatica, unspecified side: M54.30

## 2018-11-15 LAB — BASIC METABOLIC PANEL
Anion gap: 9 (ref 5–15)
BUN: 16 mg/dL (ref 8–23)
CO2: 27 mmol/L (ref 22–32)
Calcium: 9.9 mg/dL (ref 8.9–10.3)
Chloride: 105 mmol/L (ref 98–111)
Creatinine, Ser: 0.97 mg/dL (ref 0.61–1.24)
GFR calc Af Amer: >60 mL/min (ref >60)
GFR calc non Af Amer: >60 mL/min (ref >60)
Glucose, Bld: 119 mg/dL — ABNORMAL HIGH (ref 70–99)
Potassium: 4.9 mmol/L (ref 3.5–5.1)
Sodium: 141 mmol/L (ref 135–145)

## 2018-11-15 LAB — CBC
HCT: 44.1 % (ref 39.0–52.0)
Hemoglobin: 14.5 g/dL (ref 13.0–17.0)
MCH: 32.5 pg (ref 26.0–34.0)
MCHC: 32.9 g/dL (ref 30.0–36.0)
MCV: 98.9 fL (ref 80.0–100.0)
Platelets: 238 10*3/uL (ref 150–400)
RBC: 4.46 MIL/uL (ref 4.22–5.81)
RDW: 13.1 % (ref 11.5–15.5)
WBC: 6.6 10*3/uL (ref 4.0–10.5)
nRBC: 0 % (ref 0.0–0.2)

## 2018-11-15 LAB — SARS CORONAVIRUS 2 (TAT 6-24 HRS): SARS Coronavirus 2: NEGATIVE

## 2018-11-15 NOTE — Telephone Encounter (Signed)
Aware. 

## 2018-11-15 NOTE — Telephone Encounter (Signed)
He can get over the counter omega 3 and take twice daily with food.  Sharyn Lull

## 2018-11-15 NOTE — Patient Instructions (Signed)
DUE TO COVID-19 ONLY ONE VISITOR IS ALLOWED TO COME WITH YOU AND STAY IN THE WAITING ROOM ONLY DURING PRE OP AND PROCEDURE DAY OF SURGERY. THE 1 VISITOR MAY VISIT WITH YOU AFTER SURGERY IN YOUR PRIVATE ROOM DURING VISITING HOURS ONLY!  YOU HAVE COMPLETED YOUR COVID-19 TEST. PLEASE BEGIN THE QUARANTINE INSTRUCTIONS AS OUTLINED IN YOUR HANDOUT.                [place]    Your procedure is scheduled on: 11-19-2018    Report to [facility]  Entrance     Report to admitting at 12:00PM     Call this number if you have problems the morning of surgery [place], [place].     Do not eat food After Midnight. YOU MAY HAVE CLEAR LIQUIDS FROM MIDNIGHT UNTIL 8:00AM. At 8:00AM  Nothing by mouth after 8:00AM.   CLEAR LIQUID DIET   Foods Allowed                                                                     Foods Excluded  Coffee and tea, regular and decaf                             liquids that you cannot  Plain Jell-O any favor except red or purple                                           see through such as: Fruit ices (not with fruit pulp)                                     milk, soups, orange juice  Iced Popsicles                                    All solid food Carbonated beverages, regular and diet                                    Cranberry, grape and apple juices Sports drinks like Gatorade Lightly seasoned clear broth or consume(fat free) Sugar, honey syrup  Sample Menu Breakfast                                Lunch                                     Supper Cranberry juice                    Beef broth  Chicken broth Jell-O                                     Grape juice                           Apple juice Coffee or tea                        Jell-O                                      Popsicle            Coffee or tea                        Coffee or tea  _____________________________________________________________________     Take these medicines the morning of surgery with A SIP OF WATER: AMLODIPINE, TAMSULOSIN                                  You may not have any metal on your body including hair pins and              piercings  Do not wear jewelry, make-up, lotions, powders or perfumes, deodorant                         Men may shave face and neck.   Do not bring valuables to the hospital. [place].  Contacts, dentures or bridgework may not be worn into surgery.       Patients discharged the day of surgery will not be allowed to drive home. IF YOU ARE HAVING SURGERY AND GOING HOME THE SAME DAY, YOU MUST HAVE AN ADULT TO DRIVE YOU HOME AND BE WITH YOU FOR 24 HOURS. YOU MAY GO HOME BY TAXI OR UBER OR ORTHERWISE, BUT AN ADULT MUST ACCOMPANY YOU HOME AND STAY WITH YOU FOR 24 HOURS.  Name and phone number of your driver:  Special Instructions: N/A              Please read over the following fact sheets you were given: _____________________________________________________________________             [facility] - Preparing for Surgery Before surgery, you can play an important role.  Because skin is not sterile, your skin needs to be as free of germs as possible.  You can reduce the number of germs on your skin by washing with CHG (chlorahexidine gluconate) soap before surgery.  CHG is an antiseptic cleaner which kills germs and bonds with the skin to continue killing germs even after washing. Please DO NOT use if you have an allergy to CHG or antibacterial soaps.  If your skin becomes reddened/irritated stop using the CHG and inform your nurse when you arrive at Short Stay. Do not shave (including legs and underarms) for at least 48 hours prior to the first CHG shower.  You may shave your face/neck. Please follow these  instructions carefully:  1.  Shower with CHG Soap the night before  surgery and the  morning of Surgery.  2.  If you choose to wash your hair, wash your hair first as usual with your  normal  shampoo.  3.  After you shampoo, rinse your hair and body thoroughly to remove the  shampoo.                           4.  Use CHG as you would any other liquid soap.  You can apply chg directly  to the skin and wash                       Gently with a scrungie or clean washcloth.  5.  Apply the CHG Soap to your body ONLY FROM THE NECK DOWN.   Do not use on face/ open                           Wound or open sores. Avoid contact with eyes, ears mouth and genitals (private parts).                       Wash face,  Genitals (private parts) with your normal soap.             6.  Wash thoroughly, paying special attention to the area where your surgery  will be performed.  7.  Thoroughly rinse your body with warm water from the neck down.  8.  DO NOT shower/wash with your normal soap after using and rinsing off  the CHG Soap.                9.  Pat yourself dry with a clean towel.            10.  Wear clean pajamas.            11.  Place clean sheets on your bed the night of your first shower and do not  sleep with pets. Day of Surgery : Do not apply any lotions/deodorants the morning of surgery.  Please wear clean clothes to the hospital/surgery center.  FAILURE TO FOLLOW THESE INSTRUCTIONS MAY RESULT IN THE CANCELLATION OF YOUR SURGERY PATIENT SIGNATURE_________________________________  NURSE SIGNATURE__________________________________  ________________________________________________________________________

## 2018-11-18 NOTE — H&P (Addendum)
Office Visit Report     11/11/2018   --------------------------------------------------------------------------------   Jesse Barton  MRN: [id number]  DOB: 1940-09-22, 78 year old Male  SSN: [id number]   PRIMARY CARE:  Scott D. Long, PA  REFERRING:  Darla Lesches, NP  PROVIDER:  Festus Aloe, M.D.  TREATING:  Azucena Fallen, NP  LOCATION:  [facility] [phone]     --------------------------------------------------------------------------------   CC: I have kidney stones.  HPI: Jesse Barton is a 78 year-old male established patient who is here for renal calculi.  11/07/18: Jesse Barton has a solitary right kidney and had right flank pain 11/05/2018 and CT revealed a 15 mm stone in right renal pelvis (visible, HU 1230) and a 5 mm RMP stone with minimal hydro. Cr normal at 0.87 and WBC 7.3 (PCP referred to ED). UA with no bac, > 50 rbc and 6-10 wbc.   Today, he is having no problem with eating or drinking and he is voiding well. He is concerned about a right inguinal hernia and doesn't understand all the fuss with his right kidney.   Dr. Lacinda Axon did a left nephrectomy in 2011 for "Kidney cancer". CT scan above did not show any evidence of recurrence.   11/11/18: Patient with above-noted history. He returns today for ongoing evaluation. He continues to have right flank pain which radiates to his abdomen and groin intermittently. His pain tends to be worse with bending, twisting, and lifting. He is not having any significant pain currently. However, he states he did have bothersome symptoms over the weekend. He feels like he may have passed some stone material over the weekend. He continues to feel his urine output is normal. He denies significant exacerbation of lower urinary tract symptoms. He does have nocturia and some urinary urgency and urge incontinence at night. He denies gross hematuria, fever, chills, nausea, or vomiting.   The problem is on the right side. He first stated  noticing pain on 11/05/2018. This is not his first kidney stone. He has had 1 stones prior to getting this one. He is currently having back pain. He denies having flank pain, groin pain, nausea, vomiting, fever, and chills. He has not caught a stone in his urine strainer since his symptoms began.   He has had eswl for treatment of his stones in the past.     ALLERGIES: Ciprofloxacin HCl TABS Tamiflu    MEDICATIONS: Lipitor 40 mg tablet  Tamsulosin Hcl 0.4 mg capsule  Amlodipine Besylate 5 mg tablet  Aspirin Ec 81 mg tablet, delayed release  Cbd Oil  Vascepa 1 gram capsule     GU PSH: ESWL - 2010 Radical nephrectomy (laparoscopic), Left Remove Kidney Stone - 2010       PSH Notes: Lithotripsy, Lithotomy   NON-GU PSH: Cataract surgery, Bilateral     GU PMH: Renal calculus - 11/07/2018, Kidney stone on right side, - 2014 Bladder-neck stenosis/contracture, Bladder neck contracture - 2014 History of urolithiasis, Nephrolithiasis - 2014 Urinary Tract Inf, Unspec site, Pyuria - 2014 History of kidney cancer      PMH Notes:  1898-03-20 00:00:00 - Note: Normal Routine History And Physical Senior Citizen [phone])  2008-10-26 11:22:47 - Note: Arthritis   NON-GU PMH: Acquired absence of kidney - 11/07/2018 Personal history of other diseases of the circulatory system, History of hypertension - 2014 Personal history of other endocrine, nutritional and metabolic disease, History of hypercholesterolemia - 2014 Arthritis Hypercholesterolemia Hypertension    FAMILY HISTORY: Cancer - Father,  Mother Death of family member - Father, Mother Kidney Cancer - Sister Kidney Stones - Sister    Notes: 1 son; 1 daughter   SOCIAL HISTORY: Marital Status: Married Preferred Language: English; Ethnicity: Not Hispanic Or Latino; Race: White Current Smoking Status: Patient does not smoke anymore. Has not smoked since 10/19/1978. Smoked for 35 years.   Tobacco Use Assessment Completed: Used Tobacco in  last 30 days? Uses smokeless tobacco. Drinks 1 drink per week.  Drinks 3 caffeinated drinks per day.     Notes: Occupation:, Previous History Of Smoking, Marital History - Currently Married, Caffeine Use, Alcohol Use   REVIEW OF SYSTEMS:    GU Review Male:   Patient denies frequent urination, hard to postpone urination, burning/ pain with urination, get up at night to urinate, leakage of urine, stream starts and stops, trouble starting your stream, have to strain to urinate , erection problems, and penile pain.  Gastrointestinal (Upper):   Patient denies nausea, vomiting, and indigestion/ heartburn.  Gastrointestinal (Lower):   Patient denies diarrhea and constipation.  Constitutional:   Patient denies fever, night sweats, weight loss, and fatigue.  Skin:   Patient denies skin rash/ lesion and itching.  Eyes:   Patient denies blurred vision and double vision.  Ears/ Nose/ Throat:   Patient denies sore throat and sinus problems.  Hematologic/Lymphatic:   Patient denies swollen glands and easy bruising.  Cardiovascular:   Patient denies leg swelling and chest pains.  Respiratory:   Patient denies cough and shortness of breath.  Endocrine:   Patient denies excessive thirst.  Musculoskeletal:   Patient denies back pain and joint pain.  Neurological:   Patient denies headaches and dizziness.  Psychologic:   Patient denies depression and anxiety.   VITAL SIGNS:      11/11/2018 10:53 AM  Weight 214 lb / 97.07 kg  Height 69 in / 175.26 cm  BP 180/98 mmHg  Pulse 91 /min  Temperature 96.6 F / 35.8 C  BMI 31.6 kg/m   MULTI-SYSTEM PHYSICAL EXAMINATION:    Constitutional: Well-nourished. No physical deformities. Normally developed. Good grooming.  Respiratory: No labored breathing, no use of accessory muscles.   Cardiovascular: Normal temperature, normal extremity pulses, no swelling, no varicosities.  Neurologic / Psychiatric: Oriented to time, oriented to place, oriented to person. No  depression, no anxiety, no agitation.  Gastrointestinal: No mass, no tenderness, no rigidity, non obese abdomen. Mild right CVAT.  Musculoskeletal: Right lumbar tenderness of spine, ribs, pelvis. Normal gait and station of head and neck.     PAST DATA REVIEWED:  Source Of History:  Patient  Lab Test Review:   BMP  Records Review:   Previous Patient Records  Urine Test Review:   Urinalysis  X-Ray Review: C.T. Abdomen/Pelvis: Reviewed Films. Reviewed Report.     PROCEDURES:         Renal Ultrasound (Limited) [id number]  Right Kidney: Length: 14.06 cm Depth: 6.30 cm Cortical Width: 1.61 cm Width: 6.40 cm    Right Kidney/Ureter:  1)Appearance of Mild Hydro----2)1.46cm Renal Pelvis/Proximal Ureteral Stone----3)1.00cm Upper Pole Stone-----4)0.82cm Mid Pole Stone-----5)? 0.78cm Upper Pole Stone and 0.46cm Lower Pole Stone-----6) Several Subcentimeter hyperechoic areas with and without shadowing - ? stones vs vascular calcifications   Bladder:  PVR = 225.62ml-----Unable to visualize right ureteral jet      Patient confirmed No Neulasta OnPro Device.             KUB - [id number]  A single view of the abdomen is  obtained. KUB today continues to show approximately 15 mm opacity in the vicinity of the right UPJ. Additional calculus noted within the confines of the right renal shadow. No obvious opacities noted along the expected anatomical course of the right ureter. Prominent, but normal appearing overlying bowel gas pattern.      Patient confirmed No Neulasta OnPro Device.            Urinalysis w/Scope Dipstick Dipstick Cont'd Micro  Color: Yellow Bilirubin: Neg mg/dL WBC/hpf: 0 - 5/hpf  Appearance: Cloudy Ketones: Neg mg/dL RBC/hpf: 40 - 60/hpf  Specific Gravity: 1.010 Blood: 3+ ery/uL Bacteria: NS (Not Seen)  pH: 6.0 Protein: Neg mg/dL Cystals: NS (Not Seen)  Glucose: Neg mg/dL Urobilinogen: 0.2 mg/dL Casts: NS (Not Seen)    Nitrites: Neg Trichomonas: Not Present    Leukocyte  Esterase: Neg leu/uL Mucous: Not Present      Epithelial Cells: 0 - 5/hpf      Yeast: NS (Not Seen)      Sperm: Not Present    ASSESSMENT:      ICD-10 Details  1 GU:   Renal calculus - N20.0   2 NON-GU:   Acquired absence of kidney - Z90.5    PLAN:           Orders Labs Urine Culture, BMP  X-Rays: Renal Ultrasound (Limited) - right   X-Ray Notes: History:  Hematuria: Yes/No  Patient to see MD after exam: Yes/No  Previous exam: CT / IVP/ US/ KUB/ None  When:  Where:  Diabetic: Yes/ No  BUN/ Creatinine:  Date of last BUN Creatinine:  Weight in pounds:  Allergy- IV Contrast: Yes/ No  Conflicting diabetic meds: Yes/ No  Diabetic Meds:  Prior Authorization #:            Schedule         Document Letter(s):  Created for Patient: Clinical Summary         Notes:   I will send urine for culture today. KUB continues to show an approximatly 15 mm opacity in the vicinity of the right UPJ. Right RUS continues to show some mild hydronephrosis, but this does look stable when compared to recent CT imaging study. I will reassess BMP today. Fortunately, he continues to have good urine output. He is interested in moving forward with intervention. Case reviewed with on call urologist today. As long as his renal function remains stable he does not feel urgent intervention with stent placement today is necessary. Ultimatly, I will review today's visit with his urologist about moving forward with scheduling for URS next available. I will keep him updated. STRICT return precuations to the clinic or [facility] ED were reviewed for decrease in urine output, fevers, or progressive symptoms. He voiced understanding.     * Signed by Azucena Fallen, NP on 11/11/18 at 3:24 PM (EDT)*     The information contained in this medical record document is considered private and confidential patient information. This information can only be used for the medical diagnosis and/or medical services that are being  provided by the patient's selected caregivers. This information can only be distributed outside of the patient's care if the patient agrees and signs waivers of authorization for this information to be sent to an outside source or route.  Add: Cr 0.7 0n 11/11/2018 - I discussed patient with NP Fleck. Also, urine cx was negative.

## 2018-11-18 NOTE — Progress Notes (Signed)
PCP -Darla Lesches , FNP  Cardiologist -   Chest x-ray -  EKG - epic 2020 Stress Test -  ECHO -  Cardiac Cath -   Sleep Study -  CPAP -   Fasting Blood Sugar -  Checks Blood Sugar _____ times a day  Blood Thinner Instructions: Aspirin Instructions:  Last Dose: on hold as of 8-26  Anesthesia review:   Patient denies shortness of breath, fever, cough and chest pain at PAT appointment   Patient verbalized understanding of instructions that were given to them at the PAT appointment. Patient was also instructed that they will need to review over the PAT instructions again at home before surgery.

## 2018-11-19 ENCOUNTER — Ambulatory Visit (HOSPITAL_COMMUNITY): Payer: Medicare Other | Admitting: Certified Registered"

## 2018-11-19 ENCOUNTER — Other Ambulatory Visit: Payer: Self-pay

## 2018-11-19 ENCOUNTER — Encounter (HOSPITAL_COMMUNITY): Payer: Self-pay | Admitting: *Deleted

## 2018-11-19 ENCOUNTER — Ambulatory Visit (HOSPITAL_COMMUNITY)
Admission: RE | Admit: 2018-11-19 | Discharge: 2018-11-19 | Disposition: A | Discharge status: Home / Self Care | Payer: Medicare Other | Attending: Urology | Admitting: Urology

## 2018-11-19 ENCOUNTER — Ambulatory Visit (HOSPITAL_COMMUNITY): Payer: Medicare Other

## 2018-11-19 ENCOUNTER — Encounter (HOSPITAL_COMMUNITY)
Admission: RE | Disposition: A | Discharge status: Home / Self Care | Payer: Self-pay | Source: Home / Self Care | Attending: Urology

## 2018-11-19 DIAGNOSIS — N202 Calculus of kidney with calculus of ureter: Secondary | ICD-10-CM | POA: Insufficient documentation

## 2018-11-19 DIAGNOSIS — M1990 Unspecified osteoarthritis, unspecified site: Secondary | ICD-10-CM | POA: Diagnosis not present

## 2018-11-19 DIAGNOSIS — E7800 Pure hypercholesterolemia, unspecified: Secondary | ICD-10-CM | POA: Diagnosis not present

## 2018-11-19 DIAGNOSIS — Z79899 Other long term (current) drug therapy: Secondary | ICD-10-CM | POA: Insufficient documentation

## 2018-11-19 DIAGNOSIS — Z8051 Family history of malignant neoplasm of kidney: Secondary | ICD-10-CM | POA: Diagnosis not present

## 2018-11-19 DIAGNOSIS — Z7982 Long term (current) use of aspirin: Secondary | ICD-10-CM | POA: Insufficient documentation

## 2018-11-19 DIAGNOSIS — N200 Calculus of kidney: Secondary | ICD-10-CM

## 2018-11-19 DIAGNOSIS — Z87891 Personal history of nicotine dependence: Secondary | ICD-10-CM | POA: Insufficient documentation

## 2018-11-19 DIAGNOSIS — Z85528 Personal history of other malignant neoplasm of kidney: Secondary | ICD-10-CM | POA: Diagnosis not present

## 2018-11-19 DIAGNOSIS — E785 Hyperlipidemia, unspecified: Secondary | ICD-10-CM | POA: Diagnosis not present

## 2018-11-19 DIAGNOSIS — Z905 Acquired absence of kidney: Secondary | ICD-10-CM

## 2018-11-19 DIAGNOSIS — I10 Essential (primary) hypertension: Secondary | ICD-10-CM | POA: Insufficient documentation

## 2018-11-19 HISTORY — PX: CYSTOSCOPY/URETEROSCOPY/HOLMIUM LASER/STENT PLACEMENT: SHX6546

## 2018-11-19 SURGERY — CYSTOSCOPY/URETEROSCOPY/HOLMIUM LASER/STENT PLACEMENT
Anesthesia: General | Laterality: Right

## 2018-11-19 MED ORDER — ONDANSETRON HCL 4 MG/2ML IJ SOLN
INTRAMUSCULAR | Status: DC | PRN
  Administered 2018-11-19: 4 mg via INTRAVENOUS

## 2018-11-19 MED ORDER — IOHEXOL 300 MG/ML  SOLN
INTRAMUSCULAR | Status: DC | PRN
  Administered 2018-11-19: 10 mL via URETHRAL

## 2018-11-19 MED ORDER — MIDAZOLAM HCL 2 MG/2ML IJ SOLN
INTRAMUSCULAR | Status: AC
  Filled 2018-11-19: qty 2

## 2018-11-19 MED ORDER — LIDOCAINE 2% (20 MG/ML) 5 ML SYRINGE
INTRAMUSCULAR | Status: DC | PRN
  Administered 2018-11-19: 60 mg via INTRAVENOUS

## 2018-11-19 MED ORDER — PROPOFOL 10 MG/ML IV BOLUS
INTRAVENOUS | Status: DC | PRN
  Administered 2018-11-19: 120 mg via INTRAVENOUS

## 2018-11-19 MED ORDER — DEXAMETHASONE SODIUM PHOSPHATE 10 MG/ML IJ SOLN
INTRAMUSCULAR | Status: DC | PRN
  Administered 2018-11-19: 10 mg via INTRAVENOUS

## 2018-11-19 MED ORDER — FENTANYL CITRATE (PF) 100 MCG/2ML IJ SOLN: 25.0000 ug | INTRAMUSCULAR | Status: DC | PRN

## 2018-11-19 MED ORDER — FENTANYL CITRATE (PF) 100 MCG/2ML IJ SOLN
INTRAMUSCULAR | Status: DC | PRN
  Administered 2018-11-19: 25 ug via INTRAVENOUS
  Administered 2018-11-19: 50 ug via INTRAVENOUS
  Administered 2018-11-19: 25 ug via INTRAVENOUS

## 2018-11-19 MED ORDER — ACETAMINOPHEN 500 MG PO TABS
1000.0000 mg | ORAL_TABLET | Freq: Once | ORAL | Status: AC
  Administered 2018-11-19: 13:00:00 1000 mg via ORAL

## 2018-11-19 MED ORDER — MIDAZOLAM HCL 5 MG/5ML IJ SOLN
INTRAMUSCULAR | Status: DC | PRN
  Administered 2018-11-19 (×2): 1 mg via INTRAVENOUS

## 2018-11-19 MED ORDER — PHENYLEPHRINE 40 MCG/ML (10ML) SYRINGE FOR IV PUSH (FOR BLOOD PRESSURE SUPPORT)
PREFILLED_SYRINGE | INTRAVENOUS | Status: AC
  Filled 2018-11-19: qty 10

## 2018-11-19 MED ORDER — LACTATED RINGERS IV SOLN
INTRAVENOUS | Status: DC
  Administered 2018-11-19 (×2): via INTRAVENOUS

## 2018-11-19 MED ORDER — GLYCOPYRROLATE PF 0.2 MG/ML IJ SOSY
PREFILLED_SYRINGE | INTRAMUSCULAR | Status: AC
  Filled 2018-11-19: qty 1

## 2018-11-19 MED ORDER — CEFAZOLIN SODIUM-DEXTROSE 2-4 GM/100ML-% IV SOLN
2.0000 g | Freq: Once | INTRAVENOUS | Status: AC
  Administered 2018-11-19: 14:00:00 2 g via INTRAVENOUS
  Filled 2018-11-19: qty 100

## 2018-11-19 MED ORDER — SODIUM CHLORIDE 0.9 % IR SOLN
Status: DC | PRN
  Administered 2018-11-19: 3000 mL

## 2018-11-19 MED ORDER — ACETAMINOPHEN 500 MG PO TABS
ORAL_TABLET | ORAL | Status: AC
  Administered 2018-11-19: 1000 mg via ORAL
  Filled 2018-11-19: qty 2

## 2018-11-19 MED ORDER — FENTANYL CITRATE (PF) 100 MCG/2ML IJ SOLN
INTRAMUSCULAR | Status: AC
  Filled 2018-11-19: qty 2

## 2018-11-19 MED ORDER — LIDOCAINE HCL 2 % IJ SOLN
INTRAMUSCULAR | Status: AC
  Filled 2018-11-19: qty 40

## 2018-11-19 SURGICAL SUPPLY — 21 items
BAG URO CATCHER STRL LF (MISCELLANEOUS) ×3 IMPLANT
BASKET ZERO TIP NITINOL 2.4FR (BASKET) IMPLANT
CATH INTERMIT  6FR 70CM (CATHETERS) ×3 IMPLANT
CATH URET 5FR 28IN CONE TIP (BALLOONS) ×2
CATH URET 5FR 70CM CONE TIP (BALLOONS) ×1 IMPLANT
CLOTH BEACON ORANGE TIMEOUT ST (SAFETY) ×3 IMPLANT
COVER WAND RF STERILE (DRAPES) IMPLANT
FIBER LASER TRAC TIP (UROLOGICAL SUPPLIES) ×3 IMPLANT
GLOVE BIO SURGEON STRL SZ7.5 (GLOVE) ×3 IMPLANT
GOWN STRL REUS W/TWL XL LVL3 (GOWN DISPOSABLE) ×3 IMPLANT
GUIDEWIRE ANG ZIPWIRE 038X150 (WIRE) ×3 IMPLANT
GUIDEWIRE STR DUAL SENSOR (WIRE) ×3 IMPLANT
KIT TURNOVER KIT A (KITS) IMPLANT
MANIFOLD NEPTUNE II (INSTRUMENTS) ×3 IMPLANT
PACK CYSTO (CUSTOM PROCEDURE TRAY) ×3 IMPLANT
SHEATH URETERAL 12FRX28CM (UROLOGICAL SUPPLIES) IMPLANT
SHEATH URETERAL 12FRX35CM (MISCELLANEOUS) ×3 IMPLANT
STENT URET 6FRX26 CONTOUR (STENTS) ×3 IMPLANT
TUBING CONNECTING 10 (TUBING) ×2 IMPLANT
TUBING CONNECTING 10' (TUBING) ×1
WIRE COONS/BENSON .038X145CM (WIRE) IMPLANT

## 2018-11-19 NOTE — Interval H&P Note (Signed)
History and Physical Interval Note:  11/19/2018 12:38 PM  Jesse Barton.  has presented today for surgery, with the diagnosis of RIGHT RENAL PELVIC STONE IN SOLITARY KIDNEY.  The various methods of treatment have been discussed with the patient and family. After consideration of risks, benefits and other options for treatment, the patient has consented to  Procedure(s): CYSTOSCOPY/RETROGRADE/URETEROSCOPY/HOLMIUM LASER/STENT PLACEMENT (Right) as a surgical intervention.  The patient's history has been reviewed, patient examined, no change in status, stable for surgery. We discussed he may need a staged procedure with a pre-stent if we can't get retrograde access with URS in 2-3 weeks.  I have reviewed the patient's chart and labs.  Questions were answered to the patient's satisfaction.  He elects to proceed.    Festus Aloe

## 2018-11-19 NOTE — Transfer of Care (Signed)
Immediate Anesthesia Transfer of Care Note  Patient: Jesse Barton.  Procedure(s) Performed: CYSTOSCOPY/RETROGRADE/URETEROSCOPY/HOLMIUM LASER/STENT PLACEMENT (Right )  Patient Location: PACU  Anesthesia Type:General  Level of Consciousness: awake, alert  and oriented  Airway & Oxygen Therapy: Patient Spontanous Breathing and Patient connected to face mask oxygen  Post-op Assessment: Report given to RN and Post -op Vital signs reviewed and stable  Post vital signs: Reviewed and stable  Last Vitals:  Vitals Value Taken Time  BP 170/78 11/19/18 1515  Temp    Pulse 89 11/19/18 1516  Resp 17 11/19/18 1516  SpO2 100 % 11/19/18 1516  Vitals shown include unvalidated device data.  Last Pain:  Vitals:   11/19/18 1218  TempSrc: Oral  PainSc: 0-No pain         Complications: No apparent anesthesia complications

## 2018-11-19 NOTE — Anesthesia Procedure Notes (Signed)
Procedure Name: LMA Insertion Date/Time: 11/19/2018 1:34 PM Performed by: Markelle Asaro D, CRNA Pre-anesthesia Checklist: Patient identified, Emergency Drugs available, Suction available and Patient being monitored Patient Re-evaluated:Patient Re-evaluated prior to induction Oxygen Delivery Method: Circle system utilized Preoxygenation: Pre-oxygenation with 100% oxygen Induction Type: IV induction LMA: LMA inserted LMA Size: 5.0 Number of attempts: 1 Placement Confirmation: CO2 detector,  positive ETCO2 and breath sounds checked- equal and bilateral Tube secured with: Tape Dental Injury: Teeth and Oropharynx as per pre-operative assessment

## 2018-11-19 NOTE — Op Note (Signed)
Preoperative diagnosis: Right renal stones, solitary kidney Postoperative diagnosis: Same  Procedure: Cystoscopy with right retrograde pyelogram, right ureteroscopy with holmium laser lithotripsy, right stent placement  Surgeon: Junious Silk  Anesthesia: General  Indication for procedure: Jesse Barton is a 78 year old male with a solitary right kidney has been having right-sided pain and a CT scan revealed a large stone at his UPJ and 2 smaller stones in the midpole and upper pole.  He was brought today for ureteroscopy.  Findings: The bladder and the urethra were unremarkable.  Right retrograde pyelogram-this outlined a single ureter single collecting system unit.  There was some narrowing of the ureter at the iliac and then some dilation of the ureter and then narrowing of the ureter again at the UPJ and then some mild to moderate dilation of the collecting system.  Large stone in the renal pelvis visualized is filling defect.  On scout imaging the UPJ and the 2 calyceal stones were noted.  On ureteroscopy there was a narrowing or tortuosity of the mid ureter that initially would not allow the access sheath to pass.  And then interestingly at the right UPJ it narrowed again and was not obstructive but the dual channel digital ureteroscope would not pass, the single channel digital ureteroscope just passed through the UPJ.  It had to be guided over a wire adjacent to the safety wire to get it to pass.  The UPJ stone and dropped into the midpole there was some debris and matrix stones in the lower pole.  There was a hard stone in the upper pole.  The stones were very hard.  We went through 3 laser fibers and were able to fragment about two thirds of the stone volume.  Description of procedure: After consent was obtained patient brought to the operating room.  After adequate anesthesia he was placed lithotomy position and prepped and draped in the usual sterile fashion.  A timeout was performed to confirm  the patient and procedure.  The cystoscope was passed per urethra and the bladder carefully inspected.  The right ureteral orifice was then cannulated with a 6 [place] open-ended catheter and retrograde injection of contrast was performed.  I then advanced a sensor wire and coiled out in the collecting system.  I then placed the ureteral access sheath but it got hung up in the mid to distal ureter.  I was going to use it to place the second wire and then after trying to advance the wire the sheath backed out into the bladder.  Therefore the cystoscope was repassed and the Glidewire advanced under direct vision but curiously this coiled at the UPJ and came back down into the ureter.  I decided to leave it there and passed the ureteral access sheath again over the Glidewire but the access sheath again got hung up at the mid to distal ureter and would not advance.  Therefore I thought it was best to take a direct look at the ureter and passed the semirigid scope adjacent to the wires and there was some tortuosity of the mid to distal ureter holding up the access sheath but no obstruction or stone.  Therefore I backed the semirigid out and then passed a 6 [place] open-ended catheter up into the proximal ureter to straighten out the Glidewire and get it into the collecting system.  Once had the Glidewire in the collecting system passed the access sheath adjacent to the Glidewire over the sensor wire and there was again some resistance at the  mid to distal ureter at the tortuosity but I was able to you get the access sheath up into the proximal ureter without any difficulty.  I then passed the dual channel digital ureteroscope into the proximal ureter but there was narrowing at the UPJ which would not allow the ureteroscope into the collecting system.  I passed the sensor wire back through the digital scope to piggyback over but could not get the dual channel through the UPJ.  There did not appear to be a significant  obstruction but the UPJ definitely narrowed to about 10 [place].  Therefore I backed out the dual channel and passed up the single channel digital and still had to piggyback it over a wire but it was able to go into the collecting system.  Once the UPJ had been slightly dilated in this fashion I could get the single channel in and out through the UPJ.  I then approached the stones.  The large stone had fallen into the midpole calyx.  There was some strange matrix type stones or possibly old clot in the lower pole.  But 3 of these broke up and looked more like powder or fibrin.  The stones were not really appreciated on CT scan.  I then approached the UPJ stone which to fallen into the midpole calyx.  A 200 m laser fiber used at up to 1 and 10.  This was a very dense stone.  It chewed through the lasers.  We ended up using 3 lasers.  I was able to get up to the upper pole stone and break it into some pieces.  I think his UPJ was too small to pull any of these pieces out and given his solitary kidney and about a third of the large stone remaining in other fragments I thought he needed a staged procedure.  Therefore the access sheath was backed out onto the ureteroscope.  I inspected the collecting system again in the renal pelvis and UPJ the proximal ureter all noted to be normal.  I then inspected the remainder of the ureter and again all noted to be normal without injury or stone fragment.  The Glidewire was backloaded on the cystoscope and a 6 x 20 cm stent was advanced.  A good coil was noted in the midpole calyx and a good coil in the bladder.  The bladder was drained and the scope removed.  He was awakened and taken recovery room in stable condition.  Complications: None  Blood loss: Minimal  Specimens: None  Drains: 6 x 26 cm right ureteral stent without string  Disposition: Patient stable to PACU

## 2018-11-19 NOTE — Anesthesia Preprocedure Evaluation (Addendum)
Anesthesia Evaluation  Patient identified by MRN, date of birth, ID band Patient awake    Reviewed: Allergy & Precautions, NPO status , Patient's Chart, lab work & pertinent test results  Airway Mallampati: III  TM Distance: >3 FB Neck ROM: Full  Mouth opening: Limited Mouth Opening  Dental no notable dental hx. (+) Edentulous Upper, Poor Dentition   Pulmonary neg pulmonary ROS, Current Smoker and Patient abstained from smoking.,    Pulmonary exam normal breath sounds clear to auscultation       Cardiovascular hypertension, Pt. on medications negative cardio ROS Normal cardiovascular exam Rhythm:Regular Rate:Normal     Neuro/Psych negative neurological ROS  negative psych ROS   GI/Hepatic negative GI ROS, Neg liver ROS,   Endo/Other  negative endocrine ROS  Renal/GU negative Renal ROS  negative genitourinary   Musculoskeletal negative musculoskeletal ROS (+)   Abdominal   Peds  Hematology negative hematology ROS (+)   Anesthesia Other Findings   Reproductive/Obstetrics                            Anesthesia Physical Anesthesia Plan  ASA: II  Anesthesia Plan: General   Post-op Pain Management:    Induction: Intravenous  PONV Risk Score and Plan: Ondansetron and Dexamethasone  Airway Management Planned: LMA  Additional Equipment:   Intra-op Plan:   Post-operative Plan: Extubation in OR  Informed Consent: I have reviewed the patients History and Physical, chart, labs and discussed the procedure including the risks, benefits and alternatives for the proposed anesthesia with the patient or authorized representative who has indicated his/her understanding and acceptance.     Dental advisory given  Plan Discussed with: CRNA  Anesthesia Plan Comments:         Anesthesia Quick Evaluation

## 2018-11-19 NOTE — Discharge Instructions (Signed)
Ureteral Stent Implantation, Care After This sheet gives you information about how to care for yourself after your procedure. Your health care provider may also give you more specific instructions. If you have problems or questions, contact your health care provider.  Dr. Lyndal Rainbow office will call soon to plan the next stent/stone removal procedure to be done in 2 to 3 weeks.  What can I expect after the procedure? After the procedure, it is common to have:  Nausea.  Mild pain when you urinate. You may feel this pain in your lower back or lower abdomen. The pain should stop within a few minutes after you urinate. This may last for up to 1 week.  A small amount of blood in your urine for several days. Follow these instructions at home: Medicines  Take over-the-counter and prescription medicines only as told by your health care provider.  If you were prescribed an antibiotic medicine, take it as told by your health care provider. Do not stop taking the antibiotic even if you start to feel better.  Do not drive for 24 hours if you were given a sedative during your procedure.  Ask your health care provider if the medicine prescribed to you requires you to avoid driving or using heavy machinery. Activity  Rest as told by your health care provider.  Avoid sitting for a long time without moving. Get up to take short walks every 1-2 hours. This is important to improve blood flow and breathing. Ask for help if you feel weak or unsteady.  Return to your normal activities as told by your health care provider. Ask your health care provider what activities are safe for you. General instructions   Watch for any blood in your urine. Call your health care provider if the amount of blood in your urine increases.  If you have a catheter: ? Follow instructions from your health care provider about taking care of your catheter and collection bag. ? Do not take baths, swim, or use a hot tub until  your health care provider approves. Ask your health care provider if you may take showers. You may only be allowed to take sponge baths.  Drink enough fluid to keep your urine pale yellow.  Do not use any products that contain nicotine or tobacco, such as cigarettes, e-cigarettes, and chewing tobacco. These can delay healing after surgery. If you need help quitting, ask your health care provider.  Keep all follow-up visits as told by your health care provider. This is important. Contact a health care provider if:  You have pain that gets worse or does not get better with medicine, especially pain when you urinate.  You have difficulty urinating.  You feel nauseous or you vomit repeatedly during a period of more than 2 days after the procedure. Get help right away if:  Your urine is dark red or has blood clots in it.  You are leaking urine (have incontinence).  The end of the stent comes out of your urethra.  You cannot urinate.  You have sudden, sharp, or severe pain in your abdomen or lower back.  You have a fever.  You have swelling or pain in your legs.  You have difficulty breathing. Summary  After the procedure, it is common to have mild pain when you urinate that goes away within a few minutes after you urinate. This may last for up to 1 week.  Watch for any blood in your urine. Call your health care provider if the  amount of blood in your urine increases.  Take over-the-counter and prescription medicines only as told by your health care provider.  Drink enough fluid to keep your urine pale yellow. This information is not intended to replace advice given to you by your health care provider. Make sure you discuss any questions you have with your health care provider. Document Released: 11/06/2012 Document Revised: 12/11/2017 Document Reviewed: 12/12/2017 Elsevier Patient Education  2020 [employer].

## 2018-11-19 NOTE — Progress Notes (Signed)
I checked on Jesse Barton postop and he was doing well.  I told him I had a little bit of trouble getting into the kidney and the stones in the kidney were very hard.  He said that did not surprise him that he tried shockwave lithotripsy in the past and it failed.  He said he understood why we needed to go back for a second procedure.

## 2018-11-20 ENCOUNTER — Encounter (HOSPITAL_COMMUNITY): Payer: Self-pay | Admitting: Urology

## 2018-11-20 NOTE — Anesthesia Postprocedure Evaluation (Signed)
Anesthesia Post Note  Patient: Jesse Barton.  Procedure(s) Performed: CYSTOSCOPY/RETROGRADE/URETEROSCOPY/HOLMIUM LASER/STENT PLACEMENT (Right )     Patient location during evaluation: PACU Anesthesia Type: General Level of consciousness: awake and alert Pain management: pain level controlled Vital Signs Assessment: post-procedure vital signs reviewed and stable Respiratory status: spontaneous breathing, nonlabored ventilation, respiratory function stable and patient connected to nasal cannula oxygen Cardiovascular status: blood pressure returned to baseline and stable Postop Assessment: no apparent nausea or vomiting Anesthetic complications: no    Last Vitals:  Vitals:   11/19/18 1545 11/19/18 1603  BP: (!) 174/91   Pulse: 79   Resp: 19   Temp:  36.9 C  SpO2: 98%     Last Pain:  Vitals:   11/19/18 1603  TempSrc: Oral  PainSc: 0-No pain                 Marquize Seib L Jaysten Essner

## 2018-12-02 ENCOUNTER — Other Ambulatory Visit: Payer: Self-pay | Admitting: Urology

## 2018-12-11 ENCOUNTER — Encounter (HOSPITAL_BASED_OUTPATIENT_CLINIC_OR_DEPARTMENT_OTHER): Payer: Self-pay | Admitting: *Deleted

## 2018-12-12 ENCOUNTER — Encounter (HOSPITAL_BASED_OUTPATIENT_CLINIC_OR_DEPARTMENT_OTHER): Payer: Self-pay | Admitting: *Deleted

## 2018-12-12 ENCOUNTER — Other Ambulatory Visit: Payer: Self-pay

## 2018-12-12 NOTE — Progress Notes (Signed)
Spoke with patient via telephone for pre op interview. NPO after MN. Patient to take Norvasc with a sip of water AM of surgery. Current EKG in Epic. Arrival time 0900.

## 2018-12-13 ENCOUNTER — Other Ambulatory Visit (HOSPITAL_COMMUNITY)
Admission: RE | Admit: 2018-12-13 | Discharge: 2018-12-13 | Disposition: A | Discharge status: Home / Self Care | Payer: Medicare Other | Source: Ambulatory Visit | Attending: Urology | Admitting: Urology

## 2018-12-13 DIAGNOSIS — Z20828 Contact with and (suspected) exposure to other viral communicable diseases: Secondary | ICD-10-CM | POA: Diagnosis not present

## 2018-12-13 DIAGNOSIS — Z01812 Encounter for preprocedural laboratory examination: Secondary | ICD-10-CM | POA: Diagnosis not present

## 2018-12-14 LAB — NOVEL CORONAVIRUS, NAA (HOSP ORDER, SEND-OUT TO REF LAB; TAT 18-24 HRS): SARS-CoV-2, NAA: NOT DETECTED

## 2018-12-17 ENCOUNTER — Encounter (HOSPITAL_BASED_OUTPATIENT_CLINIC_OR_DEPARTMENT_OTHER)
Admission: RE | Disposition: A | Discharge status: Home / Self Care | Payer: Self-pay | Source: Home / Self Care | Attending: Urology

## 2018-12-17 ENCOUNTER — Ambulatory Visit (HOSPITAL_BASED_OUTPATIENT_CLINIC_OR_DEPARTMENT_OTHER): Payer: Medicare Other | Admitting: Anesthesiology

## 2018-12-17 ENCOUNTER — Encounter (HOSPITAL_BASED_OUTPATIENT_CLINIC_OR_DEPARTMENT_OTHER): Payer: Self-pay | Admitting: Anesthesiology

## 2018-12-17 ENCOUNTER — Ambulatory Visit (HOSPITAL_BASED_OUTPATIENT_CLINIC_OR_DEPARTMENT_OTHER)
Admission: RE | Admit: 2018-12-17 | Discharge: 2018-12-17 | Disposition: A | Discharge status: Home / Self Care | Payer: Medicare Other | Attending: Urology | Admitting: Urology

## 2018-12-17 DIAGNOSIS — Z806 Family history of leukemia: Secondary | ICD-10-CM | POA: Diagnosis not present

## 2018-12-17 DIAGNOSIS — F17290 Nicotine dependence, other tobacco product, uncomplicated: Secondary | ICD-10-CM | POA: Insufficient documentation

## 2018-12-17 DIAGNOSIS — Z6830 Body mass index (BMI) 30.0-30.9, adult: Secondary | ICD-10-CM | POA: Insufficient documentation

## 2018-12-17 DIAGNOSIS — Z85528 Personal history of other malignant neoplasm of kidney: Secondary | ICD-10-CM | POA: Diagnosis not present

## 2018-12-17 DIAGNOSIS — Z887 Allergy status to serum and vaccine status: Secondary | ICD-10-CM | POA: Insufficient documentation

## 2018-12-17 DIAGNOSIS — E669 Obesity, unspecified: Secondary | ICD-10-CM | POA: Diagnosis not present

## 2018-12-17 DIAGNOSIS — Z8051 Family history of malignant neoplasm of kidney: Secondary | ICD-10-CM | POA: Diagnosis not present

## 2018-12-17 DIAGNOSIS — Z881 Allergy status to other antibiotic agents status: Secondary | ICD-10-CM | POA: Diagnosis not present

## 2018-12-17 DIAGNOSIS — Z905 Acquired absence of kidney: Secondary | ICD-10-CM

## 2018-12-17 DIAGNOSIS — N200 Calculus of kidney: Secondary | ICD-10-CM | POA: Insufficient documentation

## 2018-12-17 DIAGNOSIS — Q600 Renal agenesis, unilateral: Secondary | ICD-10-CM | POA: Diagnosis not present

## 2018-12-17 DIAGNOSIS — Z79899 Other long term (current) drug therapy: Secondary | ICD-10-CM | POA: Diagnosis not present

## 2018-12-17 DIAGNOSIS — Z87442 Personal history of urinary calculi: Secondary | ICD-10-CM | POA: Insufficient documentation

## 2018-12-17 DIAGNOSIS — Z9849 Cataract extraction status, unspecified eye: Secondary | ICD-10-CM | POA: Diagnosis not present

## 2018-12-17 DIAGNOSIS — I10 Essential (primary) hypertension: Secondary | ICD-10-CM | POA: Diagnosis not present

## 2018-12-17 DIAGNOSIS — Z7982 Long term (current) use of aspirin: Secondary | ICD-10-CM | POA: Insufficient documentation

## 2018-12-17 DIAGNOSIS — Z818 Family history of other mental and behavioral disorders: Secondary | ICD-10-CM | POA: Insufficient documentation

## 2018-12-17 DIAGNOSIS — M5430 Sciatica, unspecified side: Secondary | ICD-10-CM | POA: Insufficient documentation

## 2018-12-17 DIAGNOSIS — Z809 Family history of malignant neoplasm, unspecified: Secondary | ICD-10-CM | POA: Insufficient documentation

## 2018-12-17 DIAGNOSIS — E785 Hyperlipidemia, unspecified: Secondary | ICD-10-CM | POA: Diagnosis not present

## 2018-12-17 DIAGNOSIS — N202 Calculus of kidney with calculus of ureter: Secondary | ICD-10-CM | POA: Diagnosis present

## 2018-12-17 HISTORY — PX: CYSTOSCOPY/URETEROSCOPY/HOLMIUM LASER/STENT PLACEMENT: SHX6546

## 2018-12-17 HISTORY — DX: Personal history of urinary calculi: Z87.442

## 2018-12-17 SURGERY — CYSTOSCOPY/URETEROSCOPY/HOLMIUM LASER/STENT PLACEMENT
Anesthesia: General | Site: Ureter | Laterality: Right

## 2018-12-17 MED ORDER — SODIUM CHLORIDE 0.9 % IR SOLN
Status: DC | PRN
  Administered 2018-12-17 (×2): 3000 mL via INTRAVESICAL

## 2018-12-17 MED ORDER — PROPOFOL 10 MG/ML IV BOLUS
INTRAVENOUS | Status: AC
  Filled 2018-12-17: qty 20

## 2018-12-17 MED ORDER — FENTANYL CITRATE (PF) 100 MCG/2ML IJ SOLN
25.0000 ug | INTRAMUSCULAR | Status: DC | PRN
  Filled 2018-12-17: qty 1

## 2018-12-17 MED ORDER — DEXAMETHASONE SODIUM PHOSPHATE 10 MG/ML IJ SOLN
INTRAMUSCULAR | Status: AC
  Filled 2018-12-17: qty 1

## 2018-12-17 MED ORDER — ONDANSETRON HCL 4 MG/2ML IJ SOLN
INTRAMUSCULAR | Status: DC | PRN
  Administered 2018-12-17: 4 mg via INTRAVENOUS

## 2018-12-17 MED ORDER — ONDANSETRON HCL 4 MG/2ML IJ SOLN
INTRAMUSCULAR | Status: AC
  Filled 2018-12-17: qty 2

## 2018-12-17 MED ORDER — ACETAMINOPHEN 500 MG PO TABS
ORAL_TABLET | ORAL | Status: AC
  Filled 2018-12-17: qty 2

## 2018-12-17 MED ORDER — FENTANYL CITRATE (PF) 100 MCG/2ML IJ SOLN
INTRAMUSCULAR | Status: DC | PRN
  Administered 2018-12-17 (×2): 25 ug via INTRAVENOUS
  Administered 2018-12-17: 50 ug via INTRAVENOUS

## 2018-12-17 MED ORDER — DEXAMETHASONE SODIUM PHOSPHATE 10 MG/ML IJ SOLN
INTRAMUSCULAR | Status: DC | PRN
  Administered 2018-12-17: 4 mg via INTRAVENOUS

## 2018-12-17 MED ORDER — OXYCODONE HCL 5 MG PO TABS
ORAL_TABLET | ORAL | Status: AC
  Filled 2018-12-17: qty 1

## 2018-12-17 MED ORDER — LIDOCAINE 2% (20 MG/ML) 5 ML SYRINGE
INTRAMUSCULAR | Status: DC | PRN
  Administered 2018-12-17: 100 mg via INTRAVENOUS

## 2018-12-17 MED ORDER — OXYCODONE HCL 5 MG PO TABS
5.0000 mg | ORAL_TABLET | Freq: Once | ORAL | Status: DC
  Filled 2018-12-17: qty 1

## 2018-12-17 MED ORDER — ONDANSETRON HCL 4 MG/2ML IJ SOLN
4.0000 mg | Freq: Once | INTRAMUSCULAR | Status: DC | PRN
  Filled 2018-12-17: qty 2

## 2018-12-17 MED ORDER — FENTANYL CITRATE (PF) 100 MCG/2ML IJ SOLN
INTRAMUSCULAR | Status: AC
  Filled 2018-12-17: qty 2

## 2018-12-17 MED ORDER — CEFAZOLIN SODIUM-DEXTROSE 2-4 GM/100ML-% IV SOLN
INTRAVENOUS | Status: AC
  Filled 2018-12-17: qty 100

## 2018-12-17 MED ORDER — IOHEXOL 300 MG/ML  SOLN
INTRAMUSCULAR | Status: DC | PRN
  Administered 2018-12-17: 23 mL via URETHRAL

## 2018-12-17 MED ORDER — PROPOFOL 10 MG/ML IV BOLUS
INTRAVENOUS | Status: DC | PRN
  Administered 2018-12-17: 200 mg via INTRAVENOUS

## 2018-12-17 MED ORDER — LACTATED RINGERS IV SOLN
INTRAVENOUS | Status: DC
  Administered 2018-12-17: 10:00:00 via INTRAVENOUS
  Filled 2018-12-17: qty 1000

## 2018-12-17 MED ORDER — CEFAZOLIN SODIUM-DEXTROSE 2-4 GM/100ML-% IV SOLN
2.0000 g | INTRAVENOUS | Status: AC
  Administered 2018-12-17: 2 g via INTRAVENOUS
  Filled 2018-12-17: qty 100

## 2018-12-17 MED ORDER — LIDOCAINE 2% (20 MG/ML) 5 ML SYRINGE
INTRAMUSCULAR | Status: AC
  Filled 2018-12-17: qty 5

## 2018-12-17 MED ORDER — ACETAMINOPHEN 500 MG PO TABS
1000.0000 mg | ORAL_TABLET | Freq: Once | ORAL | Status: AC
  Administered 2018-12-17: 10:00:00 1000 mg via ORAL
  Filled 2018-12-17: qty 2

## 2018-12-17 SURGICAL SUPPLY — 23 items
BAG DRAIN URO-CYSTO SKYTR STRL (DRAIN) ×3 IMPLANT
BASKET ZERO TIP NITINOL 2.4FR (BASKET) ×3 IMPLANT
CATH URET 5FR 28IN CONE TIP (BALLOONS)
CATH URET 5FR 28IN OPEN ENDED (CATHETERS) ×3 IMPLANT
CATH URET 5FR 70CM CONE TIP (BALLOONS) IMPLANT
CATH URET DUAL LUMEN 6-10FR 50 (CATHETERS) IMPLANT
CLOTH BEACON ORANGE TIMEOUT ST (SAFETY) ×3 IMPLANT
FIBER LASER FLEXIVA 365 (UROLOGICAL SUPPLIES) ×9 IMPLANT
GLOVE BIO SURGEON STRL SZ7.5 (GLOVE) ×3 IMPLANT
GLOVE BIO SURGEON STRL SZ8 (GLOVE) IMPLANT
GOWN STRL REUS W/TWL LRG LVL3 (GOWN DISPOSABLE) ×3 IMPLANT
GUIDEWIRE ANG ZIPWIRE 038X150 (WIRE) ×3 IMPLANT
GUIDEWIRE STR DUAL SENSOR (WIRE) ×3 IMPLANT
IV NS IRRIG 3000ML ARTHROMATIC (IV SOLUTION) ×6 IMPLANT
KIT TURNOVER CYSTO (KITS) ×3 IMPLANT
MANIFOLD NEPTUNE II (INSTRUMENTS) ×3 IMPLANT
NS IRRIG 500ML POUR BTL (IV SOLUTION) ×3 IMPLANT
PACK CYSTO (CUSTOM PROCEDURE TRAY) ×3 IMPLANT
SHEATH URETERAL 12FRX35CM (MISCELLANEOUS) ×3 IMPLANT
STENT URET 6FRX26 CONTOUR (STENTS) ×3 IMPLANT
TUBE CONNECTING 12'X1/4 (SUCTIONS) ×1
TUBE CONNECTING 12X1/4 (SUCTIONS) ×2 IMPLANT
TUBING UROLOGY SET (TUBING) ×3 IMPLANT

## 2018-12-17 NOTE — Discharge Instructions (Signed)
Ureteral Stent Implantation, Care After This sheet gives you information about how to care for yourself after your procedure. Your health care provider may also give you more specific instructions. If you have problems or questions, contact your health care provider.  Stent removal: Be sure to follow-up with Dr. Junious Silk for Xray and possible/likely stent removal  What can I expect after the procedure? After the procedure, it is common to have:  Nausea.  Mild pain when you urinate. You may feel this pain in your lower back or lower abdomen. The pain should stop within a few minutes after you urinate. This may last for up to 1 week.  A small amount of blood in your urine for several days. Follow these instructions at home: Medicines  Take over-the-counter and prescription medicines only as told by your health care provider.  If you were prescribed an antibiotic medicine, take it as told by your health care provider. Do not stop taking the antibiotic even if you start to feel better.  Do not drive for 24 hours if you were given a sedative during your procedure.  Ask your health care provider if the medicine prescribed to you requires you to avoid driving or using heavy machinery. Activity  Rest as told by your health care provider.  Avoid sitting for a long time without moving. Get up to take short walks every 1-2 hours. This is important to improve blood flow and breathing. Ask for help if you feel weak or unsteady.  Return to your normal activities as told by your health care provider. Ask your health care provider what activities are safe for you. General instructions   Watch for any blood in your urine. Call your health care provider if the amount of blood in your urine increases.  If you have a catheter: ? Follow instructions from your health care provider about taking care of your catheter and collection bag. ? Do not take baths, swim, or use a hot tub until your health care  provider approves. Ask your health care provider if you may take showers. You may only be allowed to take sponge baths.  Drink enough fluid to keep your urine pale yellow.  Do not use any products that contain nicotine or tobacco, such as cigarettes, e-cigarettes, and chewing tobacco. These can delay healing after surgery. If you need help quitting, ask your health care provider.  Keep all follow-up visits as told by your health care provider. This is important. Contact a health care provider if:  You have pain that gets worse or does not get better with medicine, especially pain when you urinate.  You have difficulty urinating.  You feel nauseous or you vomit repeatedly during a period of more than 2 days after the procedure. Get help right away if:  Your urine is dark red or has blood clots in it.  You are leaking urine (have incontinence).  The end of the stent comes out of your urethra.  You cannot urinate.  You have sudden, sharp, or severe pain in your abdomen or lower back.  You have a fever.  You have swelling or pain in your legs.  You have difficulty breathing. Summary  After the procedure, it is common to have mild pain when you urinate that goes away within a few minutes after you urinate. This may last for up to 1 week.  Watch for any blood in your urine. Call your health care provider if the amount of blood in your urine  increases.  Take over-the-counter and prescription medicines only as told by your health care provider.  Drink enough fluid to keep your urine pale yellow. This information is not intended to replace advice given to you by your health care provider. Make sure you discuss any questions you have with your health care provider. Document Released: 11/06/2012 Document Revised: 12/11/2017 Document Reviewed: 12/12/2017 Elsevier Patient Education  [place] Instructions  Activity: Get plenty of rest for the  remainder of the day. A responsible individual must stay with you for 24 hours following the procedure.  For the next 24 hours, DO NOT: -Drive a car -Paediatric nurse -Drink alcoholic beverages -Take any medication unless instructed by your physician -Make any legal decisions or sign important papers.  Meals: Start with liquid foods such as gelatin or soup. Progress to regular foods as tolerated. Avoid greasy, spicy, heavy foods. If nausea and/or vomiting occur, drink only clear liquids until the nausea and/or vomiting subsides. Call your physician if vomiting continues.  Special Instructions/Symptoms: Your throat may feel dry or sore from the anesthesia or the breathing tube placed in your throat during surgery. If this causes discomfort, gargle with warm salt water. The discomfort should disappear within 24 hours.

## 2018-12-17 NOTE — Transfer of Care (Signed)
Immediate Anesthesia Transfer of Care Note  Patient: Jesse Barton.  Procedure(s) Performed: CYSTOSCOP RIGHT  /URETEROSCOPY/HOLMIUM LASER/STENT EXCHANGE (Right Ureter)  Patient Location: PACU  Anesthesia Type:General  Level of Consciousness: drowsy  Airway & Oxygen Therapy: Patient Spontanous Breathing and Patient connected to nasal cannula oxygen  Post-op Assessment: Report given to RN  Post vital signs: Reviewed and stable  Last Vitals:  Vitals Value Taken Time  BP 147/80 12/17/18 1155  Temp    Pulse 81 12/17/18 1156  Resp 17 12/17/18 1156  SpO2 93 % 12/17/18 1156  Vitals shown include unvalidated device data.  Last Pain:  Vitals:   12/17/18 0910  TempSrc: Oral  PainSc: 8       Patients Stated Pain Goal: 5 ([id number])  Complications: No apparent anesthesia complications

## 2018-12-17 NOTE — Op Note (Signed)
Preoperative diagnosis: Solitary right kidney, right renal stones Postoperative diagnosis: Same  Procedure: Cystoscopy with right ureteroscopy, holmium laser lithotripsy, stone basket extraction, right ureteral stent exchange  Surgeon: Junious Silk  Anesthesia: General  Indication for procedure: [name] is a 78 year old male with a large UPJ stone and a solitary kidney.  The stone was very hard and he was very dense and he has a tight right UPJ making access difficult.  He was brought back today for staged ureteroscopy.  Findings: Even with the stent the right UPJ was narrow and I needed to guide the scope and over a second wire.  I used the access sheath to gently dilate the UPJ.  The stone again was very dense and went through 3 -- 500 m laser fibers.  The remaining fragments were 1 to 3 mm.  Several were extracted from the upper pole and lower pole and none noted to be significant in size.  No obvious large fragments on fluoroscopy at the end of the case.\  Description of procedure: After consent was obtained patient brought to the operating room.  After adequate anesthesia was placed in lithotomy position and prepped and draped in the usual sterile fashion.  A timeout was performed to confirm the patient and procedure.  The cystoscope was passed per urethra and the bladder drained.  The urine was clear.  The right ureteral stent grasped and removed through the urethral meatus and a Glidewire was advanced through the stent and coiled in the upper pole collecting system.  I then passed an access sheath which went without difficulty and used that to get 2 wires in place.  I went adjacent to the [place] leaving it as the safety wire.  At the UPJ there was again narrow in the past the wire through the ureteroscope into the collecting system to help guide the scope which guided it into the collecting system.  The largest stone remaining was about two thirds of the UPJ stone it was pushed into the upper  pole.  There was some fragments on fluoroscopy in the lower pole and these were fragmented 0.8 and 8 and 0.5 and 20 and a very small pieces.  They were no longer readily visible on fluoroscopy.  Then attention to the upper pole stone it was treated again at 0.8 and 8 which seemed to work the best.  It dusted well but went through 2 laser fibers and then it was finished off with the third.  I then treated it prolonged with 0.8 and 8 and 0.520 into small pieces.  Because of the narrowed UPJ it was not easy going in and out.  I then took a 0 tip basket and sampled several times in the upper pole and only had 1 to 2 mm pieces and then sampled again in the lower pole and only 1 to 2 mm pieces.  These pieces came out easily through the UPJ and the sheath so I am confident he will pass many of these pieces.  Given the solitary kidney, multiple stone fragments in the narrowing of the UPJ I thought it best to leave a stent, get an x-ray in the office and then take it out if any remaining fragments are small on the x-ray.  The access sheath was backed out on the ureteroscope and the collecting system, renal pelvis, UPJ were inspected on the way out noted to be normal.  There was no injury.  The ureter was inspected and noted to be free of injury  or any stone fragment.  The Glidewire was backloaded on the cystoscope and a 6 x 26 cm stent advanced.  The wire was removed with a good coil seen in the kidney and a good coil in the bladder.  The bladder was drained and the scope removed.  He was awakened and taken to the recovery room in stable condition.  Complications: None  Blood loss: Minimal  Specimens: Stone fragments to office lab  Drains: 6 x 26 cm right ureteral stent  Disposition: Patient stable to PACU- I will have him return in a week or 2 with a KUB for likely stent removal.

## 2018-12-17 NOTE — Anesthesia Procedure Notes (Signed)
Procedure Name: LMA Insertion Date/Time: 12/17/2018 10:06 AM Performed by: Bonney Aid, CRNA Pre-anesthesia Checklist: Patient identified, Emergency Drugs available, Suction available and Patient being monitored Patient Re-evaluated:Patient Re-evaluated prior to induction Oxygen Delivery Method: Circle system utilized Preoxygenation: Pre-oxygenation with 100% oxygen Induction Type: IV induction Ventilation: Mask ventilation without difficulty LMA: LMA inserted LMA Size: 5.0 Number of attempts: 1 Airway Equipment and Method: Bite block Placement Confirmation: positive ETCO2 Tube secured with: Tape Dental Injury: Teeth and Oropharynx as per pre-operative assessment

## 2018-12-17 NOTE — Anesthesia Preprocedure Evaluation (Addendum)
Anesthesia Evaluation  Patient identified by MRN, date of birth, ID band Patient awake    Reviewed: Allergy & Precautions, NPO status , Patient's Chart, lab work & pertinent test results  Airway Mallampati: III  TM Distance: >3 FB Neck ROM: Full    Dental  (+) Poor Dentition, Missing   Pulmonary Current SmokerPatient did not abstain from smoking.,    Pulmonary exam normal breath sounds clear to auscultation       Cardiovascular hypertension, Pt. on medications Normal cardiovascular exam Rhythm:Regular Rate:Normal  ECG: NSR, rate 82   Neuro/Psych negative neurological ROS  negative psych ROS   GI/Hepatic negative GI ROS, Neg liver ROS,   Endo/Other  negative endocrine ROS  Renal/GU negative Renal ROS     Musculoskeletal Sciatic nerve pain   Abdominal (+) + obese,   Peds  Hematology negative hematology ROS (+)   Anesthesia Other Findings RIGHT RENAL STONES  Reproductive/Obstetrics                            Anesthesia Physical Anesthesia Plan  ASA: II  Anesthesia Plan: General   Post-op Pain Management:    Induction: Intravenous  PONV Risk Score and Plan: 2 and Ondansetron, Dexamethasone and Treatment may vary due to age or medical condition  Airway Management Planned: LMA  Additional Equipment:   Intra-op Plan:   Post-operative Plan: Extubation in OR  Informed Consent: I have reviewed the patients History and Physical, chart, labs and discussed the procedure including the risks, benefits and alternatives for the proposed anesthesia with the patient or authorized representative who has indicated his/her understanding and acceptance.     Dental advisory given  Plan Discussed with: CRNA  Anesthesia Plan Comments:        Anesthesia Quick Evaluation

## 2018-12-17 NOTE — Anesthesia Postprocedure Evaluation (Signed)
Anesthesia Post Note  Patient: Jesse Barton.  Procedure(s) Performed: CYSTOSCOP RIGHT  /URETEROSCOPY/HOLMIUM LASER/STENT EXCHANGE (Right Ureter)     Patient location during evaluation: PACU Anesthesia Type: General Level of consciousness: awake and alert Pain management: pain level controlled Vital Signs Assessment: post-procedure vital signs reviewed and stable Respiratory status: spontaneous breathing, nonlabored ventilation, respiratory function stable and patient connected to nasal cannula oxygen Cardiovascular status: blood pressure returned to baseline and stable Postop Assessment: no apparent nausea or vomiting Anesthetic complications: no    Last Vitals:  Vitals:   12/17/18 1251 12/17/18 1350  BP:  (!) 168/84  Pulse: 74 70  Resp: 15 16  Temp: 37 C 36.7 C  SpO2: 99% 99%    Last Pain:  Vitals:   12/17/18 1350  TempSrc: Oral  PainSc:                  [name]

## 2018-12-17 NOTE — H&P (Signed)
H&P  Chief Complaint: Right renal stones, solitary right kidney  History of Present Illness: Jesse Barton presents for staged right ureteroscopy, laser lithotripsy, stone basket extraction and stent exchange.  He has a solitary right kidney and was noted to have a 15 mm right renal pelvic stone on CT scan in August.  He was taken for right ureteroscopy on November 19, 2018 but had a tight proximal ureter and also the stone was very dense.  He was brought back today for the above procedures.  He has been well without dysuria or gross hematuria.  No fever.  Minimal stent discomfort.  Past Medical History:  Diagnosis Date  . Cancer ([place])    Kidney  . History of kidney stones   . Hypertension   . Kidney stone   . Sciatic nerve pain    Past Surgical History:  Procedure Laterality Date  . CATARACT EXTRACTION    . CYSTOSCOPY/URETEROSCOPY/HOLMIUM LASER/STENT PLACEMENT Right 11/19/2018   Procedure: CYSTOSCOPY/RETROGRADE/URETEROSCOPY/HOLMIUM LASER/STENT PLACEMENT;  Surgeon: Festus Aloe, MD;  Location: [facility];  Service: Urology;  Laterality: Right;  . EYE SURGERY Bilateral    cataracts  . KIDNEY SURGERY    . NEPHRECTOMY Left     Home Medications:  Medications Prior to Admission  Medication Sig Dispense Refill Last Dose  . amLODipine (NORVASC) 5 MG tablet TAKE ONE TABLET BY MOUTH DAILY. (CONTACT DR FOR APPOINTMENT) (Patient taking differently: Take 5 mg by mouth daily. ) 30 tablet 3 12/16/2018 at 2200  . aspirin (ASPIRIN ADULT LOW DOSE) 81 MG EC tablet Take 1 tablet (81 mg total) by mouth daily. Swallow whole. 30 tablet 12 Past Week at Unknown time  . CALCIUM-MAGNESIUM-ZINC PO Take 2 tablets by mouth daily.   12/16/2018 at Unknown time  . Coenzyme Q10 (COQ10) 200 MG CAPS Take 200 mg by mouth daily.   Past Month at Unknown time  . ELDERBERRY PO Take 5 mLs by mouth every morning.   12/16/2018 at Unknown time  . Icosapent Ethyl 1 g CAPS Take 2 capsules (2 g total) by mouth 2 (two) times daily with a  meal. 360 capsule 1 Past Month at Unknown time  . Liniments (BLUE-EMU SUPER STRENGTH EX) Apply 1 application topically daily.   12/16/2018 at Unknown time  . simethicone (MYLICON) 0000000 MG chewable tablet Chew 125 mg by mouth every 6 (six) hours as needed for flatulence.   12/16/2018 at Unknown time  . tamsulosin (FLOMAX) 0.4 MG CAPS capsule Take 1 capsule (0.4 mg total) by mouth daily. 30 capsule 3 12/16/2018 at Unknown time   Allergies:  Allergies  Allergen Reactions  . Ciprofloxacin Other (See Comments)    Increased lower extremity weakness  . Tamiflu [Oseltamivir]     Extreme weakness and joint instability     Family History  Problem Relation Age of Onset  . Cancer Mother   . Leukemia Father   . Schizophrenia Sister   . Cancer Sister 35       kidney   Social History:  reports that he has been smoking cigars. He has never used smokeless tobacco. He reports current alcohol use. He reports that he does not use drugs.  ROS: A complete review of systems was performed.  All systems are negative except for pertinent findings as noted. Review of Systems  All other systems reviewed and are negative.    Physical Exam:  Vital signs in last 24 hours: Temp:  [97.7 F (36.5 C)] 97.7 F (36.5 C) (09/29 0910) Pulse Rate:  [  81] 81 (09/29 0910) Resp:  [18] 18 (09/29 0910) BP: (188)/(97) 188/97 (09/29 0910) SpO2:  [98 %] 98 % (09/29 0910) Weight:  [95.4 kg] 95.4 kg (09/29 0910) General:  Alert and oriented, No acute distress HEENT: Normocephalic, atraumatic Cardiovascular: Regular rate and rhythm Lungs: Regular rate and effort Abdomen: Soft, nontender, nondistended, no abdominal masses Back: No CVA tenderness Extremities: No edema Neurologic: Grossly intact  Laboratory Data:  No results found for this or any previous visit (from the past 24 hour(s)). Recent Results (from the past 240 hour(s))  Novel Coronavirus, NAA (Hosp order, Send-out to Ref Lab; TAT 18-24 hrs     Status: None    Collection Time: 12/13/18  1:15 PM   Specimen: Nasopharyngeal Swab; Respiratory  Result Value Ref Range Status   SARS-CoV-2, NAA NOT DETECTED NOT DETECTED Final    Comment: (NOTE) This nucleic acid amplification test was developed and its performance characteristics determined by [employer]. Nucleic acid amplification tests include PCR and TMA. This test has not been FDA cleared or approved. This test has been authorized by FDA under an Emergency Use Authorization (EUA). This test is only authorized for the duration of time the declaration that circumstances exist justifying the authorization of the emergency use of in vitro diagnostic tests for detection of SARS-CoV-2 virus and/or diagnosis of COVID-19 infection under section 564(b)(1) of the Act, 21 U.S.C. [id number]) (1), unless the authorization is terminated or revoked sooner. When diagnostic testing is negative, the possibility of a false negative result should be considered in the context of a patient's recent exposures and the presence of clinical signs and symptoms consistent with COVID-19. An individual without symptoms of COVID- 19 and who is not shedding SARS-CoV-2 vi rus would expect to have a negative (not detected) result in this assay. Performed At: [facility] [address] [place], [place] [id number] Rush Farmer MD [id number]    [place]  Final    Comment: Performed at [facility], [place] [address] [place], [place] [address]   Creatinine: No results for input(s): CREATININE in the last 168 hours.  Impression/Assessment:  Right renal stones -   Plan:  I discussed with the patient the nature, potential benefits, risks and alternatives to cystoscopy, right ureteroscopy, holmium laser lithotripsy, stone basket extraction and right stent exchange, including side effects of the proposed treatment, the likelihood of the patient achieving the goals of the  procedure, and any potential problems that might occur during the procedure or recuperation.  We discussed again he may need a staged procedure given he has a solitary kidney.  We also discussed alternatives such as shockwave lithotripsy but he tells me he failed shockwave lithotripsy in the past due to stone hardness.  We also discussed right PCNL.   All questions answered. Patient elects to proceed with right ureteroscopy   Festus Aloe 12/17/2018, 9:36 AM

## 2018-12-18 ENCOUNTER — Encounter (HOSPITAL_BASED_OUTPATIENT_CLINIC_OR_DEPARTMENT_OTHER): Payer: Self-pay | Admitting: Urology

## 2018-12-24 DIAGNOSIS — N200 Calculus of kidney: Secondary | ICD-10-CM | POA: Diagnosis not present

## 2019-01-27 ENCOUNTER — Other Ambulatory Visit: Payer: Self-pay | Admitting: Family Medicine

## 2019-01-27 DIAGNOSIS — I10 Essential (primary) hypertension: Secondary | ICD-10-CM

## 2019-01-27 DIAGNOSIS — R1031 Right lower quadrant pain: Secondary | ICD-10-CM

## 2019-01-27 DIAGNOSIS — R3129 Other microscopic hematuria: Secondary | ICD-10-CM

## 2019-01-27 DIAGNOSIS — Z87442 Personal history of urinary calculi: Secondary | ICD-10-CM

## 2019-02-03 ENCOUNTER — Ambulatory Visit (INDEPENDENT_AMBULATORY_CARE_PROVIDER_SITE_OTHER): Payer: Medicare Other | Admitting: Family Medicine

## 2019-02-03 ENCOUNTER — Other Ambulatory Visit: Payer: Self-pay

## 2019-02-03 ENCOUNTER — Encounter: Payer: Self-pay | Admitting: Family Medicine

## 2019-02-03 DIAGNOSIS — M5441 Lumbago with sciatica, right side: Secondary | ICD-10-CM

## 2019-02-03 DIAGNOSIS — R42 Dizziness and giddiness: Secondary | ICD-10-CM

## 2019-02-03 DIAGNOSIS — G8929 Other chronic pain: Secondary | ICD-10-CM

## 2019-02-03 MED ORDER — PREDNISONE 20 MG PO TABS: ORAL_TABLET | ORAL | 0 refills | Status: DC

## 2019-02-03 NOTE — Progress Notes (Signed)
Virtual Visit via telephone Note Due to COVID-19 pandemic this visit was conducted virtually. This visit type was conducted due to national recommendations for restrictions regarding the COVID-19 Pandemic (e.g. social distancing, sheltering in place) in an effort to limit this patient's exposure and mitigate transmission in our community. All issues noted in this document were discussed and addressed.  A physical exam was not performed with this format.   I connected with Jesse Barton. on 02/03/2019 at 1515 by telephone and verified that I am speaking with the correct person using two identifiers. Jesse Barton. is currently located at home and no one is currently with them during visit. The provider, Monia Pouch, FNP is located in their office at time of visit.  I discussed the limitations, risks, security and privacy concerns of performing an evaluation and management service by telephone and the availability of in person appointments. I also discussed with the patient that there may be a patient responsible charge related to this service. The patient expressed understanding and agreed to proceed.  Subjective:  Patient ID: Jesse Barton., male    DOB: 1940/08/02, 78 y.o.   MRN: [id number]  Chief Complaint:  Dizziness and Back Pain   HPI: Jesse Barton. is a 78 y.o. male presenting on 02/03/2019 for Dizziness and Back Pain   Pt reports intermittent dizziness since the initiation of Flomax for his renal stones. Pt has had the stone removed and had the ureteral stent removed. Pt states he has continued to take the Flomax. States he has dizziness with standing and with quick positional changes. He denies focal deficits, confusion, weakness, slurred speech, headaches, or syncope. No chest pain or palpitations.   He also reports worsening of his chronic low back pain with right sided sciatica. States he has been unable to go to the [facility] or therapy due to the pandemic and feels this has  caused the pain to be worse. He has not followed up with ortho in a while. No saddle anesthesia, loss of bowel or bladder function, weakness, or confusion. No weight loss or night sweats.   Dizziness This is a recurrent problem. The current episode started more than 1 month ago. The problem occurs intermittently. The problem has been waxing and waning. Associated symptoms include arthralgias, myalgias and vertigo. Pertinent negatives include no abdominal pain, anorexia, change in bowel habit, chest pain, chills, congestion, coughing, diaphoresis, fatigue, fever, headaches, joint swelling, nausea, neck pain, numbness, rash, sore throat, swollen glands, urinary symptoms, visual change, vomiting or weakness. The symptoms are aggravated by standing, twisting, walking and bending. He has tried nothing for the symptoms.  Back Pain This is a chronic problem. The current episode started more than 1 year ago. The problem occurs daily. The problem has been gradually worsening since onset. The pain is present in the lumbar spine, gluteal and sacro-iliac. The quality of the pain is described as burning, stabbing, shooting and aching. The pain radiates to the right thigh. The pain is at a severity of 5/10. The pain is moderate. The pain is worse during the day. The symptoms are aggravated by bending, position, standing, sitting, twisting and stress. Associated symptoms include leg pain. Pertinent negatives include no abdominal pain, bladder incontinence, bowel incontinence, chest pain, dysuria, fever, headaches, numbness, paresis, paresthesias, pelvic pain, perianal numbness, tingling, weakness or weight loss. He has tried analgesics for the symptoms. The treatment provided no relief.     Relevant past medical, surgical, family, and  social history reviewed and updated as indicated.  Allergies and medications reviewed and updated.   Past Medical History:  Diagnosis Date  . Cancer ([place])    Kidney  . History of  kidney stones   . Hypertension   . Kidney stone   . Sciatic nerve pain     Past Surgical History:  Procedure Laterality Date  . CATARACT EXTRACTION    . CYSTOSCOPY/URETEROSCOPY/HOLMIUM LASER/STENT PLACEMENT Right 11/19/2018   Procedure: CYSTOSCOPY/RETROGRADE/URETEROSCOPY/HOLMIUM LASER/STENT PLACEMENT;  Surgeon: Festus Aloe, MD;  Location: [facility];  Service: Urology;  Laterality: Right;  . CYSTOSCOPY/URETEROSCOPY/HOLMIUM LASER/STENT PLACEMENT Right 12/17/2018   Procedure: CYSTOSCOP RIGHT  /URETEROSCOPY/HOLMIUM LASER/STENT EXCHANGE;  Surgeon: Festus Aloe, MD;  Location: [facility];  Service: Urology;  Laterality: Right;  . EYE SURGERY Bilateral    cataracts  . KIDNEY SURGERY    . NEPHRECTOMY Left     Social History   Socioeconomic History  . Marital status: Married    Spouse name: Not on file  . Number of children: 2  . Years of education: 59  . Highest education level: Some college, no degree  Occupational History  . Not on file  Social Needs  . Financial resource strain: Not hard at all  . Food insecurity    Worry: Never true    Inability: Never true  . Transportation needs    Medical: No    Non-medical: No  Tobacco Use  . Smoking status: Light Tobacco Smoker    Types: Cigars  . Smokeless tobacco: Never Used  . Tobacco comment: smokes  for the last years   Substance and Sexual Activity  . Alcohol use: Yes    Comment: occasional  . Drug use: No  . Sexual activity: Not on file  Lifestyle  . Physical activity    Days per week: 0 days    Minutes per session: 0 min  . Stress: Not at all  Relationships  . Social connections    Talks on phone: More than three times a week    Gets together: More than three times a week    Attends religious service: 1 to 4 times per year    Active member of club or organization: No    Attends meetings of clubs or organizations: Never    Relationship status: Married  . Intimate partner violence    Fear of  current or ex partner: Not on file    Emotionally abused: Not on file    Physically abused: Not on file    Forced sexual activity: Not on file  Other Topics Concern  . Not on file  Social History Narrative  . Not on file    Outpatient Encounter Medications as of 02/03/2019  Medication Sig  . amLODipine (NORVASC) 5 MG tablet TAKE ONE TABLET BY MOUTH DAILY  . aspirin (ASPIRIN ADULT LOW DOSE) 81 MG EC tablet Take 1 tablet (81 mg total) by mouth daily. Swallow whole.  Marland Kitchen CALCIUM-MAGNESIUM-ZINC PO Take 2 tablets by mouth daily.  . Coenzyme Q10 (COQ10) 200 MG CAPS Take 200 mg by mouth daily.  Marland Kitchen ELDERBERRY PO Take 5 mLs by mouth every morning.  Vanessa Kick Ethyl 1 g CAPS Take 2 capsules (2 g total) by mouth 2 (two) times daily with a meal.  . Liniments (BLUE-EMU SUPER STRENGTH EX) Apply 1 application topically daily.  . predniSONE (DELTASONE) 20 MG tablet 2 po at sametime daily for 5 days  . simethicone (MYLICON) 0000000 MG chewable tablet Chew 125 mg  by mouth every 6 (six) hours as needed for flatulence.  . [DISCONTINUED] tamsulosin (FLOMAX) 0.4 MG CAPS capsule TAKE ONE CAPSULE BY MOUTH DAILY.   No facility-administered encounter medications on file as of 02/03/2019.     Allergies  Allergen Reactions  . Ciprofloxacin Other (See Comments)    Increased lower extremity weakness  . Tamiflu [Oseltamivir]     Extreme weakness and joint instability     Review of Systems  Constitutional: Negative for activity change, appetite change, chills, diaphoresis, fatigue, fever, unexpected weight change and weight loss.  HENT: Negative.  Negative for congestion and sore throat.   Eyes: Negative.   Respiratory: Negative for cough, chest tightness and shortness of breath.   Cardiovascular: Negative for chest pain, palpitations and leg swelling.  Gastrointestinal: Negative for abdominal pain, anorexia, blood in stool, bowel incontinence, change in bowel habit, constipation, diarrhea, nausea and vomiting.   Endocrine: Negative.   Genitourinary: Negative for bladder incontinence, decreased urine volume, difficulty urinating, discharge, dysuria, enuresis, flank pain, frequency, genital sores, hematuria, pelvic pain, penile pain, penile swelling, scrotal swelling, testicular pain and urgency.  Musculoskeletal: Positive for arthralgias, back pain and myalgias. Negative for gait problem, joint swelling, neck pain and neck stiffness.  Skin: Negative.  Negative for rash.  Allergic/Immunologic: Negative.   Neurological: Positive for dizziness, vertigo and light-headedness. Negative for tingling, tremors, seizures, syncope, facial asymmetry, speech difficulty, weakness, numbness, headaches and paresthesias.  Hematological: Negative.   Psychiatric/Behavioral: Negative for confusion, hallucinations, sleep disturbance and suicidal ideas.  All other systems reviewed and are negative.        Observations/Objective: No vital signs or physical exam, this was a telephone or virtual health encounter.  Pt alert and oriented, answers all questions appropriately, and able to speak in full sentences.    Assessment and Plan: Jesse Barton was seen today for dizziness and back pain.  Diagnoses and all orders for this visit:  Chronic midline low back pain with right-sided sciatica No red flags concerning for Cauda Equina Syndrome. Will give burst of steroids to see if beneficial. Pt encouraged to make follow up with orhto and PT. Pt will call to schedule. Medications as prescribed.  -     predniSONE (DELTASONE) 20 MG tablet; 2 po at sametime daily for 5 days  Dizziness Dizziness likely related to Flomax. Will stop at this time. Renal stones have been removed. Has follow up with urology soon. Will determine if he needs to be on this medication long term. Adequate hydration discussed. Slow positional changes. Has upcoming in office appointment, will reevaluate then. Report any new, worsening, or persistent symptoms. No red  flags present, will go to ED if red flags appear.     Follow Up Instructions: Return if symptoms worsen or fail to improve.    I discussed the assessment and treatment plan with the patient. The patient was provided an opportunity to ask questions and all were answered. The patient agreed with the plan and demonstrated an understanding of the instructions.   The patient was advised to call back or seek an in-person evaluation if the symptoms worsen or if the condition fails to improve as anticipated.  The above assessment and management plan was discussed with the patient. The patient verbalized understanding of and has agreed to the management plan. Patient is aware to call the clinic if they develop any new symptoms or if symptoms persist or worsen. Patient is aware when to return to the clinic for a follow-up visit. Patient educated on  when it is appropriate to go to the emergency department.    I provided 15 minutes of non-face-to-face time during this encounter. The call started at 1515. The call ended at 1530. The other time was used for coordination of care.    Monia Pouch, FNP-C [facility] [address] [place], [place] [address] [phone] 02/03/2019

## 2019-02-05 ENCOUNTER — Other Ambulatory Visit: Payer: Self-pay

## 2019-02-06 ENCOUNTER — Ambulatory Visit: Payer: Medicare Other | Admitting: Family Medicine

## 2019-02-06 ENCOUNTER — Encounter: Payer: Self-pay | Admitting: Family Medicine

## 2019-02-06 ENCOUNTER — Other Ambulatory Visit: Payer: Self-pay

## 2019-02-06 ENCOUNTER — Ambulatory Visit (INDEPENDENT_AMBULATORY_CARE_PROVIDER_SITE_OTHER): Payer: Medicare Other | Admitting: Family Medicine

## 2019-02-06 VITALS — BP 177/116 | HR 89 | Temp 97.5°F | Resp 20 | Ht 70.0 in | Wt 213.0 lb

## 2019-02-06 DIAGNOSIS — G8929 Other chronic pain: Secondary | ICD-10-CM | POA: Diagnosis not present

## 2019-02-06 DIAGNOSIS — M5441 Lumbago with sciatica, right side: Secondary | ICD-10-CM

## 2019-02-06 DIAGNOSIS — R42 Dizziness and giddiness: Secondary | ICD-10-CM | POA: Diagnosis not present

## 2019-02-06 DIAGNOSIS — E782 Mixed hyperlipidemia: Secondary | ICD-10-CM

## 2019-02-06 DIAGNOSIS — I10 Essential (primary) hypertension: Secondary | ICD-10-CM | POA: Diagnosis not present

## 2019-02-06 MED ORDER — KETOROLAC TROMETHAMINE 60 MG/2ML IM SOLN
60.0000 mg | Freq: Once | INTRAMUSCULAR | Status: AC
  Administered 2019-02-06: 12:00:00 60 mg via INTRAMUSCULAR

## 2019-02-06 MED ORDER — METHYLPREDNISOLONE ACETATE 40 MG/ML IJ SUSP
40.0000 mg | Freq: Once | INTRAMUSCULAR | Status: AC
  Administered 2019-02-06: 12:00:00 40 mg via INTRAMUSCULAR

## 2019-02-06 MED ORDER — AMLODIPINE BESYLATE 10 MG PO TABS: 10.0000 mg | ORAL_TABLET | Freq: Every day | ORAL | 3 refills | Status: DC

## 2019-02-06 NOTE — Patient Instructions (Addendum)
Chronic Back Pain When back pain lasts longer than 3 months, it is called chronic back pain. Pain may get worse at certain times (flare-ups). There are things you can do at home to manage your pain. Follow these instructions at home: Activity      Avoid bending and other activities that make pain worse.  When standing: ? Keep your upper back and neck straight. ? Keep your shoulders pulled back. ? Avoid slouching.  When sitting: ? Keep your back straight. ? Relax your shoulders. Do not round your shoulders or pull them backward.  Do not sit or stand in one place for long periods of time.  Take short rest breaks during the day. Lying down or standing is usually better than sitting. Resting can help relieve pain.  When sitting or lying down for a long time, do some mild activity or stretching. This will help to prevent stiffness and pain.  Get regular exercise. Ask your doctor what activities are safe for you.  Do not lift anything that is heavier than 10 lb (4.5 kg). To prevent injury when you lift things: ? Bend your knees. ? Keep the weight close to your body. ? Avoid twisting. Managing pain  If told, put ice on the painful area. Your doctor may tell you to use ice for 24-48 hours after a flare-up starts. ? Put ice in a plastic bag. ? Place a towel between your skin and the bag. ? Leave the ice on for 20 minutes, 2-3 times a day.  If told, put heat on the painful area as often as told by your doctor. Use the heat source that your doctor recommends, such as a moist heat pack or a heating pad. ? Place a towel between your skin and the heat source. ? Leave the heat on for 20-30 minutes. ? Remove the heat if your skin turns bright red. This is especially important if you are unable to feel pain, heat, or cold. You may have a greater risk of getting burned.  Soak in a warm bath. This can help relieve pain.  Take over-the-counter and prescription medicines only as told by your  doctor. General instructions  Sleep on a firm mattress. Try lying on your side with your knees slightly bent. If you lie on your back, put a pillow under your knees.  Keep all follow-up visits as told by your doctor. This is important. Contact a doctor if:  You have pain that does not get better with rest or medicine. Get help right away if:  One or both of your arms or legs feel weak.  One or both of your arms or legs lose feeling (numbness).  You have trouble controlling when you poop (bowel movement) or pee (urinate).  You feel sick to your stomach (nauseous).  You throw up (vomit).  You have belly (abdominal) pain.  You have shortness of breath.  You pass out (faint). Summary  When back pain lasts longer than 3 months, it is called chronic back pain.  Pain may get worse at certain times (flare-ups).  Use ice and heat as told by your doctor. Your doctor may tell you to use ice after flare-ups. This information is not intended to replace advice given to you by your health care provider. Make sure you discuss any questions you have with your health care provider. Document Released: 08/23/2007 Document Revised: 06/27/2018 Document Reviewed: 10/19/2016 Elsevier Patient Education  2020 [place] DASH stands for "  Dietary Approaches to Stop Hypertension." The DASH eating plan is a healthy eating plan that has been shown to reduce high blood pressure (hypertension). Additional health benefits may include reducing the risk of type 2 diabetes mellitus, heart disease, and stroke. The DASH eating plan may also help with weight loss.  WHAT DO I NEED TO KNOW ABOUT THE DASH EATING PLAN? For the DASH eating plan, you will follow these general guidelines:  Choose foods with a percent daily value for sodium of less than 5% (as listed on the food label).  Use salt-free seasonings or herbs instead of table salt or sea salt.  Check with your health care provider  or pharmacist before using salt substitutes.  Eat lower-sodium products, often labeled as "lower sodium" or "no salt added."  Eat fresh foods.  Eat more vegetables, fruits, and low-fat dairy products.  Choose whole grains. Look for the word "whole" as the first word in the ingredient list.  Choose fish and skinless chicken or [place] more often than red meat. Limit fish, poultry, and meat to 6 oz (170 g) each day.  Limit sweets, desserts, sugars, and sugary drinks.  Choose heart-healthy fats.  Limit cheese to 1 oz (28 g) per day.  Eat more home-cooked food and less restaurant, buffet, and fast food.  Limit fried foods.  Cook foods using methods other than frying.  Limit canned vegetables. If you do use them, rinse them well to decrease the sodium.  When eating at a restaurant, ask that your food be prepared with less salt, or no salt if possible.  WHAT FOODS CAN I EAT? Seek help from a dietitian for individual calorie needs.  Grains Whole grain or whole wheat bread. Brown rice. Whole grain or whole wheat pasta. Quinoa, bulgur, and whole grain cereals. Low-sodium cereals. Corn or whole wheat flour tortillas. Whole grain cornbread. Whole grain crackers. Low-sodium crackers.  Vegetables Fresh or frozen vegetables (raw, steamed, roasted, or grilled). Low-sodium or reduced-sodium tomato and vegetable juices. Low-sodium or reduced-sodium tomato sauce and paste. Low-sodium or reduced-sodium canned vegetables.   Fruits All fresh, canned (in natural juice), or frozen fruits.  Meat and Other Protein Products Ground beef (85% or leaner), grass-fed beef, or beef trimmed of fat. Skinless chicken or [place]. Ground chicken or [place]. Pork trimmed of fat. All fish and seafood. Eggs. Dried beans, peas, or lentils. Unsalted nuts and seeds. Unsalted canned beans.  Dairy Low-fat dairy products, such as skim or 1% milk, 2% or reduced-fat cheeses, low-fat ricotta or cottage cheese, or plain  low-fat yogurt. Low-sodium or reduced-sodium cheeses.  Fats and Oils Tub margarines without trans fats. Light or reduced-fat mayonnaise and salad dressings (reduced sodium). Avocado. Safflower, olive, or canola oils. Natural peanut or almond butter.  Other Unsalted popcorn and pretzels. The items listed above may not be a complete list of recommended foods or beverages. Contact your dietitian for more options.  WHAT FOODS ARE NOT RECOMMENDED?  Grains White bread. White pasta. White rice. Refined cornbread. Bagels and croissants. Crackers that contain trans fat.  Vegetables Creamed or fried vegetables. Vegetables in a cheese sauce. Regular canned vegetables. Regular canned tomato sauce and paste. Regular tomato and vegetable juices.  Fruits Dried fruits. Canned fruit in light or heavy syrup. Fruit juice.  Meat and Other Protein Products Fatty cuts of meat. Ribs, chicken wings, bacon, sausage, bologna, salami, chitterlings, fatback, hot dogs, bratwurst, and packaged luncheon meats. Salted nuts and seeds. Canned beans with salt.  Dairy Whole or 2% milk,  cream, half-and-half, and cream cheese. Whole-fat or sweetened yogurt. Full-fat cheeses or blue cheese. Nondairy creamers and whipped toppings. Processed cheese, cheese spreads, or cheese curds.  Condiments Onion and garlic salt, seasoned salt, table salt, and sea salt. Canned and packaged gravies. Worcestershire sauce. Tartar sauce. Barbecue sauce. Teriyaki sauce. Soy sauce, including reduced sodium. Steak sauce. Fish sauce. Oyster sauce. Cocktail sauce. Horseradish. Ketchup and mustard. Meat flavorings and tenderizers. Bouillon cubes. Hot sauce. Tabasco sauce. Marinades. Taco seasonings. Relishes.  Fats and Oils Butter, stick margarine, lard, shortening, ghee, and bacon fat. Coconut, palm kernel, or palm oils. Regular salad dressings.  Other Pickles and olives. Salted popcorn and pretzels.  The items listed above may not be a  complete list of foods and beverages to avoid. Contact your dietitian for more information.  WHERE CAN I FIND MORE INFORMATION? National Heart, [employer], and [employer] Institute: travelstabloid.com Document Released: 02/23/2011 Document Revised: 07/21/2013 Document Reviewed: 01/08/2013 [facility] Patient Information 2015 [place], [place]. This information is not intended to replace advice given to you by your health care provider. Make sure you discuss any questions you have with your health care provider.   I think that you would greatly benefit from seeing a nutritionist.  If you are interested, please call Dr Jenne Campus at [phone] to schedule an appointment.

## 2019-02-06 NOTE — Patient Outreach (Signed)
[facility]) Care Management  02/06/2019  Syris [place]. Apr 05, 1940 [id number]   Medication Adherence call to Mr. Rulon Abide [facility] spoke with patient she is past due on Atorvastatin 40 mg,patient explain he has stop the medication he was having side effects (leg cramps),patient said he can not tolerate any generic,patient prefers to have the brand name but under insurance its to expensive,patient explain he has already gone over with her doctor. Mr. Overbaugh is showing past due under [place].   [facility] Direct Dial [phone]  Fax [phone] [email or web address]

## 2019-02-07 LAB — LIPID PANEL
Chol/HDL Ratio: 3.2 ratio (ref 0.0–5.0)
Cholesterol, Total: 191 mg/dL (ref 100–199)
HDL: 60 mg/dL (ref >39)
LDL Chol Calc (NIH): 95 mg/dL (ref 0–99)
Triglycerides: 211 mg/dL — ABNORMAL HIGH (ref 0–149)
VLDL Cholesterol Cal: 36 mg/dL (ref 5–40)

## 2019-02-07 LAB — CMP14+EGFR
ALT: 14 IU/L (ref 0–44)
AST: 19 IU/L (ref 0–40)
Albumin/Globulin Ratio: 1.8 (ref 1.2–2.2)
Albumin: 4.7 g/dL (ref 3.7–4.7)
Alkaline Phosphatase: 69 IU/L (ref 39–117)
BUN/Creatinine Ratio: 14 (ref 10–24)
BUN: 16 mg/dL (ref 8–27)
Bilirubin Total: 0.3 mg/dL (ref 0.0–1.2)
CO2: 25 mmol/L (ref 20–29)
Calcium: 10.2 mg/dL (ref 8.6–10.2)
Chloride: 103 mmol/L (ref 96–106)
Creatinine, Ser: 1.15 mg/dL (ref 0.76–1.27)
GFR calc Af Amer: 71 mL/min/{1.73_m2} (ref >59)
GFR calc non Af Amer: 61 mL/min/{1.73_m2} (ref >59)
Globulin, Total: 2.6 g/dL (ref 1.5–4.5)
Glucose: 109 mg/dL — ABNORMAL HIGH (ref 65–99)
Potassium: 4.3 mmol/L (ref 3.5–5.2)
Sodium: 145 mmol/L — ABNORMAL HIGH (ref 134–144)
Total Protein: 7.3 g/dL (ref 6.0–8.5)

## 2019-02-07 LAB — CBC WITH DIFFERENTIAL/PLATELET
Basophils Absolute: 0.1 10*3/uL (ref 0.0–0.2)
Basos: 1 %
EOS (ABSOLUTE): 0.1 10*3/uL (ref 0.0–0.4)
Eos: 1 %
Hematocrit: 47.1 % (ref 37.5–51.0)
Hemoglobin: 15.9 g/dL (ref 13.0–17.7)
Immature Grans (Abs): 0 10*3/uL (ref 0.0–0.1)
Immature Granulocytes: 0 %
Lymphocytes Absolute: 2.9 10*3/uL (ref 0.7–3.1)
Lymphs: 27 %
MCH: 32.9 pg (ref 26.6–33.0)
MCHC: 33.8 g/dL (ref 31.5–35.7)
MCV: 97 fL (ref 79–97)
Monocytes Absolute: 1 10*3/uL — ABNORMAL HIGH (ref 0.1–0.9)
Monocytes: 9 %
Neutrophils Absolute: 6.9 10*3/uL (ref 1.4–7.0)
Neutrophils: 62 %
Platelets: 294 10*3/uL (ref 150–450)
RBC: 4.84 x10E6/uL (ref 4.14–5.80)
RDW: 13.1 % (ref 11.6–15.4)
WBC: 10.9 10*3/uL — ABNORMAL HIGH (ref 3.4–10.8)

## 2019-02-08 DIAGNOSIS — R42 Dizziness and giddiness: Secondary | ICD-10-CM | POA: Insufficient documentation

## 2019-02-08 DIAGNOSIS — Z91148 Patient's other noncompliance with medication regimen for other reason: Secondary | ICD-10-CM | POA: Insufficient documentation

## 2019-02-08 DIAGNOSIS — Z9114 Patient's other noncompliance with medication regimen: Secondary | ICD-10-CM | POA: Insufficient documentation

## 2019-02-08 MED ORDER — ICOSAPENT ETHYL 1 G PO CAPS: 2.0000 g | ORAL_CAPSULE | Freq: Two times a day (BID) | ORAL | 1 refills | Status: DC

## 2019-02-08 NOTE — Progress Notes (Signed)
Subjective:  Patient ID: Jesse Mane., male    DOB: 05-07-1940, 78 y.o.   MRN: [id number]  Patient Care Team: Baruch Gouty, FNP as PCP - General (Family Medicine)   Chief Complaint:  Medical Management of Chronic Issues (6 mo ), Hypertension, Hyperlipidemia, and Back Pain (follow up )   HPI: Jesse Hoyt. is a 78 y.o. male presenting on 02/06/2019 for Medical Management of Chronic Issues (6 mo ), Hypertension, Hyperlipidemia, and Back Pain (follow up )   1. Essential hypertension Does not check blood pressure at home. Does not adhere to diet or exercise. Is taking Norvasc 5 mg PO daily and tolerating well. No ankle edema. No chest pain, headaches, confusion, or weakness. No dizziness or palpitations.   2. Chronic midline low back pain with right-sided sciatica Ongoing and worsening back pain. Pain is located in mid lower back and radiates down right leg. Has been present for several months. Any activity worsens the pain. Has to use cane to help with ambulation due to severity of pain. Can be 8/10 at times. No saddle anesthesia, focal deficits, loss of bowel or bladder function, weight loss, fever, or night sweats. Would like to see surgery pertaining to ongoing back pain, declined PT. States he has done this in the past without relief of symptoms. He was recently prescribed a burst of steroids with some relief of the pain.   3. Mixed hyperlipidemia Unable to tolerate statin therapy. States he has severe muscle cramps in his legs when he takes it. He does not watch diet and does not exercise. Has not been taking Vascepa.   4. Dizziness Pt reports ongoing dizziness. States this happens several times per day. Is worse with standing and bending. Has stopped Flomax and noticed some decrease in symptoms but states he still has intermittent dizziness. No focal deficits, confusion, syncope, or palpitations. No visual changes.      Relevant past medical, surgical, family, and social  history reviewed and updated as indicated.  Allergies and medications reviewed and updated. Date reviewed: Chart in Epic.   Past Medical History:  Diagnosis Date  . Cancer ([place])    Kidney  . History of kidney stones   . Hypertension   . Kidney stone   . Sciatic nerve pain     Past Surgical History:  Procedure Laterality Date  . CATARACT EXTRACTION    . CYSTOSCOPY/URETEROSCOPY/HOLMIUM LASER/STENT PLACEMENT Right 11/19/2018   Procedure: CYSTOSCOPY/RETROGRADE/URETEROSCOPY/HOLMIUM LASER/STENT PLACEMENT;  Surgeon: Festus Aloe, MD;  Location: [facility];  Service: Urology;  Laterality: Right;  . CYSTOSCOPY/URETEROSCOPY/HOLMIUM LASER/STENT PLACEMENT Right 12/17/2018   Procedure: CYSTOSCOP RIGHT  /URETEROSCOPY/HOLMIUM LASER/STENT EXCHANGE;  Surgeon: Festus Aloe, MD;  Location: [facility];  Service: Urology;  Laterality: Right;  . EYE SURGERY Bilateral    cataracts  . KIDNEY SURGERY    . NEPHRECTOMY Left     Social History   Socioeconomic History  . Marital status: Married    Spouse name: Not on file  . Number of children: 2  . Years of education: 26  . Highest education level: Some college, no degree  Occupational History  . Not on file  Social Needs  . Financial resource strain: Not hard at all  . Food insecurity    Worry: Never true    Inability: Never true  . Transportation needs    Medical: No    Non-medical: No  Tobacco Use  . Smoking status: Light Tobacco Smoker  Types: Cigars  . Smokeless tobacco: Never Used  . Tobacco comment: smokes  for the last years   Substance and Sexual Activity  . Alcohol use: Yes    Comment: occasional  . Drug use: No  . Sexual activity: Not on file  Lifestyle  . Physical activity    Days per week: 0 days    Minutes per session: 0 min  . Stress: Not at all  Relationships  . Social connections    Talks on phone: More than three times a week    Gets together: More than three times a week    Attends religious  service: 1 to 4 times per year    Active member of club or organization: No    Attends meetings of clubs or organizations: Never    Relationship status: Married  . Intimate partner violence    Fear of current or ex partner: Not on file    Emotionally abused: Not on file    Physically abused: Not on file    Forced sexual activity: Not on file  Other Topics Concern  . Not on file  Social History Narrative  . Not on file    Outpatient Encounter Medications as of 02/06/2019  Medication Sig  . CALCIUM-MAGNESIUM-ZINC PO Take 2 tablets by mouth daily.  Marland Kitchen ELDERBERRY PO Take 5 mLs by mouth every morning.  Vanessa Kick Ethyl 1 g CAPS Take 2 capsules (2 g total) by mouth 2 (two) times daily with a meal.  . Liniments (BLUE-EMU SUPER STRENGTH EX) Apply 1 application topically daily.  . simethicone (MYLICON) 169 MG chewable tablet Chew 125 mg by mouth every 6 (six) hours as needed for flatulence.  . [DISCONTINUED] amLODipine (NORVASC) 5 MG tablet TAKE ONE TABLET BY MOUTH DAILY  . [DISCONTINUED] predniSONE (DELTASONE) 20 MG tablet 2 po at sametime daily for 5 days  . amLODipine (NORVASC) 10 MG tablet Take 1 tablet (10 mg total) by mouth daily.  Marland Kitchen aspirin (ASPIRIN ADULT LOW DOSE) 81 MG EC tablet Take 1 tablet (81 mg total) by mouth daily. Swallow whole. (Patient not taking: Reported on 02/06/2019)  . [DISCONTINUED] Coenzyme Q10 (COQ10) 200 MG CAPS Take 200 mg by mouth daily.  . [EXPIRED] ketorolac (TORADOL) injection 60 mg   . [EXPIRED] methylPREDNISolone acetate (DEPO-MEDROL) injection 40 mg    No facility-administered encounter medications on file as of 02/06/2019.     Allergies  Allergen Reactions  . Ciprofloxacin Other (See Comments)    Increased lower extremity weakness  . Tamiflu [Oseltamivir]     Extreme weakness and joint instability     Review of Systems  Constitutional: Negative for activity change, appetite change, chills, diaphoresis, fatigue, fever and unexpected weight change.   HENT: Negative.   Eyes: Negative.  Negative for photophobia and visual disturbance.  Respiratory: Negative for cough, chest tightness and shortness of breath.   Cardiovascular: Negative for chest pain, palpitations and leg swelling.  Gastrointestinal: Negative for abdominal pain, blood in stool, constipation, diarrhea, nausea and vomiting.  Endocrine: Negative.  Negative for cold intolerance, heat intolerance, polydipsia, polyphagia and polyuria.  Genitourinary: Negative for decreased urine volume, difficulty urinating, dysuria, flank pain, frequency, hematuria and urgency.  Musculoskeletal: Positive for arthralgias, back pain, gait problem and myalgias. Negative for joint swelling, neck pain and neck stiffness.  Skin: Negative.   Allergic/Immunologic: Negative.   Neurological: Positive for dizziness. Negative for tremors, seizures, syncope, facial asymmetry, speech difficulty, weakness, light-headedness, numbness and headaches.  Hematological: Negative.   Psychiatric/Behavioral:  Negative for confusion, hallucinations, sleep disturbance and suicidal ideas.  All other systems reviewed and are negative.       Objective:  BP (!) 177/116   Pulse 89   Temp (!) 97.5 F (36.4 C)   Resp 20   Ht '5\' 10"'$  (1.778 m)   Wt 213 lb (96.6 kg)   SpO2 97%   BMI 30.56 kg/m    Wt Readings from Last 3 Encounters:  02/06/19 213 lb (96.6 kg)  12/17/18 210 lb 6.4 oz (95.4 kg)  11/19/18 214 lb 11.7 oz (97.4 kg)    Physical Exam Vitals signs and nursing note reviewed.  Constitutional:      General: He is not in acute distress.    Appearance: Normal appearance. He is well-developed and well-groomed. He is obese. He is not ill-appearing, toxic-appearing or diaphoretic.  HENT:     Head: Normocephalic and atraumatic.     Jaw: There is normal jaw occlusion.     Right Ear: Hearing normal.     Left Ear: Hearing normal.     Nose: Nose normal.     Mouth/Throat:     Lips: Pink.     Mouth: Mucous  membranes are moist.     Pharynx: Oropharynx is clear. Uvula midline.  Eyes:     General: Lids are normal. No visual field deficit.    Extraocular Movements: Extraocular movements intact.     Conjunctiva/sclera: Conjunctivae normal.     Pupils: Pupils are equal, round, and reactive to light.  Neck:     Musculoskeletal: Normal range of motion and neck supple.     Thyroid: No thyroid mass, thyromegaly or thyroid tenderness.     Vascular: No carotid bruit or JVD.     Trachea: Trachea and phonation normal.  Cardiovascular:     Rate and Rhythm: Normal rate and regular rhythm.     Chest Wall: PMI is not displaced.     Pulses: Normal pulses.     Heart sounds: Normal heart sounds. No murmur. No friction rub. No gallop.   Pulmonary:     Effort: Pulmonary effort is normal. No respiratory distress.     Breath sounds: Normal breath sounds. No wheezing.  Abdominal:     General: Bowel sounds are normal. There is no distension or abdominal bruit.     Palpations: Abdomen is soft. There is no hepatomegaly or splenomegaly.     Tenderness: There is no abdominal tenderness. There is no right CVA tenderness or left CVA tenderness.     Hernia: No hernia is present.  Musculoskeletal:     Right hip: He exhibits decreased range of motion (positive SLR test at 35 degrees) and tenderness. He exhibits normal strength, no bony tenderness, no swelling, no crepitus, no deformity and no laceration.     Left hip: Normal.     Right knee: Normal.     Cervical back: Normal.     Thoracic back: Normal.     Lumbar back: He exhibits decreased range of motion (positive right SLR test at 35 degrees), tenderness and pain. He exhibits no bony tenderness, no swelling, no edema, no deformity, no laceration, no spasm and normal pulse.       Back:     Right lower leg: No edema.     Left lower leg: No edema.  Lymphadenopathy:     Cervical: No cervical adenopathy.  Skin:    General: Skin is warm and dry.     Capillary Refill:  Capillary refill  takes less than 2 seconds.     Coloration: Skin is not cyanotic, jaundiced or pale.     Findings: No rash.  Neurological:     General: No focal deficit present.     Mental Status: He is alert and oriented to person, place, and time.     GCS: GCS eye subscore is 4. GCS verbal subscore is 5. GCS motor subscore is 6.     Cranial Nerves: Cranial nerves are intact. No cranial nerve deficit, dysarthria or facial asymmetry.     Sensory: Sensation is intact. No sensory deficit.     Motor: Motor function is intact. No weakness.     Coordination: Coordination is intact. Romberg sign negative. Coordination normal. Finger-Nose-Finger Test normal. Rapid alternating movements normal.     Gait: Gait abnormal (antalgic, uses cane, slow).     Deep Tendon Reflexes: Reflexes are normal and symmetric. Reflexes normal.  Psychiatric:        Attention and Perception: Attention and perception normal.        Mood and Affect: Mood and affect normal.        Speech: Speech normal.        Behavior: Behavior normal. Behavior is cooperative.        Thought Content: Thought content normal.        Cognition and Memory: Cognition and memory normal.        Judgment: Judgment normal.     Results for orders placed or performed in visit on 02/06/19  CMP14+EGFR  Result Value Ref Range   Glucose 109 (H) 65 - 99 mg/dL   BUN 16 8 - 27 mg/dL   Creatinine, Ser 1.15 0.76 - 1.27 mg/dL   GFR calc non Af Amer 61 >59 mL/min/1.73   GFR calc Af Amer 71 >59 mL/min/1.73   BUN/Creatinine Ratio 14 10 - 24   Sodium 145 (H) 134 - 144 mmol/L   Potassium 4.3 3.5 - 5.2 mmol/L   Chloride 103 96 - 106 mmol/L   CO2 25 20 - 29 mmol/L   Calcium 10.2 8.6 - 10.2 mg/dL   Total Protein 7.3 6.0 - 8.5 g/dL   Albumin 4.7 3.7 - 4.7 g/dL   Globulin, Total 2.6 1.5 - 4.5 g/dL   Albumin/Globulin Ratio 1.8 1.2 - 2.2   Bilirubin Total 0.3 0.0 - 1.2 mg/dL   Alkaline Phosphatase 69 39 - 117 IU/L   AST 19 0 - 40 IU/L   ALT 14 0 - 44  IU/L  CBC with Differential/Platelet  Result Value Ref Range   WBC 10.9 (H) 3.4 - 10.8 x10E3/uL   RBC 4.84 4.14 - 5.80 x10E6/uL   Hemoglobin 15.9 13.0 - 17.7 g/dL   Hematocrit 47.1 37.5 - 51.0 %   MCV 97 79 - 97 fL   MCH 32.9 26.6 - 33.0 pg   MCHC 33.8 31.5 - 35.7 g/dL   RDW 13.1 11.6 - 15.4 %   Platelets 294 150 - 450 x10E3/uL   Neutrophils 62 Not Estab. %   Lymphs 27 Not Estab. %   Monocytes 9 Not Estab. %   Eos 1 Not Estab. %   Basos 1 Not Estab. %   Neutrophils Absolute 6.9 1.4 - 7.0 x10E3/uL   Lymphocytes Absolute 2.9 0.7 - 3.1 x10E3/uL   Monocytes Absolute 1.0 (H) 0.1 - 0.9 x10E3/uL   EOS (ABSOLUTE) 0.1 0.0 - 0.4 x10E3/uL   Basophils Absolute 0.1 0.0 - 0.2 x10E3/uL   Immature Granulocytes 0 Not Estab. %  Immature Grans (Abs) 0.0 0.0 - 0.1 x10E3/uL  Lipid panel  Result Value Ref Range   Cholesterol, Total 191 100 - 199 mg/dL   Triglycerides 211 (H) 0 - 149 mg/dL   HDL 60 >39 mg/dL   VLDL Cholesterol Cal 36 5 - 40 mg/dL   LDL Chol Calc (NIH) 95 0 - 99 mg/dL   Chol/HDL Ratio 3.2 0.0 - 5.0 ratio       Pertinent labs & imaging results that were available during my care of the patient were reviewed by me and considered in my medical decision making.  Assessment & Plan:  Jesse Barton was seen today for medical management of chronic issues, hypertension, hyperlipidemia and back pain.  Diagnoses and all orders for this visit:  Essential hypertension BP poorly controlled. Changes were made in regimen today, increased Norvasc to 10 mg daily. Goal BP is 130/80. Pt aware to report any persistent high or low readings. DASH diet and exercise encouraged. Exercise at least 150 minutes per week and increase as tolerated. Goal BMI > 25. Stress management encouraged. Avoid nicotine and tobacco product use. Avoid excessive alcohol and NSAID's. Avoid more than 2000 mg of sodium daily. Medications as prescribed. Follow up as scheduled.  -     CMP14+EGFR -     CBC with Differential/Platelet -      amLODipine (NORVASC) 10 MG tablet; Take 1 tablet (10 mg total) by mouth daily.  Chronic midline low back pain with right-sided sciatica Pain 7/10 today with positive SLR test on right. No red flags concerning for Cauda Equina syndrome. Unable to do oral NSAID therapy due to single kidney and HTN. Will give IM steroid burst and toradol injection today and refer to neurosurgery. Declined PT at this time.  -     Ambulatory referral to Neurosurgery -     methylPREDNISolone acetate (DEPO-MEDROL) injection 40 mg -     ketorolac (TORADOL) injection 60 mg  Mixed hyperlipidemia Diet encouraged - increase intake of fresh fruits and vegetables, increase intake of lean proteins. Bake, broil, or grill foods. Avoid fried, greasy, and fatty foods. Avoid fast foods. Increase intake of fiber-rich whole grains. Exercise encouraged - at least 150 minutes per week and advance as tolerated.  Goal BMI < 25. Unable to take statins due to intolerance. Will continue Vascepa. Follow up in 3-6 months as discussed.  -     Lipid panel  Dizziness Ongoing intermittent dizziness. No red flags concerning for acute neurovascular event. Will place referral to neurology. Pt still holding Flomax to see if symptoms resolve.  -     Ambulatory referral to Neurology     Continue all other maintenance medications.  Follow up plan: Return in about 3 months (around 05/09/2019), or if symptoms worsen or fail to improve, for BP.  Continue healthy lifestyle choices, including diet (rich in fruits, vegetables, and lean proteins, and low in salt and simple carbohydrates) and exercise (at least 30 minutes of moderate physical activity daily).  Educational handout given for chronic back pain  The above assessment and management plan was discussed with the patient. The patient verbalized understanding of and has agreed to the management plan. Patient is aware to call the clinic if they develop any new symptoms or if symptoms persist or  worsen. Patient is aware when to return to the clinic for a follow-up visit. Patient educated on when it is appropriate to go to the emergency department.   Monia Pouch, FNP-C [facility] [phone]

## 2019-02-10 ENCOUNTER — Telehealth: Payer: Self-pay | Admitting: Family Medicine

## 2019-02-10 NOTE — Telephone Encounter (Signed)
Patient aware and verbalizes understanding. 

## 2019-02-10 NOTE — Telephone Encounter (Signed)
He was given and injection and oral therapy. He needs to see ortho as discussed.

## 2019-02-17 ENCOUNTER — Encounter: Payer: Self-pay | Admitting: Neurology

## 2019-02-17 ENCOUNTER — Other Ambulatory Visit: Payer: Self-pay

## 2019-02-17 ENCOUNTER — Ambulatory Visit: Payer: Medicare Other | Admitting: Neurology

## 2019-02-17 VITALS — BP 175/92 | HR 88 | Temp 97.8°F | Ht 70.0 in | Wt 215.5 lb

## 2019-02-17 DIAGNOSIS — M5441 Lumbago with sciatica, right side: Secondary | ICD-10-CM

## 2019-02-17 DIAGNOSIS — Z87442 Personal history of urinary calculi: Secondary | ICD-10-CM

## 2019-02-17 DIAGNOSIS — G5731 Lesion of lateral popliteal nerve, right lower limb: Secondary | ICD-10-CM

## 2019-02-17 DIAGNOSIS — R42 Dizziness and giddiness: Secondary | ICD-10-CM

## 2019-02-17 DIAGNOSIS — M48061 Spinal stenosis, lumbar region without neurogenic claudication: Secondary | ICD-10-CM | POA: Insufficient documentation

## 2019-02-17 DIAGNOSIS — G8929 Other chronic pain: Secondary | ICD-10-CM

## 2019-02-17 MED ORDER — GABAPENTIN 100 MG PO CAPS: ORAL_CAPSULE | ORAL | 3 refills | Status: DC

## 2019-02-17 MED ORDER — METHYLPREDNISOLONE 4 MG PO TABS: ORAL_TABLET | ORAL | 0 refills | Status: DC

## 2019-02-17 NOTE — Progress Notes (Addendum)
[facility]  PATIENT: Jesse Barton. DOB: 28-Feb-1941  REFERRING DOCTOR OR PCP: Darla Lesches, FNP SOURCE: Patient, notes from primary care, imaging and lab reports, MRI images of the lumbar spine personally reviewed.  _________________________________   HISTORICAL  CHIEF COMPLAINT:  Chief Complaint  Patient presents with  . New Patient (Initial Visit)    RM 12 with wife (temp: 97.5). Internal referral for dizziness, leg weakness. Ambulates with cane. PCP iIncreased BP med last Thursday and dizziness improved. Having difficulty raising right foot. Last year, he had same issue with left leg. Having sciatic pain on right side.  Had spine inj.  last year at [facility] imaging which helped left sided weakness/pain.     HISTORY OF PRESENT ILLNESS:  I had the pleasure of seeing your patient, Jesse Barton, at [facility] for a neurologic consultation regarding his leg weakness, sciatic pain and dizziness.  He is a 78 year old man who has had a right foot drop that started 2 months ago.   It worsened quite a bit 2 weeks ago and he felt he needed to use a cane.  This morning seems slightly better than last week but not near baseline.  Around the time the symptoms started, he also noted pain at the fibular head and right above that region on the right.    Of note, the pain near the fibula head started after a fall in mid October.   He continues to have pain in the fibular head area of the right leg.    A steroid pack helped some with the pain.   He has pain (aching) and numbness going from the right buttock down the outer leg to the top of the right foot/ankle that started about 2 months ago.Marland Kitchen       He has had more issues with right leg pain over the last few months, possibly exacerbated with kidney stone treatments.   He notes LBP intermittently in any position, not necessarily worse with walking.   He notes he stoops more when he walks.   He does not have neurogenic  claudication symptoms.   However, he had pain more to the left in 2019.  He has had 2 epidural steroid injections but that pain. The first ESI was November 2019 and he felt pain completely resolved.   A second ESi was performed for left leg pain in January 2020 but had less benefit.     He feels the left sided pain gradually improved.     ESI's were performed by Dr Maryjean Ka after he saw Dr. Christella Noa for the spinal stenosis.   He reportedly said they would consider surgery if the pain worsened.  He has had PT for traction with some benefit before Covid-19/.   He has taken CBD oil with mild benefit for the back pain.      I personally reviewed the MRI of the lumbar spine performed 11/01/2017: At L2-L3, he has mild spinal stenosis and mild foraminal narrowing but no nerve root compression.  At L3-L4, he has moderately severe spinal stenosis with moderate right greater than left foraminal narrowing, moderately severe right and moderate left lateral recess stenosis.  There is possible right L4 nerve root compression.  At L4-L5, he has moderate spinal stenosis with moderately severe right and moderate left foraminal narrowing, moderately severe right greater than left lateral recess stenosis.  There is possible L5 nerve root compression to either side.  Degenerative changes cause moderate foraminal narrowing  bilaterally at L5-S1 but there does not appear to be nerve root compression.  He also has issues with dizziness x 1 month.  He reported that this was a lightheaded sensation without vertigo.    Since his amlodipine was increased from 5 to 10 mg, he feels the dizziness improved.     After taking Tamiflu 2 years ago, he felt very weak and received IV fluids twice.     REVIEW OF SYSTEMS: Constitutional: No fevers, chills, sweats, or change in appetite.  He has some fatigue. Eyes: No visual changes, double vision, eye pain Ear, nose and throat: No hearing loss, ear pain, nasal congestion, sore throat  Cardiovascular: No chest pain, palpitations Respiratory: No shortness of breath at rest or with exertion.   No wheezes GastrointestinaI: No nausea, vomiting, diarrhea, abdominal pain, fecal incontinence Genitourinary: No dysuria, urinary retention or frequency.  No nocturia. Musculoskeletal: No neck pain, back pain Integumentary: No rash, pruritus, skin lesions Neurological: as above Psychiatric: No depression at this time.  No anxiety Endocrine: No palpitations, diaphoresis, change in appetite, change in weigh or increased thirst Hematologic/Lymphatic: No anemia, purpura, petechiae. Allergic/Immunologic: No itchy/runny eyes, nasal congestion, recent allergic reactions, rashes  ALLERGIES: Allergies  Allergen Reactions  . Ciprofloxacin Other (See Comments)    Increased lower extremity weakness  . Lipitor [Atorvastatin Calcium]     Generic lipitor caused joint pain  . Tamiflu [Oseltamivir]     Extreme weakness and joint instability     HOME MEDICATIONS:  Current Outpatient Medications:  .  amLODipine (NORVASC) 10 MG tablet, Take 1 tablet (10 mg total) by mouth daily., Disp: 90 tablet, Rfl: 3 .  aspirin (ASPIRIN ADULT LOW DOSE) 81 MG EC tablet, Take 1 tablet (81 mg total) by mouth daily. Swallow whole., Disp: 30 tablet, Rfl: 12 .  CALCIUM-MAGNESIUM-ZINC PO, Take 2 tablets by mouth daily., Disp: , Rfl:  .  ELDERBERRY PO, Take 5 mLs by mouth every morning., Disp: , Rfl:  .  Liniments (BLUE-EMU SUPER STRENGTH EX), Apply 1 application topically daily., Disp: , Rfl:  .  simethicone (MYLICON) 0000000 MG chewable tablet, Chew 125 mg by mouth every 6 (six) hours as needed for flatulence., Disp: , Rfl:  .  UNABLE TO FIND, Take 1 Dose by mouth 2 (two) times daily. Med Name: CBD oil 10-15 drops in the morning and 10 drops at night, Disp: , Rfl:   PAST MEDICAL HISTORY: Past Medical History:  Diagnosis Date  . Cancer ([place])    Kidney  . History of kidney stones   . Hypertension   . Kidney  stone   . Sciatic nerve pain     PAST SURGICAL HISTORY: Past Surgical History:  Procedure Laterality Date  . CATARACT EXTRACTION    . CYSTOSCOPY/URETEROSCOPY/HOLMIUM LASER/STENT PLACEMENT Right 11/19/2018   Procedure: CYSTOSCOPY/RETROGRADE/URETEROSCOPY/HOLMIUM LASER/STENT PLACEMENT;  Surgeon: Festus Aloe, MD;  Location: [facility];  Service: Urology;  Laterality: Right;  . CYSTOSCOPY/URETEROSCOPY/HOLMIUM LASER/STENT PLACEMENT Right 12/17/2018   Procedure: CYSTOSCOP RIGHT  /URETEROSCOPY/HOLMIUM LASER/STENT EXCHANGE;  Surgeon: Festus Aloe, MD;  Location: [facility];  Service: Urology;  Laterality: Right;  . EYE SURGERY Bilateral    cataracts  . KIDNEY SURGERY    . NEPHRECTOMY Left     FAMILY HISTORY: Family History  Problem Relation Age of Onset  . Cancer Mother   . Leukemia Father   . Schizophrenia Sister   . Cancer Sister 59       kidney    SOCIAL HISTORY:  Social History  Socioeconomic History  . Marital status: Married    Spouse name: Not on file  . Number of children: 2  . Years of education: 12  . Highest education level: Some college, no degree  Occupational History  . Not on file  Social Needs  . Financial resource strain: Not hard at all  . Food insecurity    Worry: Never true    Inability: Never true  . Transportation needs    Medical: No    Non-medical: No  Tobacco Use  . Smoking status: Light Tobacco Smoker    Types: Cigars  . Smokeless tobacco: Never Used  . Tobacco comment: smokes  for the last years   Substance and Sexual Activity  . Alcohol use: Yes    Comment: occasional  . Drug use: No  . Sexual activity: Not on file  Lifestyle  . Physical activity    Days per week: 0 days    Minutes per session: 0 min  . Stress: Not at all  Relationships  . Social connections    Talks on phone: More than three times a week    Gets together: More than three times a week    Attends religious service: 1 to 4 times per year     Active member of club or organization: No    Attends meetings of clubs or organizations: Never    Relationship status: Married  . Intimate partner violence    Fear of current or ex partner: Not on file    Emotionally abused: Not on file    Physically abused: Not on file    Forced sexual activity: Not on file  Other Topics Concern  . Not on file  Social History Narrative  . Not on file     PHYSICAL EXAM  Vitals:   02/17/19 0939  BP: (!) 175/92  Pulse: 88  Temp: 97.8 F (36.6 C)  Weight: 215 lb 8 oz (97.8 kg)  Height: 5\' 10"  (1.778 m)    Body mass index is 30.92 kg/m.   General: The patient is well-developed and well-nourished and in no acute distress  HEENT:  Head is [place]/AT.  Sclera are anicteric.    Neck: No carotid bruits are noted.  The neck is nontender.  Cardiovascular: The heart has a regular rate and rhythm with a normal S1 and S2. There were no murmurs, gallops or rubs.    Skin: Extremities show pedal edema.  Musculoskeletal:  Back is nontender  Neurologic Exam  Mental status: The patient is alert and oriented x 3 at the time of the examination. The patient has apparent normal recent and remote memory, with an apparently normal attention span and concentration ability.   Speech is normal.  Cranial nerves: Extraocular movements are full. Pupils are equal, round, and reactive to light and accomodation.  Visual fields are full.  Facial symmetry is present. There is good facial sensation to soft touch bilaterally.Facial strength is normal.  Trapezius and sternocleidomastoid strength is normal. No dysarthria is noted.  The tongue is midline, and the patient has symmetric elevation of the soft palate. No obvious hearing deficits are noted.  Motor:  Muscle bulk is normal.   Tone is normal. Strength is  5 / 5 in the arms and left leg.  Strength is 2 -/5 in the ankle extensors and 1/5 in the toe extensors on the right.  Sensory: Sensory testing is intact at the hands.   He has reduced sensation to touch in the distribution  of the superficial peroneal nerve on the right.  Coordination: Cerebellar testing reveals good finger-nose-finger and reduced heel-to-shin bilaterally.  Gait and station: Station is normal.   He has bilateral foot drop, right greater than left  Reflexes: Deep tendon reflexes are symmetric and normal bilaterally.   Plantar responses are flexor.    DIAGNOSTIC DATA (LABS, IMAGING, TESTING) - I reviewed patient records, labs, notes, testing and imaging myself where available.  Lab Results  Component Value Date   WBC 10.9 (H) 02/06/2019   HGB 15.9 02/06/2019   HCT 47.1 02/06/2019   MCV 97 02/06/2019   PLT 294 02/06/2019      Component Value Date/Time   NA 145 (H) 02/06/2019 1134   K 4.3 02/06/2019 1134   CL 103 02/06/2019 1134   CO2 25 02/06/2019 1134   GLUCOSE 109 (H) 02/06/2019 1134   GLUCOSE 119 (H) 11/15/2018 1113   BUN 16 02/06/2019 1134   CREATININE 1.15 02/06/2019 1134   CALCIUM 10.2 02/06/2019 1134   PROT 7.3 02/06/2019 1134   ALBUMIN 4.7 02/06/2019 1134   AST 19 02/06/2019 1134   ALT 14 02/06/2019 1134   ALKPHOS 69 02/06/2019 1134   BILITOT 0.3 02/06/2019 1134   GFRNONAA 61 02/06/2019 1134   GFRAA 71 02/06/2019 1134   Lab Results  Component Value Date   CHOL 191 02/06/2019   HDL 60 02/06/2019   LDLCALC 95 02/06/2019   TRIG 211 (H) 02/06/2019   CHOLHDL 3.2 02/06/2019   Lab Results  Component Value Date   HGBA1C 5.5 05/01/2016   Lab Results  Component Value Date   [id number] 08/17/2016   Lab Results  Component Value Date   TSH 1.700 10/21/2018       ASSESSMENT AND PLAN  Neuropathy of right peroneal nerve - Plan: NCV with EMG(electromyography)  Chronic midline low back pain with right-sided sciatica - Plan: NCV with EMG(electromyography)  History of nephrolithiasis  Dizzy spells  Spinal stenosis of lumbar region without neurogenic claudication   In summary, Mr. Nyarko is a  78 year old man with right leg weakness and numbness.  This appears to be due to a peroneal neuropathy.  He could have a superimposed right L5 radiculopathy, especially given the appearance of the MRI.  He does have some pain at the fibular head and I will call in a steroid pack and start him on gabapentin.  We will check an NCV/EMG study to help sort out the relative contribution of peroneal neuropathy and L5 radiculopathy.  Hopefully he will continue to see some improvement in strength.  If no further improvement has occurred and the EMG is consistent with a peroneal neuropathy, we will consider referral to orthopedics.  I will see him during the EMG.  He should call sooner if he has significant new or worsening neurologic symptoms.  Richard A. Felecia Shelling, MD, [facility] [id number], [id number] AM Certified in Neurology, Clinical Neurophysiology, Sleep Medicine and Neuroimaging  [facility] [address] [place], [place] [address] [phone]

## 2019-02-24 ENCOUNTER — Telehealth: Payer: Self-pay | Admitting: Neurology

## 2019-02-24 ENCOUNTER — Telehealth: Payer: Self-pay | Admitting: Family Medicine

## 2019-02-24 DIAGNOSIS — Z0289 Encounter for other administrative examinations: Secondary | ICD-10-CM

## 2019-02-24 MED ORDER — METHYLPREDNISOLONE 4 MG PO TABS: ORAL_TABLET | ORAL | 0 refills | Status: DC

## 2019-02-24 NOTE — Telephone Encounter (Signed)
Patient saw neurology on 11/30. Do you want to do a note or would you rather the specialist handle it?

## 2019-02-24 NOTE — Telephone Encounter (Signed)
Dr. Felecia Shelling,  I spoke with patient today and he mentioned that the steroid pack you prescribed him really helped him. He says he took his last one yesterday and he notices a difference in the pain today when not taking it. He is requesting a possible refill of this until his NCV/EMG on 1/5.

## 2019-02-24 NOTE — Telephone Encounter (Signed)
Ok to do onemore steroid pack

## 2019-02-24 NOTE — Telephone Encounter (Signed)
Patient notified and verbalized that he would discuss with neurosurgeon this Wednesday during appt. He would like to go ahead with the handicap form. Will fill out and leave up front for pick up

## 2019-02-24 NOTE — Telephone Encounter (Signed)
I called the patient and informed him that Dr. Felecia Shelling will repeat his steroid pack once more. The patient was very appreciative. He will use [facility] again. He stated being off the steroid one day he has noticed a difference and is having to use a walking stick. I advised him to call our office if he does not improve with restarting the steroid. He verbalized understanding. He also asked if Dr. Felecia Shelling could provide a letter for work since he is unable to work. 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 He said he called pcp and they declined to write a note. He said we could send it to him and he would get it to the right person. I advised I would send the request to Dr. Felecia Shelling. He verbalized appreciation for the call.   Refill of medrol dose pack e-scribed to [facility].

## 2019-02-24 NOTE — Addendum Note (Signed)
Addended by: Gildardo Griffes on: 02/24/2019 03:19 PM   Modules accepted: Orders

## 2019-02-24 NOTE — Telephone Encounter (Signed)
Neurology should fill out his note. I can fill out the handicap form if needed, but they should be able to complete this also.

## 2019-02-25 ENCOUNTER — Encounter: Payer: Self-pay | Admitting: Neurology

## 2019-02-25 NOTE — Telephone Encounter (Signed)
I Printed off note and signed

## 2019-02-25 NOTE — Telephone Encounter (Signed)
Called pt, he would like letter mailed to him. I placed in mail today for him.

## 2019-02-26 DIAGNOSIS — M545 Low back pain: Secondary | ICD-10-CM | POA: Diagnosis not present

## 2019-02-26 DIAGNOSIS — M21371 Foot drop, right foot: Secondary | ICD-10-CM | POA: Insufficient documentation

## 2019-02-26 DIAGNOSIS — M25561 Pain in right knee: Secondary | ICD-10-CM | POA: Diagnosis not present

## 2019-02-26 DIAGNOSIS — M48062 Spinal stenosis, lumbar region with neurogenic claudication: Secondary | ICD-10-CM | POA: Diagnosis not present

## 2019-02-26 DIAGNOSIS — M5136 Other intervertebral disc degeneration, lumbar region: Secondary | ICD-10-CM | POA: Diagnosis not present

## 2019-02-27 ENCOUNTER — Other Ambulatory Visit: Payer: Self-pay | Admitting: Specialist

## 2019-02-27 ENCOUNTER — Other Ambulatory Visit (HOSPITAL_COMMUNITY): Payer: Self-pay | Admitting: Specialist

## 2019-02-27 DIAGNOSIS — M545 Low back pain, unspecified: Secondary | ICD-10-CM

## 2019-03-07 ENCOUNTER — Other Ambulatory Visit: Payer: Self-pay

## 2019-03-07 ENCOUNTER — Ambulatory Visit (HOSPITAL_COMMUNITY)
Admission: RE | Admit: 2019-03-07 | Discharge: 2019-03-07 | Disposition: A | Discharge status: Home / Self Care | Payer: Medicare Other | Source: Ambulatory Visit | Attending: Specialist | Admitting: Specialist

## 2019-03-07 DIAGNOSIS — M545 Low back pain, unspecified: Secondary | ICD-10-CM

## 2019-03-24 ENCOUNTER — Telehealth: Payer: Self-pay | Admitting: Neurology

## 2019-03-24 NOTE — Telephone Encounter (Signed)
Called and spoke with pt. Relayed Dr. Garth Bigness message. He verbalized understanding and appreciation.

## 2019-03-24 NOTE — Telephone Encounter (Signed)
Dr. Felecia Shelling- MRI in epic for you to review

## 2019-03-24 NOTE — Telephone Encounter (Signed)
Okay, I was able to look at the MRI and will discuss it with him during the visit tomorrow

## 2019-03-24 NOTE — Telephone Encounter (Signed)
Pt called stating that he had a MRI done on the 18th of Dec and did not know if that needed to be looked at before his test here tomorrow. Pt stated if there is any questions to call him. If not he will be here tomorrow for his NCV/EMG

## 2019-03-25 ENCOUNTER — Other Ambulatory Visit: Payer: Self-pay

## 2019-03-25 ENCOUNTER — Encounter: Payer: Self-pay | Admitting: Neurology

## 2019-03-25 ENCOUNTER — Ambulatory Visit (INDEPENDENT_AMBULATORY_CARE_PROVIDER_SITE_OTHER): Payer: Medicare Other | Admitting: Neurology

## 2019-03-25 ENCOUNTER — Ambulatory Visit: Payer: Medicare Other | Admitting: Neurology

## 2019-03-25 DIAGNOSIS — G5731 Lesion of lateral popliteal nerve, right lower limb: Secondary | ICD-10-CM | POA: Diagnosis not present

## 2019-03-25 DIAGNOSIS — M5441 Lumbago with sciatica, right side: Secondary | ICD-10-CM | POA: Diagnosis not present

## 2019-03-25 DIAGNOSIS — M48061 Spinal stenosis, lumbar region without neurogenic claudication: Secondary | ICD-10-CM

## 2019-03-25 DIAGNOSIS — G8929 Other chronic pain: Secondary | ICD-10-CM

## 2019-03-25 DIAGNOSIS — G5732 Lesion of lateral popliteal nerve, left lower limb: Secondary | ICD-10-CM | POA: Diagnosis not present

## 2019-03-25 NOTE — Progress Notes (Signed)
[facility]  PATIENT: Jesse Barton. DOB: Jul 18, 1940  REFERRING DOCTOR OR PCP: Darla Lesches, FNP SOURCE: Patient, notes from primary care, imaging and lab reports, MRI images of the lumbar spine personally reviewed.  _________________________________   HISTORICAL  CHIEF COMPLAINT:  Chief Complaint  Patient presents with  . Other    Leg weakness    HISTORY OF PRESENT ILLNESS:   Update 03/25/2019: Since last visit, Jesse Barton feels he is doing about the same.  He has difficulty with gait due to bilateral foot drops, worse on the right.  Symptoms on the right developed in October 2020.  The pain around the fibular head is reduced compared to last month.  He continues to also report back pain and pain that radiates into the legs at times.  Standing increases his pain.  I reviewed the MRI of the lumbar spine performed 03/07/2019 and compared it to his MRI from 2019.  At L2-L3, there is disc bulging and borderline spinal stenosis but no nerve root compression.  At L3-L4, there is moderate spinal stenosis due to a right disc extrusion and other degenerative changes.  There is moderately severe right foraminal narrowing and severe right lateral recess stenosis and moderate left foraminal and lateral recess stenosis.  There is probable right L3 and L4 nerve root compression.  This level has progressed mildly compared to the 2019 MRI.  At L4-L5, there is moderate spinal stenosis due to degenerative changes, moderate bilateral foraminal narrowing, moderately severe right and moderate left lateral recess stenosis.  There is possible L5 nerve root compression of the right.  At L5-S1, there are degenerative changes causing left greater than right foraminal narrowing without definite nerve root compression.    NCV/EMG 03/25/2019: Impression: This NCV/EMG study shows the following: 1.   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  The presence of acute denervation ation  implies an ongoing process. 2.    Moderately severe peroneal neuropathy at the left fibular head.  The presence of acute denervation implies an ongoing process 3.    Severe L3 radiculopathies with mixed acute and chronic features.  Moderate L4, L5 +/- S1 chronic radiculopathies.  Multiple radiculopathies are commonly seen with spinal stenosis. 4.    No evidence of significant polyneuropathy or myopathy.   From 02/17/2019 (initial consultation) He is a 79 year old man who has had a right foot drop that started 2 months ago.   It worsened quite a bit 2 weeks ago and he felt he needed to use a cane.  This morning seems slightly better than last week but not near baseline.  Around the time the symptoms started, he also noted pain at the fibular head and right above that region on the right.    Of note, the pain near the fibula head started after a fall in mid October.   He continues to have pain in the fibular head area of the right leg.    A steroid pack helped some with the pain.   He has pain (aching) and numbness going from the right buttock down the outer leg to the top of the right foot/ankle that started about 2 months ago.Marland Kitchen       He has had more issues with right leg pain over the last few months, possibly exacerbated with kidney stone treatments.   He notes LBP intermittently in any position, not necessarily worse with walking.   He notes he stoops more when he walks.  He does not have neurogenic claudication symptoms.   However, he had pain more to the left in 2019.  He has had 2 epidural steroid injections but that pain. The first ESI was November 2019 and he felt pain completely resolved.   A second ESi was performed for left leg pain in January 2020 but had less benefit.     He feels the left sided pain gradually improved.     ESI's were performed by Dr Maryjean Ka after he saw Dr. Christella Noa for the spinal stenosis.   He reportedly said they would consider surgery if the pain worsened.  He has had PT  for traction with some benefit before Covid-19/.   He has taken CBD oil with mild benefit for the back pain.      I personally reviewed the MRI of the lumbar spine performed 11/01/2017: At L2-L3, he has mild spinal stenosis and mild foraminal narrowing but no nerve root compression.  At L3-L4, he has moderately severe spinal stenosis with moderate right greater than left foraminal narrowing, moderately severe right and moderate left lateral recess stenosis.  There is possible right L4 nerve root compression.  At L4-L5, he has moderate spinal stenosis with moderately severe right and moderate left foraminal narrowing, moderately severe right greater than left lateral recess stenosis.  There is possible L5 nerve root compression to either side.  Degenerative changes cause moderate foraminal narrowing bilaterally at L5-S1 but there does not appear to be nerve root compression.  He also has issues with dizziness x 1 month.  He reported that this was a lightheaded sensation without vertigo.    Since his amlodipine was increased from 5 to 10 mg, he feels the dizziness improved.     After taking Tamiflu 2 years ago, he felt very weak and received IV fluids twice.     REVIEW OF SYSTEMS: Constitutional: No fevers, chills, sweats, or change in appetite.  He has some fatigue. Eyes: No visual changes, double vision, eye pain Ear, nose and throat: No hearing loss, ear pain, nasal congestion, sore throat Cardiovascular: No chest pain, palpitations Respiratory: No shortness of breath at rest or with exertion.   No wheezes GastrointestinaI: No nausea, vomiting, diarrhea, abdominal pain, fecal incontinence Genitourinary: No dysuria, urinary retention or frequency.  No nocturia. Musculoskeletal: No neck pain, back pain Integumentary: No rash, pruritus, skin lesions Neurological: as above Psychiatric: No depression at this time.  No anxiety Endocrine: No palpitations, diaphoresis, change in appetite, change in  weigh or increased thirst Hematologic/Lymphatic: No anemia, purpura, petechiae. Allergic/Immunologic: No itchy/runny eyes, nasal congestion, recent allergic reactions, rashes  ALLERGIES: Allergies  Allergen Reactions  . Ciprofloxacin Other (See Comments)    Increased lower extremity weakness  . Lipitor [Atorvastatin Calcium]     Generic lipitor caused joint pain  . Tamiflu [Oseltamivir]     Extreme weakness and joint instability     HOME MEDICATIONS:  Current Outpatient Medications:  .  amLODipine (NORVASC) 10 MG tablet, Take 1 tablet (10 mg total) by mouth daily., Disp: 90 tablet, Rfl: 3 .  aspirin (ASPIRIN ADULT LOW DOSE) 81 MG EC tablet, Take 1 tablet (81 mg total) by mouth daily. Swallow whole., Disp: 30 tablet, Rfl: 12 .  CALCIUM-MAGNESIUM-ZINC PO, Take 2 tablets by mouth daily., Disp: , Rfl:  .  ELDERBERRY PO, Take 5 mLs by mouth every morning., Disp: , Rfl:  .  gabapentin (NEURONTIN) 100 MG capsule, Take one pill po qAM, one po qPM and 2 po qHS, Disp:  120 capsule, Rfl: 3 .  Liniments (BLUE-EMU SUPER STRENGTH EX), Apply 1 application topically daily., Disp: , Rfl:  .  methylPREDNISolone (MEDROL) 4 MG tablet, Taper from 6 pills po for one day to 1 pill po the last day over 6 days, Disp: 21 tablet, Rfl: 0 .  simethicone (MYLICON) 0000000 MG chewable tablet, Chew 125 mg by mouth every 6 (six) hours as needed for flatulence., Disp: , Rfl:  .  UNABLE TO FIND, Take 1 Dose by mouth 2 (two) times daily. Med Name: CBD oil 10-15 drops in the morning and 10 drops at night, Disp: , Rfl:   PAST MEDICAL HISTORY: Past Medical History:  Diagnosis Date  . Cancer ([place])    Kidney  . History of kidney stones   . Hypertension   . Kidney stone   . Sciatic nerve pain     PAST SURGICAL HISTORY: Past Surgical History:  Procedure Laterality Date  . CATARACT EXTRACTION    . CYSTOSCOPY/URETEROSCOPY/HOLMIUM LASER/STENT PLACEMENT Right 11/19/2018   Procedure:  CYSTOSCOPY/RETROGRADE/URETEROSCOPY/HOLMIUM LASER/STENT PLACEMENT;  Surgeon: Festus Aloe, MD;  Location: [facility];  Service: Urology;  Laterality: Right;  . CYSTOSCOPY/URETEROSCOPY/HOLMIUM LASER/STENT PLACEMENT Right 12/17/2018   Procedure: CYSTOSCOP RIGHT  /URETEROSCOPY/HOLMIUM LASER/STENT EXCHANGE;  Surgeon: Festus Aloe, MD;  Location: [facility];  Service: Urology;  Laterality: Right;  . EYE SURGERY Bilateral    cataracts  . KIDNEY SURGERY    . NEPHRECTOMY Left     FAMILY HISTORY: Family History  Problem Relation Age of Onset  . Cancer Mother   . Leukemia Father   . Schizophrenia Sister   . Cancer Sister 27       kidney    SOCIAL HISTORY:  Social History   Socioeconomic History  . Marital status: Married    Spouse name: Not on file  . Number of children: 2  . Years of education: 63  . Highest education level: Some college, no degree  Occupational History  . Not on file  Tobacco Use  . Smoking status: Light Tobacco Smoker    Types: Cigars  . Smokeless tobacco: Never Used  . Tobacco comment: smokes  for the last years   Substance and Sexual Activity  . Alcohol use: Yes    Comment: occasional  . Drug use: No  . Sexual activity: Not on file  Other Topics Concern  . Not on file  Social History Narrative  . Not on file   Social Determinants of Health   Financial Resource Strain:   . Difficulty of Paying Living Expenses: Not on file  Food Insecurity:   . Worried About Charity fundraiser in the Last Year: Not on file  . Ran Out of Food in the Last Year: Not on file  Transportation Needs:   . Lack of Transportation (Medical): Not on file  . Lack of Transportation (Non-Medical): Not on file  Physical Activity:   . Days of Exercise per Week: Not on file  . Minutes of Exercise per Session: Not on file  Stress:   . Feeling of Stress : Not on file  Social Connections:   . Frequency of Communication with Friends and Family: Not on file  .  Frequency of Social Gatherings with Friends and Family: Not on file  . Attends Religious Services: Not on file  . Active Member of Clubs or Organizations: Not on file  . Attends Archivist Meetings: Not on file  . Marital Status: Not on file  Intimate Partner  Violence:   . Fear of Current or Ex-Partner: Not on file  . Emotionally Abused: Not on file  . Physically Abused: Not on file  . Sexually Abused: Not on file     PHYSICAL EXAM  There were no vitals filed for this visit.  There is no height or weight on file to calculate BMI.   General: The patient is well-developed and well-nourished and in no acute distress  Neurologic Exam  Mental status: The patient is alert and oriented x 3 at the time of the examination. The patient has apparent normal recent and remote memory, with an apparently normal attention span and concentration ability.   Speech is normal.  Cranial nerves: Extraocular movements are full.Facial strength is normal.  Trapezius and sternocleidomastoid strength is normal. No dysarthria is noted.   No obvious hearing deficits are noted.  Motor:  Muscle bulk is normal.   Tone is normal. Strength is  5 / 5 in the arms and left leg.  Strength is 1/5 in the ankle extensors and 1/5 in the toe extensors on the right and 3-4 minus/5 on the left.  He also has mild proximal leg weakness, right greater than left  Sensory: Sensory testing is intact at the hands.  He has reduced sensation in the top of the right foot.  There is mild reduced vibration sensation at the ankles and moderate reduced sensation at the toes, right worse than left.  Coordination: Cerebellar testing reveals good finger-nose-finger and reduced heel-to-shin bilaterally.  Gait and station: Station is normal.  He has foot drop, right greater than left.  He uses braces for his legs and a walking stick  Reflexes: Deep tendon reflexes are symmetric and normal in arms are reduced in the legs.        DIAGNOSTIC DATA (LABS, IMAGING, TESTING) - I reviewed patient records, labs, notes, testing and imaging myself where available.  Lab Results  Component Value Date   WBC 10.9 (H) 02/06/2019   HGB 15.9 02/06/2019   HCT 47.1 02/06/2019   MCV 97 02/06/2019   PLT 294 02/06/2019      Component Value Date/Time   NA 145 (H) 02/06/2019 1134   K 4.3 02/06/2019 1134   CL 103 02/06/2019 1134   CO2 25 02/06/2019 1134   GLUCOSE 109 (H) 02/06/2019 1134   GLUCOSE 119 (H) 11/15/2018 1113   BUN 16 02/06/2019 1134   CREATININE 1.15 02/06/2019 1134   CALCIUM 10.2 02/06/2019 1134   PROT 7.3 02/06/2019 1134   ALBUMIN 4.7 02/06/2019 1134   AST 19 02/06/2019 1134   ALT 14 02/06/2019 1134   ALKPHOS 69 02/06/2019 1134   BILITOT 0.3 02/06/2019 1134   GFRNONAA 61 02/06/2019 1134   GFRAA 71 02/06/2019 1134   Lab Results  Component Value Date   CHOL 191 02/06/2019   HDL 60 02/06/2019   LDLCALC 95 02/06/2019   TRIG 211 (H) 02/06/2019   CHOLHDL 3.2 02/06/2019   Lab Results  Component Value Date   HGBA1C 5.5 05/01/2016   Lab Results  Component Value Date   [id number] 08/17/2016   Lab Results  Component Value Date   TSH 1.700 10/21/2018       ASSESSMENT AND PLAN  Spinal stenosis of lumbar region without neurogenic claudication  Neuropathy of right peroneal nerve - Plan: NCV with EMG(electromyography)  Chronic midline low back pain with right-sided sciatica - Plan: NCV with EMG(electromyography)  Neuropathy of left peroneal nerve   1.   Because  he has ongoing denervation in the peroneal nerves, with more severe and more recent neuropathy on the right, he is advised to follow-up with orthopedics to determine if he is a candidate for peroneal decompression.  Additionally, he has severe radiculopathies, worse at the L3 level.  MRI shows a disc herniation to the right and moderate spinal stenosis with some progression compared to the 2019 MRI.  He is advised to follow-up with  surgery about this as well. 2.   Try to exercise as tolerated.  Stay active.  3.    He will return to see me in a couple months or sooner if there are new or worsening neurologic symptoms. Stephen Baruch A. Felecia Shelling, MD, [facility] [id number], [id number] PM Certified in Neurology, Clinical Neurophysiology, Sleep Medicine and Neuroimaging  [facility] [address] [place], [place] [address] [phone]

## 2019-03-25 NOTE — Progress Notes (Signed)
Full Name: Jesse Barton Gender: Male MRN #: [id number] Date of Birth: [birth year]    Visit Date: 03/25/2019 08:39 Age: 79 Years Examining Physician: Arlice Colt, MD  Referring Physician: Arlice Colt, MD    History Mr. Sylte is a 79 year old man with a 2-year history of left leg weakness and more recent right leg weakness with severe foot drop that started in mid November.  He notes pain near the right fibular head that started in October and a few weeks later he began to have more weakness in the right leg.  He feels symptoms are stable compared to last month.  On examination, he has 0/5 strength in the right peroneal innervated muscles and 3/5 strength in the left peroneal innervated muscles.  He also has some proximal weakness, right greater than left.  Nerve conduction studies: Left peroneal motor response was absent in the right peroneal motor response was markedly reduced in amplitude with normal latency and moderately reduced conduction velocity.  The right tibial motor response had a normal distal latency and conduction velocity with mild to moderate reduced amplitude.  The left tibial motor response was normal.  Bilateral sural sensory responses were normal.  Bilateral superficial peroneal sensory responses were normal for age (mildly reduced amplitude).  Tibial F-wave latencies were normal.  Electromyography: Needle EMG of selected muscles of both legs was performed.  In the right leg, there was no voluntary activity but moderate acute denervation in the tibialis anterior and peroneus longus muscles.  There was mild chronic denervation noted in the vastus medialis, gastrocnemius, abductor hallucis, gluteus medius and iliopsoas muscles on the right.  Acute denervation was also noted in the right iliopsoas muscle.    In the left leg, there was moderate acute and chronic denervation noted in the tibialis anterior, peroneus longus and vastus medialis muscles and mild chronic  denervation noted in the gastrocnemius, abductor hallucis and gluteus medius muscle.  Impression: This NCV/EMG study shows the following: 1.   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  The presence of acute denervation ation implies an ongoing process. 2.    Moderately severe peroneal neuropathy at the left fibular head.  The presence of acute denervation implies an ongoing process 3.    Severe L3 radiculopathies with mixed acute and chronic features.  Moderate L4, L5 +/- S1 chronic radiculopathies.  Multiple radiculopathies are commonly seen with spinal stenosis. 4.    No evidence of significant polyneuropathy or myopathy.   Alvin Diffee A. Felecia Shelling, MD, PhD, FAAN Certified in Neurology, Clinical Neurophysiology, Sleep Medicine, Pain Medicine and Neuroimaging Director, [facility] [address] [place], [place] [address] [phone]           [facility]    Nerve / Sites Muscle Latency Ref. Amplitude Ref. Rel Amp Segments Distance Velocity Ref. Area    ms ms mV mV %  cm m/s m/s mVms  R Peroneal - EDB     Ankle EDB 5.1 ?6.5 0.1 ?2.0 100 Ankle - EDB 9   0.5     Fib head EDB 14.5  0.1  69.9 Fib head - Ankle 30 32 ?44 0.3     Pop fossa EDB 17.5  0.1  98.3 Pop fossa - Fib head 10 33 ?44 0.3         Pop fossa - Ankle      L Peroneal - EDB  Ankle EDB NR ?6.5 NR ?2.0 NR Ankle - EDB 9   NR     Fib head EDB NR  NR  NR Fib head - Ankle 31 NR ?44 NR  R Tibial - AH     Ankle AH 5.6 ?5.8 1.8 ?4.0 100 Ankle - AH 9   4.6     Pop fossa AH 15.2  0.9  47.2 Pop fossa - Ankle 39 41 ?41 3.1  L Tibial - AH     Ankle AH 4.0 ?5.8 5.1 ?4.0 100 Ankle - AH 9   11.4     Pop fossa AH 13.6  3.3  64.4 Pop fossa - Ankle 39 41 ?41 10.8             SNC    Nerve / Sites Rec. Site Peak Lat Ref.  Amp Ref. Segments Distance    ms ms V V  cm  R Sural - Ankle (Calf)     Calf Ankle 3.8 ?4.4 13 ?6 Calf - Ankle 14  L  Sural - Ankle (Calf)     Calf Ankle 4.0 ?4.4 14 ?6 Calf - Ankle 14  R Superficial peroneal - Ankle     Lat leg Ankle 3.4 ?4.4 4 ?6 Lat leg - Ankle 14  L Superficial peroneal - Ankle     Lat leg Ankle 3.6 ?4.4 4 ?6 Lat leg - Ankle 14              F  Wave    Nerve F Lat Ref.   ms ms  R Tibial - AH 53.1 ?56.0  L Tibial - AH 55.4 ?56.0         EMG Summary Table    Spontaneous MUAP Recruitment  Muscle IA Fib PSW Fasc Other Amp Dur. Poly Pattern  R. Vastus medialis Normal None None None _______ Increased Increased 2+ Reduced  R. Tibialis anterior Normal 2+ 2+ None _______    No Activity  R. Peroneus longus Normal 1+ 1+ None _______    No Activity  R. Gastrocnemius (Medial head) Normal None None None _______ Normal Increased 1+ Reduced  R. Abductor hallucis Normal None 1+ None _______ Normal Normal 1+ Reduced  R. Gluteus medius Normal None None None _______ Normal Increased 2+ Reduced  R. Iliopsoas Normal 1+ 1+ None _______ Increased Increased 2+ Reduced  L. Gastrocnemius (Medial head) Normal None None None _______ Increased Increased 2+ Reduced  L. Abductor hallucis Normal None None None _______ Normal Increased 1+ Reduced  L. Tibialis anterior Normal 1+ 2+ None _______ Increased Increased 2+ Discrete  L. Peroneus longus Increased 2+ 2+ None _______ Normal Increased 2+ Discrete  L. Vastus medialis Increased 1+ None None _______ Normal Increased 2+ Discrete  L. Gluteus medius Normal None None None _______ Normal Normal Normal Normal

## 2019-04-01 DIAGNOSIS — M48062 Spinal stenosis, lumbar region with neurogenic claudication: Secondary | ICD-10-CM | POA: Diagnosis not present

## 2019-04-01 DIAGNOSIS — M5136 Other intervertebral disc degeneration, lumbar region: Secondary | ICD-10-CM | POA: Diagnosis not present

## 2019-04-08 ENCOUNTER — Telehealth: Payer: Self-pay | Admitting: *Deleted

## 2019-04-08 NOTE — Telephone Encounter (Signed)
Gave completed/signed FMLA forms to medical records to process for pt.

## 2019-04-18 DIAGNOSIS — M25562 Pain in left knee: Secondary | ICD-10-CM | POA: Diagnosis not present

## 2019-04-18 DIAGNOSIS — M48062 Spinal stenosis, lumbar region with neurogenic claudication: Secondary | ICD-10-CM | POA: Diagnosis not present

## 2019-04-18 DIAGNOSIS — M545 Low back pain: Secondary | ICD-10-CM | POA: Diagnosis not present

## 2019-04-18 DIAGNOSIS — M25561 Pain in right knee: Secondary | ICD-10-CM | POA: Diagnosis not present

## 2019-04-21 DIAGNOSIS — M25562 Pain in left knee: Secondary | ICD-10-CM | POA: Diagnosis not present

## 2019-04-21 DIAGNOSIS — M25561 Pain in right knee: Secondary | ICD-10-CM | POA: Diagnosis not present

## 2019-04-22 DIAGNOSIS — M25561 Pain in right knee: Secondary | ICD-10-CM | POA: Diagnosis not present

## 2019-04-22 DIAGNOSIS — M25562 Pain in left knee: Secondary | ICD-10-CM | POA: Diagnosis not present

## 2019-04-22 DIAGNOSIS — M21372 Foot drop, left foot: Secondary | ICD-10-CM | POA: Diagnosis not present

## 2019-04-22 DIAGNOSIS — M21371 Foot drop, right foot: Secondary | ICD-10-CM | POA: Diagnosis not present

## 2019-05-08 NOTE — Patient Instructions (Addendum)
DUE TO COVID-19 ONLY ONE VISITOR IS ALLOWED TO COME WITH YOU AND STAY IN THE WAITING ROOM ONLY DURING PRE OP AND PROCEDURE DAY OF SURGERY. THE 1 VISITOR MAY VISIT WITH YOU AFTER SURGERY IN YOUR PRIVATE ROOM DURING VISITING HOURS ONLY!  YOU NEED TO HAVE A COVID 19 TEST ON: 05/17/19 @  10:30 am   , THIS TEST MUST BE DONE BEFORE SURGERY, COME  [place], [place] , [address].  ([place]) ONCE YOUR COVID TEST IS COMPLETED, PLEASE BEGIN THE QUARANTINE INSTRUCTIONS AS OUTLINED IN YOUR HANDOUT.                [place]   Your procedure is scheduled on: 05/21/19   Report to [facility] Main  Entrance   Report to admitting at: 12:45 PM     Call this number if you have problems the morning of surgery [phone]    Remember:   [place], NO [place].     Take these medicines the morning of surgery with A SIP OF WATER: AMLODIPINE,GABAPENTIN,SIMRTHICONE.                                 You may not have any metal on your body including hair pins and              piercings  Do not wear jewelry, lotions, powders or perfumes, deodorant             Men may shave face and neck.   Do not bring valuables to the hospital. [place].  Contacts, dentures or bridgework may not be worn into surgery.  Leave suitcase in the car. After surgery it may be brought to your room.     Patients discharged the day of surgery will not be allowed to drive home. IF YOU ARE HAVING SURGERY AND GOING HOME THE SAME DAY, YOU MUST HAVE AN ADULT TO DRIVE YOU HOME AND BE WITH YOU FOR 24 HOURS. YOU MAY GO HOME BY TAXI OR UBER OR ORTHERWISE, BUT AN ADULT MUST ACCOMPANY YOU HOME AND STAY WITH YOU FOR 24 HOURS.  Name and phone number of your driver:  Special Instructions: N/A              Please read over the following fact sheets you were  given: _____________________________________________________________________             NO SOLID FOOD AFTER MIDNIGHT THE NIGHT PRIOR TO SURGERY. NOTHING BY MOUTH EXCEPT CLEAR LIQUIDS UNTIL: 11:45 AM .  CLEAR LIQUID DIET   Foods Allowed                                                                     Foods Excluded  Coffee and tea, regular and decaf                             liquids that you cannot  Plain Jell-O  any favor except red or purple                                           see through such as: Fruit ices (not with fruit pulp)                                     milk, soups, orange juice  Iced Popsicles                                    All solid food Carbonated beverages, regular and diet                                    Cranberry, grape and apple juices Sports drinks like Gatorade Lightly seasoned clear broth or consume(fat free) Sugar, honey syrup  Sample Menu Breakfast                                Lunch                                     Supper Cranberry juice                    Beef broth                            Chicken broth Jell-O                                     Grape juice                           Apple juice Coffee or tea                        Jell-O                                      Popsicle                                                Coffee or tea                        Coffee or tea  _____________________________________________________________________  [facility] - Preparing for Surgery Before surgery, you can play an important role.  Because skin is not sterile, your skin needs to be as free of germs as possible.  You can reduce the number of germs on your skin by washing with CHG (chlorahexidine gluconate) soap before surgery.  CHG is an antiseptic cleaner which kills germs and bonds with the skin to  continue killing germs even after washing. Please DO NOT use if you have an allergy to CHG or antibacterial soaps.  If your skin  becomes reddened/irritated stop using the CHG and inform your nurse when you arrive at Short Stay. Do not shave (including legs and underarms) for at least 48 hours prior to the first CHG shower.  You may shave your face/neck. Please follow these instructions carefully:  1.  Shower with CHG Soap the night before surgery and the  morning of Surgery.  2.  If you choose to wash your hair, wash your hair first as usual with your  normal  shampoo.  3.  After you shampoo, rinse your hair and body thoroughly to remove the  shampoo.                           4.  Use CHG as you would any other liquid soap.  You can apply chg directly  to the skin and wash                       Gently with a scrungie or clean washcloth.  5.  Apply the CHG Soap to your body ONLY FROM THE NECK DOWN.   Do not use on face/ open                           Wound or open sores. Avoid contact with eyes, ears mouth and genitals (private parts).                       Wash face,  Genitals (private parts) with your normal soap.             6.  Wash thoroughly, paying special attention to the area where your surgery  will be performed.  7.  Thoroughly rinse your body with warm water from the neck down.  8.  DO NOT shower/wash with your normal soap after using and rinsing off  the CHG Soap.                9.  Pat yourself dry with a clean towel.            10.  Wear clean pajamas.            11.  Place clean sheets on your bed the night of your first shower and do not  sleep with pets. Day of Surgery : Do not apply any lotions/deodorants the morning of surgery.  Please wear clean clothes to the hospital/surgery center.  FAILURE TO FOLLOW THESE INSTRUCTIONS MAY RESULT IN THE CANCELLATION OF YOUR SURGERY PATIENT SIGNATURE_________________________________  NURSE SIGNATURE__________________________________  ________________________________________________________________________   Jesse Barton  An incentive spirometer is a  tool that can help keep your lungs clear and active. This tool measures how well you are filling your lungs with each breath. Taking long deep breaths may help reverse or decrease the chance of developing breathing (pulmonary) problems (especially infection) following:  A long period of time when you are unable to move or be active. BEFORE THE PROCEDURE   If the spirometer includes an indicator to show your best effort, your nurse or respiratory therapist will set it to a desired goal.  If possible, sit up straight or lean slightly forward. Try not to slouch.  Hold the incentive spirometer in an upright position. INSTRUCTIONS FOR USE  1.  Sit on the edge of your bed if possible, or sit up as far as you can in bed or on a chair. 2. Hold the incentive spirometer in an upright position. 3. Breathe out normally. 4. Place the mouthpiece in your mouth and seal your lips tightly around it. 5. Breathe in slowly and as deeply as possible, raising the piston or the ball toward the top of the column. 6. Hold your breath for 3-5 seconds or for as long as possible. Allow the piston or ball to fall to the bottom of the column. 7. Remove the mouthpiece from your mouth and breathe out normally. 8. Rest for a few seconds and repeat Steps 1 through 7 at least 10 times every 1-2 hours when you are awake. Take your time and take a few normal breaths between deep breaths. 9. The spirometer may include an indicator to show your best effort. Use the indicator as a goal to work toward during each repetition. 10. After each set of 10 deep breaths, practice coughing to be sure your lungs are clear. If you have an incision (the cut made at the time of surgery), support your incision when coughing by placing a pillow or rolled up towels firmly against it. Once you are able to get out of bed, walk around indoors and cough well. You may stop using the incentive spirometer when instructed by your caregiver.  RISKS AND  COMPLICATIONS  Take your time so you do not get dizzy or light-headed.  If you are in pain, you may need to take or ask for pain medication before doing incentive spirometry. It is harder to take a deep breath if you are having pain. AFTER USE  Rest and breathe slowly and easily.  It can be helpful to keep track of a log of your progress. Your caregiver can provide you with a simple table to help with this. If you are using the spirometer at home, follow these instructions: [place] IF:   You are having difficultly using the spirometer.  You have trouble using the spirometer as often as instructed.  Your pain medication is not giving enough relief while using the spirometer.  You develop fever of 100.5 F (38.1 C) or higher. SEEK IMMEDIATE MEDICAL CARE IF:   You cough up bloody sputum that had not been present before.  You develop fever of 102 F (38.9 C) or greater.  You develop worsening pain at or near the incision site. MAKE SURE YOU:   Understand these instructions.  Will watch your condition.  Will get help right away if you are not doing well or get worse. Document Released: 07/17/2006 Document Revised: 05/29/2011 Document Reviewed: 09/17/2006 [facility] Patient Information 2014 [place], [place].   ________________________________________________________________________

## 2019-05-12 ENCOUNTER — Encounter (HOSPITAL_COMMUNITY): Payer: Self-pay

## 2019-05-12 ENCOUNTER — Encounter (HOSPITAL_COMMUNITY)
Admission: RE | Admit: 2019-05-12 | Discharge: 2019-05-12 | Disposition: A | Discharge status: Home / Self Care | Payer: Medicare Other | Source: Ambulatory Visit | Attending: Orthopedic Surgery | Admitting: Orthopedic Surgery

## 2019-05-12 ENCOUNTER — Other Ambulatory Visit: Payer: Self-pay

## 2019-05-12 DIAGNOSIS — Z01818 Encounter for other preprocedural examination: Secondary | ICD-10-CM | POA: Diagnosis not present

## 2019-05-12 DIAGNOSIS — Z01812 Encounter for preprocedural laboratory examination: Secondary | ICD-10-CM | POA: Diagnosis not present

## 2019-05-12 DIAGNOSIS — F17210 Nicotine dependence, cigarettes, uncomplicated: Secondary | ICD-10-CM | POA: Diagnosis not present

## 2019-05-12 HISTORY — DX: Unspecified osteoarthritis, unspecified site: M19.90

## 2019-05-12 NOTE — Progress Notes (Signed)
PCP - Dr. Darla Lesches. LOV: 02/06/19 Cardiologist -   Chest x-ray -  EKG - 11/15/18. EPIC Stress Test -  ECHO -  Cardiac Cath -   Sleep Study -  CPAP -   Fasting Blood Sugar -  Checks Blood Sugar _____ times a day  Blood Thinner Instructions: Aspirin Instructions: Last Dose: 04/21/19  Anesthesia review: RN advised pt. About the recommendation to stop smoking before surgery,pt. Verbalized his understanding of this subject.  Patient denies shortness of breath, fever, cough and chest pain at PAT appointment   Patient verbalized understanding of instructions that were given to them at the PAT appointment. Patient was also instructed that they will need to review over the PAT instructions again at home before surgery.

## 2019-05-13 ENCOUNTER — Encounter (HOSPITAL_COMMUNITY)
Admission: RE | Admit: 2019-05-13 | Discharge: 2019-05-13 | Disposition: A | Discharge status: Home / Self Care | Payer: Medicare Other | Source: Ambulatory Visit | Attending: Orthopedic Surgery | Admitting: Orthopedic Surgery

## 2019-05-13 DIAGNOSIS — Z01812 Encounter for preprocedural laboratory examination: Secondary | ICD-10-CM | POA: Diagnosis not present

## 2019-05-13 DIAGNOSIS — Z01818 Encounter for other preprocedural examination: Secondary | ICD-10-CM | POA: Diagnosis not present

## 2019-05-13 LAB — BASIC METABOLIC PANEL
Anion gap: 12 (ref 5–15)
BUN: 20 mg/dL (ref 8–23)
CO2: 25 mmol/L (ref 22–32)
Calcium: 9.7 mg/dL (ref 8.9–10.3)
Chloride: 105 mmol/L (ref 98–111)
Creatinine, Ser: 1 mg/dL (ref 0.61–1.24)
GFR calc Af Amer: >60 mL/min (ref >60)
GFR calc non Af Amer: >60 mL/min (ref >60)
Glucose, Bld: 107 mg/dL — ABNORMAL HIGH (ref 70–99)
Potassium: 4.3 mmol/L (ref 3.5–5.1)
Sodium: 142 mmol/L (ref 135–145)

## 2019-05-13 LAB — CBC
HCT: 42.1 % (ref 39.0–52.0)
Hemoglobin: 14.6 g/dL (ref 13.0–17.0)
MCH: 33.4 pg (ref 26.0–34.0)
MCHC: 34.7 g/dL (ref 30.0–36.0)
MCV: 96.3 fL (ref 80.0–100.0)
Platelets: 237 10*3/uL (ref 150–400)
RBC: 4.37 MIL/uL (ref 4.22–5.81)
RDW: 13.2 % (ref 11.5–15.5)
WBC: 6.3 10*3/uL (ref 4.0–10.5)
nRBC: 0 % (ref 0.0–0.2)

## 2019-05-15 ENCOUNTER — Other Ambulatory Visit: Payer: Self-pay

## 2019-05-15 ENCOUNTER — Ambulatory Visit: Payer: Medicare Other | Admitting: Family Medicine

## 2019-05-15 ENCOUNTER — Telehealth: Payer: Self-pay | Admitting: *Deleted

## 2019-05-15 MED ORDER — GABAPENTIN 100 MG PO CAPS: 200.0000 mg | ORAL_CAPSULE | Freq: Three times a day (TID) | ORAL | 5 refills | Status: DC

## 2019-05-15 NOTE — Telephone Encounter (Signed)
I sent in the gabapentin refill at 200 tid

## 2019-05-15 NOTE — Telephone Encounter (Signed)
Took call from phone staff and spoke with pt. He reports he has been to Dr. Maxie Better who did repeat MRI. Was sent to Dr.Aluisio and he now is having back surgery this Wednesday. Pt did not want pain medication from Dr. Maxie Better when he offered and he said Dr. Maxie Better told him to increase his gabapentin dose instead. He is now taking 2-100mg  capsules po TID. Wanting to know if Dr. Felecia Shelling is ok with calling in refill for this dose to [facility]

## 2019-05-15 NOTE — Telephone Encounter (Signed)
Called pt back. Advised Dr. Felecia Shelling called in gabapentin for him. He verbalized understanding.

## 2019-05-16 ENCOUNTER — Ambulatory Visit (INDEPENDENT_AMBULATORY_CARE_PROVIDER_SITE_OTHER): Payer: Medicare Other | Admitting: Family Medicine

## 2019-05-16 ENCOUNTER — Encounter: Payer: Self-pay | Admitting: Family Medicine

## 2019-05-16 VITALS — BP 146/79 | HR 79 | Temp 99.0°F | Resp 20 | Ht 70.0 in | Wt 227.0 lb

## 2019-05-16 DIAGNOSIS — E782 Mixed hyperlipidemia: Secondary | ICD-10-CM | POA: Diagnosis not present

## 2019-05-16 DIAGNOSIS — Z23 Encounter for immunization: Secondary | ICD-10-CM

## 2019-05-16 DIAGNOSIS — I10 Essential (primary) hypertension: Secondary | ICD-10-CM

## 2019-05-16 DIAGNOSIS — M21371 Foot drop, right foot: Secondary | ICD-10-CM

## 2019-05-16 DIAGNOSIS — Z6832 Body mass index (BMI) 32.0-32.9, adult: Secondary | ICD-10-CM

## 2019-05-16 DIAGNOSIS — M21372 Foot drop, left foot: Secondary | ICD-10-CM

## 2019-05-16 NOTE — Patient Instructions (Addendum)
DASH Eating Plan DASH stands for "Dietary Approaches to Stop Hypertension." The DASH eating plan is a healthy eating plan that has been shown to reduce high blood pressure (hypertension). Additional health benefits may include reducing the risk of type 2 diabetes mellitus, heart disease, and stroke. The DASH eating plan may also help with weight loss.  WHAT DO I NEED TO KNOW ABOUT THE DASH EATING PLAN? For the DASH eating plan, you will follow these general guidelines:  Choose foods with a percent daily value for sodium of less than 5% (as listed on the food label).  Use salt-free seasonings or herbs instead of table salt or sea salt.  Check with your health care provider or pharmacist before using salt substitutes.  Eat lower-sodium products, often labeled as "lower sodium" or "no salt added."  Eat fresh foods.  Eat more vegetables, fruits, and low-fat dairy products.  Choose whole grains. Look for the word "whole" as the first word in the ingredient list.  Choose fish and skinless chicken or turkey more often than red meat. Limit fish, poultry, and meat to 6 oz (170 g) each day.  Limit sweets, desserts, sugars, and sugary drinks.  Choose heart-healthy fats.  Limit cheese to 1 oz (28 g) per day.  Eat more home-cooked food and less restaurant, buffet, and fast food.  Limit fried foods.  Cook foods using methods other than frying.  Limit canned vegetables. If you do use them, rinse them well to decrease the sodium.  When eating at a restaurant, ask that your food be prepared with less salt, or no salt if possible.  WHAT FOODS CAN I EAT? Seek help from a dietitian for individual calorie needs.  Grains Whole grain or whole wheat bread. Brown rice. Whole grain or whole wheat pasta. Quinoa, bulgur, and whole grain cereals. Low-sodium cereals. Corn or whole wheat flour tortillas. Whole grain cornbread. Whole grain crackers. Low-sodium crackers.  Vegetables Fresh or frozen  vegetables (raw, steamed, roasted, or grilled). Low-sodium or reduced-sodium tomato and vegetable juices. Low-sodium or reduced-sodium tomato sauce and paste. Low-sodium or reduced-sodium canned vegetables.   Fruits All fresh, canned (in natural juice), or frozen fruits.  Meat and Other Protein Products Ground beef (85% or leaner), grass-fed beef, or beef trimmed of fat. Skinless chicken or turkey. Ground chicken or turkey. Pork trimmed of fat. All fish and seafood. Eggs. Dried beans, peas, or lentils. Unsalted nuts and seeds. Unsalted canned beans.  Dairy Low-fat dairy products, such as skim or 1% milk, 2% or reduced-fat cheeses, low-fat ricotta or cottage cheese, or plain low-fat yogurt. Low-sodium or reduced-sodium cheeses.  Fats and Oils Tub margarines without trans fats. Light or reduced-fat mayonnaise and salad dressings (reduced sodium). Avocado. Safflower, olive, or canola oils. Natural peanut or almond butter.  Other Unsalted popcorn and pretzels. The items listed above may not be a complete list of recommended foods or beverages. Contact your dietitian for more options.  WHAT FOODS ARE NOT RECOMMENDED?  Grains White bread. White pasta. White rice. Refined cornbread. Bagels and croissants. Crackers that contain trans fat.  Vegetables Creamed or fried vegetables. Vegetables in a cheese sauce. Regular canned vegetables. Regular canned tomato sauce and paste. Regular tomato and vegetable juices.  Fruits Dried fruits. Canned fruit in light or heavy syrup. Fruit juice.  Meat and Other Protein Products Fatty cuts of meat. Ribs, chicken wings, bacon, sausage, bologna, salami, chitterlings, fatback, hot dogs, bratwurst, and packaged luncheon meats. Salted nuts and seeds. Canned beans with salt.    Dairy Whole or 2% milk, cream, half-and-half, and cream cheese. Whole-fat or sweetened yogurt. Full-fat cheeses or blue cheese. Nondairy creamers and whipped toppings. Processed cheese,  cheese spreads, or cheese curds.  Condiments Onion and garlic salt, seasoned salt, table salt, and sea salt. Canned and packaged gravies. Worcestershire sauce. Tartar sauce. Barbecue sauce. Teriyaki sauce. Soy sauce, including reduced sodium. Steak sauce. Fish sauce. Oyster sauce. Cocktail sauce. Horseradish. Ketchup and mustard. Meat flavorings and tenderizers. Bouillon cubes. Hot sauce. Tabasco sauce. Marinades. Taco seasonings. Relishes.  Fats and Oils Butter, stick margarine, lard, shortening, ghee, and bacon fat. Coconut, palm kernel, or palm oils. Regular salad dressings.  Other Pickles and olives. Salted popcorn and pretzels.  The items listed above may not be a complete list of foods and beverages to avoid. Contact your dietitian for more information.  WHERE CAN I FIND MORE INFORMATION? National Heart, Lung, and Blood Institute: travelstabloid.com Document Released: 02/23/2011 Document Revised: 07/21/2013 Document Reviewed: 01/08/2013 [facility] Patient Information 2015 [place], [place]. This information is not intended to replace advice given to you by your health care provider. Make sure you discuss any questions you have with your health care provider.   I think that you would greatly benefit from seeing a nutritionist.  If you are interested, please call Dr Jenne Campus at [phone] to schedule an appointment.   Chronic Pain, Adult Chronic pain is a type of pain that lasts or keeps coming back (recurs) for at least six months. You may have chronic headaches, abdominal pain, or body pain. Chronic pain may be related to an illness, such as fibromyalgia or complex regional pain syndrome. Sometimes the cause of chronic pain is not known. Chronic pain can make it hard for you to do daily activities. If not treated, chronic pain can lead to other health problems, including anxiety and depression. Treatment depends on the cause and severity of your pain. You may  need to work with a pain specialist to come up with a treatment plan. The plan may include medicine, counseling, and physical therapy. Many people benefit from a combination of two or more types of treatment to control their pain. Follow these instructions at home: Lifestyle  Consider keeping a pain diary to share with your health care providers.  Consider talking with a mental health care provider (psychologist) about how to cope with chronic pain.  Consider joining a chronic pain support group.  Try to control or lower your stress levels. Talk to your health care provider about strategies to do this. General instructions   Take over-the-counter and prescription medicines only as told by your health care provider.  Follow your treatment plan as told by your health care provider. This may include: ? Gentle, regular exercise. ? Eating a healthy diet that includes foods such as vegetables, fruits, fish, and lean meats. ? Cognitive or behavioral therapy. ? Working with a Community education officer. ? Meditation or yoga. ? Acupuncture or massage therapy. ? Aroma, color, light, or sound therapy. ? Local electrical stimulation. ? Shots (injections) of numbing or pain-relieving medicines into the spine or the area of pain.  Check your pain level as told by your health care provider. Ask your health care provider if you should use a pain scale.  Learn as much as you can about how to manage your chronic pain. Ask your health care provider if an intensive pain rehabilitation program or a chronic pain specialist would be helpful.  Keep all follow-up visits as told by your health care provider. This  is important. Contact a health care provider if:  Your pain gets worse.  You have new pain.  You have trouble sleeping.  You have trouble doing your normal activities.  Your pain is not controlled with treatment.  Your have side effects from pain medicine.  You feel weak. Get help right away  if:  You lose feeling or have numbness in your body.  You lose control of bowel or bladder function.  Your pain suddenly gets much worse.  You develop shaking or chills.  You develop confusion.  You develop chest pain.  You have trouble breathing or shortness of breath.  You pass out.  You have thoughts about hurting yourself or others. This information is not intended to replace advice given to you by your health care provider. Make sure you discuss any questions you have with your health care provider. Document Revised: 02/16/2017 Document Reviewed: 08/24/2015 Elsevier Patient Education  [place].

## 2019-05-16 NOTE — Progress Notes (Signed)
Subjective:  Patient ID: Jesse Barton., male    DOB: 05-29-1940, 79 y.o.   MRN: [id number]  Patient Care Team: Baruch Gouty, FNP as PCP - General (Family Medicine)   Chief Complaint:  Medical Management of Chronic Issues (3 mo ) and Hypertension   HPI: Jesse Barton. is a 79 y.o. male presenting on 05/16/2019 for Medical Management of Chronic Issues (3 mo ) and Hypertension   1. Essential hypertension Taking medications as prescribed. Denies headache, dizziness, visual changes, shortness of breath, or chest pain. Is not adherent to a low salt diet.   2. Mixed hyperlipidemia Taking medications as prescribed. Is not adherent to a low fat diet. Does not exercise due to chronic low back pain with bilateral foot drop.   3. BMI 32.0-32.9,adult Does not watch diet. Does not exercise on a regular basis due to chronic lower back pain with bilateral foot drop.   4. Bilateral foot-drop Ongoing. Has upcoming surgery for right peroneal nerve decompression. This is scheduled for next Wednesday. Still using cane for ambulation assistance.      Relevant past medical, surgical, family, and social history reviewed and updated as indicated.  Allergies and medications reviewed and updated. Date reviewed: Chart in Epic.   Past Medical History:  Diagnosis Date  . Arthritis   . Cancer ([place])    Kidney  . History of kidney stones   . Hypertension   . Kidney stone   . Sciatic nerve pain     Past Surgical History:  Procedure Laterality Date  . CATARACT EXTRACTION    . CYSTOSCOPY/URETEROSCOPY/HOLMIUM LASER/STENT PLACEMENT Right 11/19/2018   Procedure: CYSTOSCOPY/RETROGRADE/URETEROSCOPY/HOLMIUM LASER/STENT PLACEMENT;  Surgeon: Festus Aloe, MD;  Location: [facility];  Service: Urology;  Laterality: Right;  . CYSTOSCOPY/URETEROSCOPY/HOLMIUM LASER/STENT PLACEMENT Right 12/17/2018   Procedure: CYSTOSCOP RIGHT  /URETEROSCOPY/HOLMIUM LASER/STENT EXCHANGE;  Surgeon: Festus Aloe, MD;   Location[facility];  Service: Urology;  Laterality: Right;  . EYE SURGERY Bilateral    cataracts  . KIDNEY SURGERY    . NEPHRECTOMY Left     Social History   Socioeconomic History  . Marital status: Married    Spouse name: Not on file  . Number of children: 2  . Years of education: 50  . Highest education level: Some college, no degree  Occupational History  . Not on file  Tobacco Use  . Smoking status: Light Tobacco Smoker    Types: Cigars  . Smokeless tobacco: Never Used  . Tobacco comment: smokes  for the last years   Substance and Sexual Activity  . Alcohol use: Yes    Comment: occasional  . Drug use: Yes    Types: Marijuana    Comment: cbd oil  . Sexual activity: Not on file  Other Topics Concern  . Not on file  Social History Narrative  . Not on file   Social Determinants of Health   Financial Resource Strain:   . Difficulty of Paying Living Expenses: Not on file  Food Insecurity:   . Worried About Charity fundraiser in the Last Year: Not on file  . Ran Out of Food in the Last Year: Not on file  Transportation Needs:   . Lack of Transportation (Medical): Not on file  . Lack of Transportation (Non-Medical): Not on file  Physical Activity:   . Days of Exercise per Week: Not on file  . Minutes of Exercise per Session: Not on file  Stress:   .  Feeling of Stress : Not on file  Social Connections:   . Frequency of Communication with Friends and Family: Not on file  . Frequency of Social Gatherings with Friends and Family: Not on file  . Attends Religious Services: Not on file  . Active Member of Clubs or Organizations: Not on file  . Attends Archivist Meetings: Not on file  . Marital Status: Not on file  Intimate Partner Violence:   . Fear of Current or Ex-Partner: Not on file  . Emotionally Abused: Not on file  . Physically Abused: Not on file  . Sexually Abused: Not on file    Outpatient Encounter Medications as of  05/16/2019  Medication Sig  . acetaminophen (TYLENOL) 650 MG CR tablet Take 1,300 mg by mouth in the morning, at noon, and at bedtime.  Marland Kitchen amLODipine (NORVASC) 10 MG tablet Take 1 tablet (10 mg total) by mouth daily. (Patient taking differently: Take 10 mg by mouth every evening. )  . aspirin (ASPIRIN ADULT LOW DOSE) 81 MG EC tablet Take 1 tablet (81 mg total) by mouth daily. Swallow whole.  . Calcium-Magnesium-Zinc (CAL-MAG-ZINC PO) Take 1 tablet by mouth in the morning and at bedtime.  Marland Kitchen CANNABIDIOL PO Take 10 drops by mouth in the morning and at bedtime. CBD OIL  . ELDERBERRY PO Take 15 mLs by mouth daily. In the morning  . fluticasone (FLONASE) 50 MCG/ACT nasal spray Place 1-2 sprays into both nostrils daily as needed for allergies or rhinitis (sinus/congestion.).  Marland Kitchen gabapentin (NEURONTIN) 100 MG capsule Take 2 capsules (200 mg total) by mouth in the morning, at noon, and at bedtime.  . Liniments (BLUE-EMU SUPER STRENGTH EX) Apply 1 application topically 4 (four) times daily as needed (pain.).   Marland Kitchen methylPREDNISolone (MEDROL) 4 MG tablet Taper from 6 pills po for one day to 1 pill po the last day over 6 days  . simethicone (MYLICON) 0000000 MG chewable tablet Chew 125 mg by mouth every 6 (six) hours as needed for flatulence.   No facility-administered encounter medications on file as of 05/16/2019.    Allergies  Allergen Reactions  . Ciprofloxacin Other (See Comments)    Increased lower extremity weakness  . Lipitor [Atorvastatin Calcium]     Generic lipitor caused joint pain  . Tamiflu [Oseltamivir]     Extreme weakness and joint instability     Review of Systems  Constitutional: Negative for activity change, appetite change, chills, diaphoresis, fatigue, fever and unexpected weight change.  HENT: Negative.   Eyes: Negative.  Negative for photophobia and visual disturbance.  Respiratory: Negative for cough, chest tightness and shortness of breath.   Cardiovascular: Negative for chest  pain, palpitations and leg swelling.  Gastrointestinal: Negative for abdominal pain, blood in stool, constipation, diarrhea, nausea and vomiting.  Endocrine: Negative.  Negative for cold intolerance, heat intolerance, polydipsia, polyphagia and polyuria.  Genitourinary: Negative for decreased urine volume, difficulty urinating, dysuria, frequency and urgency.  Musculoskeletal: Positive for arthralgias, back pain, gait problem and myalgias.  Skin: Negative.   Allergic/Immunologic: Negative.   Neurological: Negative for dizziness, tremors, seizures, syncope, facial asymmetry, speech difficulty, weakness, light-headedness, numbness and headaches.  Hematological: Negative.   Psychiatric/Behavioral: Negative for confusion, hallucinations, sleep disturbance and suicidal ideas.  All other systems reviewed and are negative.       Objective:  BP (!) 146/79   Pulse 79   Temp 99 F (37.2 C)   Resp 20   Ht 5\' 10"  (1.778 m)  Wt 227 lb (103 kg)   SpO2 97%   BMI 32.57 kg/m    Wt Readings from Last 3 Encounters:  05/16/19 227 lb (103 kg)  05/13/19 223 lb (101.2 kg)  05/12/19 213 lb (96.6 kg)    Physical Exam Vitals and nursing note reviewed.  Constitutional:      General: He is not in acute distress.    Appearance: Normal appearance. He is well-developed and well-groomed. He is obese. He is not ill-appearing, toxic-appearing or diaphoretic.  HENT:     Head: Normocephalic and atraumatic.     Jaw: There is normal jaw occlusion.     Right Ear: Hearing normal.     Left Ear: Hearing normal.     Nose: Nose normal.     Mouth/Throat:     Lips: Pink.     Mouth: Mucous membranes are moist.     Pharynx: Oropharynx is clear. Uvula midline.  Eyes:     General: Lids are normal.     Extraocular Movements: Extraocular movements intact.     Conjunctiva/sclera: Conjunctivae normal.     Pupils: Pupils are equal, round, and reactive to light.  Neck:     Thyroid: No thyroid mass, thyromegaly or  thyroid tenderness.     Vascular: No carotid bruit or JVD.     Trachea: Trachea and phonation normal.  Cardiovascular:     Rate and Rhythm: Normal rate and regular rhythm.     Chest Wall: PMI is not displaced.     Pulses: Normal pulses.     Heart sounds: Normal heart sounds. No murmur. No friction rub. No gallop.   Pulmonary:     Effort: Pulmonary effort is normal. No respiratory distress.     Breath sounds: Normal breath sounds. No wheezing.  Abdominal:     General: Bowel sounds are normal. There is no distension or abdominal bruit.     Palpations: Abdomen is soft. There is no hepatomegaly or splenomegaly.     Tenderness: There is no abdominal tenderness. There is no right CVA tenderness or left CVA tenderness.     Hernia: No hernia is present.  Musculoskeletal:     Cervical back: Neck supple.     Lumbar back: Tenderness present. Decreased range of motion.     Right hip: Decreased range of motion.     Left hip: Decreased range of motion.     Right knee: Normal.     Left knee: Normal.     Right lower leg: No edema.     Left lower leg: No edema.  Lymphadenopathy:     Cervical: No cervical adenopathy.  Skin:    General: Skin is warm and dry.     Capillary Refill: Capillary refill takes less than 2 seconds.     Coloration: Skin is not cyanotic, jaundiced or pale.     Findings: No rash.  Neurological:     General: No focal deficit present.     Mental Status: He is alert and oriented to person, place, and time.     Cranial Nerves: Cranial nerves are intact. No cranial nerve deficit.     Sensory: Sensation is intact.     Motor: Motor function is intact. No weakness.     Coordination: Coordination is intact.     Gait: Gait abnormal (bilateral foot drop, uses cane).     Deep Tendon Reflexes: Reflexes are normal and symmetric.  Psychiatric:        Attention and Perception: Attention and perception  normal.        Mood and Affect: Mood and affect normal.        Speech: Speech normal.         Behavior: Behavior normal. Behavior is cooperative.        Thought Content: Thought content normal.        Cognition and Memory: Cognition and memory normal.        Judgment: Judgment normal.     Results for orders placed or performed during the hospital encounter of [id number]  Basic metabolic panel  Result Value Ref Range   Sodium 142 135 - 145 mmol/L   Potassium 4.3 3.5 - 5.1 mmol/L   Chloride 105 98 - 111 mmol/L   CO2 25 22 - 32 mmol/L   Glucose, Bld 107 (H) 70 - 99 mg/dL   BUN 20 8 - 23 mg/dL   Creatinine, Ser 1.00 0.61 - 1.24 mg/dL   Calcium 9.7 8.9 - 10.3 mg/dL   GFR calc non Af Amer >60 >60 mL/min   GFR calc Af Amer >60 >60 mL/min   Anion gap 12 5 - 15  CBC  Result Value Ref Range   WBC 6.3 4.0 - 10.5 K/uL   RBC 4.37 4.22 - 5.81 MIL/uL   Hemoglobin 14.6 13.0 - 17.0 g/dL   HCT 42.1 39.0 - 52.0 %   MCV 96.3 80.0 - 100.0 fL   MCH 33.4 26.0 - 34.0 pg   MCHC 34.7 30.0 - 36.0 g/dL   RDW 13.2 11.5 - 15.5 %   Platelets 237 150 - 400 K/uL   nRBC 0.0 0.0 - 0.2 %       Pertinent labs & imaging results that were available during my care of the patient were reviewed by me and considered in my medical decision making.  Assessment & Plan:  Yoshimi was seen today for medical management of chronic issues and hypertension.  Diagnoses and all orders for this visit:  Essential hypertension BP fairly controlled. Changes were not made in regimen today. Goal BP is 140/80. Pt aware to report any persistent high or low readings. DASH diet and exercise encouraged. Exercise at least 150 minutes per week and increase as tolerated. Goal BMI > 25. Stress management encouraged. Avoid nicotine and tobacco product use. Avoid excessive alcohol and NSAID's. Avoid more than 2000 mg of sodium daily. Medications as prescribed. Follow up as scheduled.    Mixed hyperlipidemia Diet encouraged - increase intake of fresh fruits and vegetables, increase intake of lean proteins. Bake, broil, or grill  foods. Avoid fried, greasy, and fatty foods. Avoid fast foods. Increase intake of fiber-rich whole grains. Exercise encouraged - at least 150 minutes per week and advance as tolerated.  Goal BMI < 25. Continue medications as prescribed. Follow up in 3-6 months as discussed.   BMI 32.0-32.9,adult Diet and exercise encouraged. Likely to improve once chronic pain is under control and he can exercise more.   Bilateral foot-drop Scheduled to right repair with ortho next Wednesday.   Need for pneumococcal vaccination -     Pneumococcal conjugate vaccine 13-valent     Continue all other maintenance medications.  Follow up plan: Return in about 3 months (around 08/13/2019), or if symptoms worsen or fail to improve.  Continue healthy lifestyle choices, including diet (rich in fruits, vegetables, and lean proteins, and low in salt and simple carbohydrates) and exercise (at least 30 minutes of moderate physical activity daily).  Educational handout given for chronic pain, DASH  diet  The above assessment and management plan was discussed with the patient. The patient verbalized understanding of and has agreed to the management plan. Patient is aware to call the clinic if they develop any new symptoms or if symptoms persist or worsen. Patient is aware when to return to the clinic for a follow-up visit. Patient educated on when it is appropriate to go to the emergency department.   Monia Pouch, FNP-C [facility] [phone]

## 2019-05-17 ENCOUNTER — Other Ambulatory Visit (HOSPITAL_COMMUNITY)
Admission: RE | Admit: 2019-05-17 | Discharge: 2019-05-17 | Disposition: A | Discharge status: Home / Self Care | Payer: Medicare Other | Source: Ambulatory Visit | Attending: Orthopedic Surgery | Admitting: Orthopedic Surgery

## 2019-05-17 DIAGNOSIS — Z20822 Contact with and (suspected) exposure to covid-19: Secondary | ICD-10-CM | POA: Insufficient documentation

## 2019-05-17 DIAGNOSIS — Z01812 Encounter for preprocedural laboratory examination: Secondary | ICD-10-CM | POA: Diagnosis not present

## 2019-05-18 LAB — SARS CORONAVIRUS 2 (TAT 6-24 HRS): SARS Coronavirus 2: NEGATIVE

## 2019-05-20 NOTE — Progress Notes (Signed)
Pt aware to arrive [facility] admitting at 11:50am Wed 05/21/2019.

## 2019-05-20 NOTE — Anesthesia Preprocedure Evaluation (Addendum)
Anesthesia Evaluation  Patient identified by MRN, date of birth, ID band Patient awake    Reviewed: Allergy & Precautions, NPO status , Patient's Chart, lab work & pertinent test results  Airway Mallampati: II  TM Distance: >3 FB Neck ROM: Full    Dental no notable dental hx. (+) Edentulous Upper, Poor Dentition, Chipped,    Pulmonary neg pulmonary ROS, Current Smoker and Patient abstained from smoking.,    Pulmonary exam normal breath sounds clear to auscultation       Cardiovascular hypertension, Pt. on medications Normal cardiovascular exam Rhythm:Regular Rate:Normal     Neuro/Psych negative neurological ROS  negative psych ROS   GI/Hepatic   Endo/Other  negative endocrine ROS  Renal/GU Renal diseasenephroliothiasis K+ 4.3 Cr 1.00     Musculoskeletal negative musculoskeletal ROS (+)   Abdominal (+) + obese,   Peds  Hematology negative hematology ROS (+) Hgb 14.6   Anesthesia Other Findings   Reproductive/Obstetrics                            Anesthesia Physical Anesthesia Plan  ASA: II  Anesthesia Plan: General   Post-op Pain Management:    Induction: Intravenous  PONV Risk Score and Plan: 0 and 1 and Treatment may vary due to age or medical condition, Ondansetron and Dexamethasone  Airway Management Planned: LMA  Additional Equipment: None  Intra-op Plan:   Post-operative Plan:   Informed Consent: I have reviewed the patients History and Physical, chart, labs and discussed the procedure including the risks, benefits and alternatives for the proposed anesthesia with the patient or authorized representative who has indicated his/her understanding and acceptance.     Dental advisory given  Plan Discussed with:   Anesthesia Plan Comments:        Anesthesia Quick Evaluation

## 2019-05-21 ENCOUNTER — Encounter (HOSPITAL_COMMUNITY)
Admission: RE | Disposition: A | Discharge status: Home / Self Care | Payer: Self-pay | Source: Other Acute Inpatient Hospital | Attending: Orthopedic Surgery

## 2019-05-21 ENCOUNTER — Ambulatory Visit (HOSPITAL_COMMUNITY): Payer: Medicare Other | Admitting: Anesthesiology

## 2019-05-21 ENCOUNTER — Ambulatory Visit (HOSPITAL_COMMUNITY): Payer: Medicare Other | Admitting: Physician Assistant

## 2019-05-21 ENCOUNTER — Encounter (HOSPITAL_COMMUNITY): Payer: Self-pay | Admitting: Orthopedic Surgery

## 2019-05-21 ENCOUNTER — Ambulatory Visit (HOSPITAL_COMMUNITY)
Admission: RE | Admit: 2019-05-21 | Discharge: 2019-05-21 | Disposition: A | Discharge status: Home / Self Care | Payer: Medicare Other | Source: Other Acute Inpatient Hospital | Attending: Orthopedic Surgery | Admitting: Orthopedic Surgery

## 2019-05-21 DIAGNOSIS — Z9114 Patient's other noncompliance with medication regimen: Secondary | ICD-10-CM | POA: Diagnosis not present

## 2019-05-21 DIAGNOSIS — I4891 Unspecified atrial fibrillation: Secondary | ICD-10-CM | POA: Diagnosis not present

## 2019-05-21 DIAGNOSIS — I10 Essential (primary) hypertension: Secondary | ICD-10-CM | POA: Diagnosis not present

## 2019-05-21 DIAGNOSIS — M21371 Foot drop, right foot: Secondary | ICD-10-CM | POA: Diagnosis present

## 2019-05-21 DIAGNOSIS — M48061 Spinal stenosis, lumbar region without neurogenic claudication: Secondary | ICD-10-CM | POA: Insufficient documentation

## 2019-05-21 DIAGNOSIS — Z905 Acquired absence of kidney: Secondary | ICD-10-CM | POA: Insufficient documentation

## 2019-05-21 DIAGNOSIS — E669 Obesity, unspecified: Secondary | ICD-10-CM | POA: Insufficient documentation

## 2019-05-21 DIAGNOSIS — Z7982 Long term (current) use of aspirin: Secondary | ICD-10-CM | POA: Diagnosis not present

## 2019-05-21 DIAGNOSIS — F17210 Nicotine dependence, cigarettes, uncomplicated: Secondary | ICD-10-CM | POA: Insufficient documentation

## 2019-05-21 DIAGNOSIS — G5731 Lesion of lateral popliteal nerve, right lower limb: Secondary | ICD-10-CM | POA: Diagnosis not present

## 2019-05-21 DIAGNOSIS — M21372 Foot drop, left foot: Secondary | ICD-10-CM | POA: Diagnosis not present

## 2019-05-21 DIAGNOSIS — Z79899 Other long term (current) drug therapy: Secondary | ICD-10-CM | POA: Diagnosis not present

## 2019-05-21 DIAGNOSIS — Z87442 Personal history of urinary calculi: Secondary | ICD-10-CM | POA: Diagnosis not present

## 2019-05-21 DIAGNOSIS — Z6832 Body mass index (BMI) 32.0-32.9, adult: Secondary | ICD-10-CM | POA: Insufficient documentation

## 2019-05-21 DIAGNOSIS — G629 Polyneuropathy, unspecified: Secondary | ICD-10-CM | POA: Diagnosis not present

## 2019-05-21 HISTORY — PX: SUPERFICIAL PERONEAL NERVE RELEASE: SHX6200

## 2019-05-21 SURGERY — DECOMPRESSION, NERVE, SUPERFICIAL PERONEAL
Anesthesia: General | Site: Knee | Laterality: Right

## 2019-05-21 MED ORDER — LABETALOL HCL 5 MG/ML IV SOLN
INTRAVENOUS | Status: AC
  Filled 2019-05-21: qty 4

## 2019-05-21 MED ORDER — EPHEDRINE 5 MG/ML INJ
INTRAVENOUS | Status: AC
  Filled 2019-05-21: qty 10

## 2019-05-21 MED ORDER — ONDANSETRON HCL 4 MG/2ML IJ SOLN
INTRAMUSCULAR | Status: AC
  Filled 2019-05-21: qty 2

## 2019-05-21 MED ORDER — EPHEDRINE SULFATE-NACL 50-0.9 MG/10ML-% IV SOSY
PREFILLED_SYRINGE | INTRAVENOUS | Status: DC | PRN
  Administered 2019-05-21 (×3): 10 mg via INTRAVENOUS

## 2019-05-21 MED ORDER — ONDANSETRON HCL 4 MG/2ML IJ SOLN
INTRAMUSCULAR | Status: DC | PRN
  Administered 2019-05-21: 4 mg via INTRAVENOUS

## 2019-05-21 MED ORDER — ACETAMINOPHEN 10 MG/ML IV SOLN: 1000.0000 mg | Freq: Once | INTRAVENOUS | Status: DC | PRN

## 2019-05-21 MED ORDER — FENTANYL CITRATE (PF) 100 MCG/2ML IJ SOLN
INTRAMUSCULAR | Status: AC
  Filled 2019-05-21: qty 2

## 2019-05-21 MED ORDER — POVIDONE-IODINE 10 % EX SWAB
2.0000 "application " | Freq: Once | CUTANEOUS | Status: AC
  Administered 2019-05-21: 2 via TOPICAL

## 2019-05-21 MED ORDER — PROPOFOL 10 MG/ML IV BOLUS
INTRAVENOUS | Status: AC
  Filled 2019-05-21: qty 20

## 2019-05-21 MED ORDER — HYDROCODONE-ACETAMINOPHEN 5-325 MG PO TABS: 1.0000 | ORAL_TABLET | Freq: Four times a day (QID) | ORAL | 0 refills | Status: DC | PRN

## 2019-05-21 MED ORDER — DEXAMETHASONE SODIUM PHOSPHATE 10 MG/ML IJ SOLN
INTRAMUSCULAR | Status: DC | PRN
  Administered 2019-05-21: 10 mg via INTRAVENOUS

## 2019-05-21 MED ORDER — FENTANYL CITRATE (PF) 100 MCG/2ML IJ SOLN
INTRAMUSCULAR | Status: DC | PRN
  Administered 2019-05-21 (×4): 25 ug via INTRAVENOUS

## 2019-05-21 MED ORDER — CEFAZOLIN SODIUM-DEXTROSE 2-4 GM/100ML-% IV SOLN
2.0000 g | INTRAVENOUS | Status: AC
  Administered 2019-05-21: 2 g via INTRAVENOUS
  Filled 2019-05-21: qty 100

## 2019-05-21 MED ORDER — DEXMEDETOMIDINE HCL 200 MCG/2ML IV SOLN
INTRAVENOUS | Status: DC | PRN
  Administered 2019-05-21: 8 ug via INTRAVENOUS
  Administered 2019-05-21 (×3): 4 ug via INTRAVENOUS

## 2019-05-21 MED ORDER — LABETALOL HCL 5 MG/ML IV SOLN
5.0000 mg | INTRAVENOUS | Status: DC | PRN
  Administered 2019-05-21: 5 mg via INTRAVENOUS

## 2019-05-21 MED ORDER — DEXAMETHASONE SODIUM PHOSPHATE 10 MG/ML IJ SOLN
INTRAMUSCULAR | Status: AC
  Filled 2019-05-21: qty 1

## 2019-05-21 MED ORDER — DEXMEDETOMIDINE HCL IN NACL 200 MCG/50ML IV SOLN
INTRAVENOUS | Status: AC
  Filled 2019-05-21: qty 50

## 2019-05-21 MED ORDER — DEXAMETHASONE SODIUM PHOSPHATE 10 MG/ML IJ SOLN: 8.0000 mg | Freq: Once | INTRAMUSCULAR | Status: DC

## 2019-05-21 MED ORDER — ACETAMINOPHEN 10 MG/ML IV SOLN
1000.0000 mg | Freq: Four times a day (QID) | INTRAVENOUS | Status: DC
  Administered 2019-05-21: 1000 mg via INTRAVENOUS
  Filled 2019-05-21: qty 100

## 2019-05-21 MED ORDER — ONDANSETRON HCL 4 MG/2ML IJ SOLN: 4.0000 mg | Freq: Once | INTRAMUSCULAR | Status: DC | PRN

## 2019-05-21 MED ORDER — LIDOCAINE 2% (20 MG/ML) 5 ML SYRINGE
INTRAMUSCULAR | Status: DC | PRN
  Administered 2019-05-21: 100 mg via INTRAVENOUS

## 2019-05-21 MED ORDER — PROPOFOL 10 MG/ML IV BOLUS
INTRAVENOUS | Status: DC | PRN
  Administered 2019-05-21: 130 mg via INTRAVENOUS

## 2019-05-21 MED ORDER — LACTATED RINGERS IV SOLN: INTRAVENOUS | Status: DC

## 2019-05-21 MED ORDER — METHOCARBAMOL 500 MG PO TABS: 500.0000 mg | ORAL_TABLET | Freq: Four times a day (QID) | ORAL | Status: DC | PRN

## 2019-05-21 MED ORDER — LIDOCAINE 2% (20 MG/ML) 5 ML SYRINGE
INTRAMUSCULAR | Status: AC
  Filled 2019-05-21: qty 5

## 2019-05-21 MED ORDER — SODIUM CHLORIDE 0.9 % IR SOLN
Status: DC | PRN
  Administered 2019-05-21: 1000 mL

## 2019-05-21 MED ORDER — SODIUM CHLORIDE 0.9 % IV SOLN: INTRAVENOUS | Status: DC

## 2019-05-21 MED ORDER — CHLORHEXIDINE GLUCONATE 4 % EX LIQD: 60.0000 mL | Freq: Once | CUTANEOUS | Status: DC

## 2019-05-21 MED ORDER — FENTANYL CITRATE (PF) 100 MCG/2ML IJ SOLN
25.0000 ug | INTRAMUSCULAR | Status: DC | PRN
  Administered 2019-05-21 (×3): 50 ug via INTRAVENOUS

## 2019-05-21 MED ORDER — METHOCARBAMOL 500 MG IVPB - SIMPLE MED: 500.0000 mg | Freq: Four times a day (QID) | INTRAVENOUS | Status: DC | PRN

## 2019-05-21 SURGICAL SUPPLY — 45 items
BANDAGE ESMARK 6X9 LF (GAUZE/BANDAGES/DRESSINGS) ×1 IMPLANT
BNDG COHESIVE 6X5 TAN STRL LF (GAUZE/BANDAGES/DRESSINGS) ×2 IMPLANT
BNDG ELASTIC 6X5.8 VLCR STR LF (GAUZE/BANDAGES/DRESSINGS) ×2 IMPLANT
BNDG ESMARK 6X9 LF (GAUZE/BANDAGES/DRESSINGS) ×2
CLSR STERI-STRIP ANTIMIC 1/2X4 (GAUZE/BANDAGES/DRESSINGS) ×2 IMPLANT
COVER WAND RF STERILE (DRAPES) IMPLANT
CUFF TOURN SGL QUICK 24 (TOURNIQUET CUFF)
CUFF TOURN SGL QUICK 34 (TOURNIQUET CUFF) ×1
CUFF TRNQT CYL 24X4X16.5-23 (TOURNIQUET CUFF) IMPLANT
CUFF TRNQT CYL 34X4.125X (TOURNIQUET CUFF) ×1 IMPLANT
DRAPE U-SHAPE 47X51 STRL (DRAPES) ×2 IMPLANT
DRESSING AQUACEL AG SP 3.5X6 (GAUZE/BANDAGES/DRESSINGS) ×1 IMPLANT
DRSG AQUACEL AG SP 3.5X6 (GAUZE/BANDAGES/DRESSINGS) ×2
DRSG EMULSION OIL 3X3 NADH (GAUZE/BANDAGES/DRESSINGS) IMPLANT
DURAPREP 26ML APPLICATOR (WOUND CARE) ×2 IMPLANT
ELECT REM PT RETURN 15FT ADLT (MISCELLANEOUS) ×2 IMPLANT
GAUZE SPONGE 4X4 12PLY STRL (GAUZE/BANDAGES/DRESSINGS) IMPLANT
GLOVE BIO SURGEON STRL SZ7 (GLOVE) ×2 IMPLANT
GLOVE BIO SURGEON STRL SZ8 (GLOVE) ×4 IMPLANT
GLOVE BIOGEL PI IND STRL 6.5 (GLOVE) IMPLANT
GLOVE BIOGEL PI IND STRL 7.5 (GLOVE) ×1 IMPLANT
GLOVE BIOGEL PI IND STRL 8 (GLOVE) ×2 IMPLANT
GLOVE BIOGEL PI INDICATOR 6.5 (GLOVE)
GLOVE BIOGEL PI INDICATOR 7.5 (GLOVE) ×1
GLOVE BIOGEL PI INDICATOR 8 (GLOVE) ×2
GLOVE SURG SS PI 6.5 STRL IVOR (GLOVE) IMPLANT
GOWN STRL REUS W/ TWL LRG LVL3 (GOWN DISPOSABLE) ×2 IMPLANT
GOWN STRL REUS W/ TWL XL LVL3 (GOWN DISPOSABLE) ×1 IMPLANT
GOWN STRL REUS W/TWL LRG LVL3 (GOWN DISPOSABLE) ×2
GOWN STRL REUS W/TWL XL LVL3 (GOWN DISPOSABLE) ×1
KIT BASIN OR (CUSTOM PROCEDURE TRAY) ×2 IMPLANT
KIT TURNOVER KIT A (KITS) IMPLANT
NS IRRIG 1000ML POUR BTL (IV SOLUTION) ×2 IMPLANT
PACK TOTAL KNEE CUSTOM (KITS) ×2 IMPLANT
PAD CAST 4YDX4 CTTN HI CHSV (CAST SUPPLIES) ×1 IMPLANT
PADDING CAST COTTON 4X4 STRL (CAST SUPPLIES) ×1
PADDING CAST COTTON 6X4 STRL (CAST SUPPLIES) ×2 IMPLANT
PENCIL SMOKE EVACUATOR (MISCELLANEOUS) IMPLANT
STOCKINETTE 8 INCH (MISCELLANEOUS) ×2 IMPLANT
STRIP CLOSURE SKIN 1/2X4 (GAUZE/BANDAGES/DRESSINGS) IMPLANT
SUT MNCRL AB 4-0 PS2 18 (SUTURE) ×2 IMPLANT
SUT VIC AB 2-0 CT1 27 (SUTURE) ×2
SUT VIC AB 2-0 CT1 TAPERPNT 27 (SUTURE) ×2 IMPLANT
TOWEL OR 17X26 10 PK STRL BLUE (TOWEL DISPOSABLE) ×4 IMPLANT
YANKAUER SUCT BULB TIP 10FT TU (MISCELLANEOUS) ×2 IMPLANT

## 2019-05-21 NOTE — Transfer of Care (Signed)
Immediate Anesthesia Transfer of Care Note  Patient: Jesse Barton.  Procedure(s) Performed: Right peroneal nerve decompression (Right Knee)  Patient Location: PACU  Anesthesia Type:General  Level of Consciousness: awake and oriented  Airway & Oxygen Therapy: Patient Spontanous Breathing and Patient connected to face mask oxygen  Post-op Assessment: Report given to RN and Post -op Vital signs reviewed and stable  Post vital signs: Reviewed and stable  Last Vitals:  Vitals Value Taken Time  BP 156/81 05/21/19 1505  Temp    Pulse 90 05/21/19 1505  Resp 13 05/21/19 1505  SpO2 100 % 05/21/19 1505  Vitals shown include unvalidated device data.  Last Pain:  Vitals:   05/21/19 1153  TempSrc: Oral  PainSc:          Complications: No apparent anesthesia complications

## 2019-05-21 NOTE — H&P (Signed)
Jesse Barton. is an 79 y.o. male.   Chief Complaint: Right foot drop HPI: Jesse Barton is a 79 yo male who has a several month history of right foot weakness and numbness. He has difficulty ambulating secondary to a foot drop which makes him more likely to stumble and lose balance. He did not have any injury leading to this. He has lumbar problems also but not an acute disc herniation. He had a nerve conduction study showing peroneal nerve compression at the fibular head. He presents for peroneal nerve decompression. He understands that this may not restore all of his function given that there is also lumbar pathology which may be playing a role in his lower extremity symptoms  Past Medical History:  Diagnosis Date  . Arthritis   . Cancer ([place])    Kidney  . History of kidney stones   . Hypertension   . Kidney stone   . Sciatic nerve pain     Past Surgical History:  Procedure Laterality Date  . CATARACT EXTRACTION    . CYSTOSCOPY/URETEROSCOPY/HOLMIUM LASER/STENT PLACEMENT Right 11/19/2018   Procedure: CYSTOSCOPY/RETROGRADE/URETEROSCOPY/HOLMIUM LASER/STENT PLACEMENT;  Surgeon: Festus Aloe, MD;  Location: [facility];  Service: Urology;  Laterality: Right;  . CYSTOSCOPY/URETEROSCOPY/HOLMIUM LASER/STENT PLACEMENT Right 12/17/2018   Procedure: CYSTOSCOP RIGHT  /URETEROSCOPY/HOLMIUM LASER/STENT EXCHANGE;  Surgeon: Festus Aloe, MD;  Location: [facility];  Service: Urology;  Laterality: Right;  . EYE SURGERY Bilateral    cataracts  . KIDNEY SURGERY    . NEPHRECTOMY Left     Family History  Problem Relation Age of Onset  . Cancer Mother   . Leukemia Father   . Schizophrenia Sister   . Cancer Sister 65       kidney   Social History:  reports that he has been smoking cigars. He has never used smokeless tobacco. He reports current alcohol use. He reports current drug use. Drug: Marijuana.  Allergies:  Allergies  Allergen Reactions  . Ciprofloxacin Other (See Comments)     Increased lower extremity weakness  . Lipitor [Atorvastatin Calcium]     Generic lipitor caused joint pain  . Tamiflu [Oseltamivir]     Extreme weakness and joint instability     No medications prior to admission.    No results found for this or any previous visit (from the past 48 hour(s)). No results found.  Review of Systems  There were no vitals taken for this visit. Physical Exam Physical Examination: General appearance - alert, well appearing, and in no distress Mental status - alert, oriented to person, place, and time Chest - clear to auscultation, no wheezes, rales or rhonchi, symmetric air entry Heart - normal rate, regular rhythm, normal S1, S2, no murmurs, rubs, clicks or gallops Abdomen - soft, nontender, nondistended, no masses or organomegaly Neurological - alert, oriented, normal speech, no focal findings or movement disorder noted Right lower extremity- significant weakness in attempts at dorsiflexion of the foot and toes and eversion of the foot. Sensation diminished in first dorsal web space; pulses intact   Assessment/Plan Right peroneal nerve palsy- Plan right peroneal nerve decompression. Discussed in detail with patient who elects to proceed.  Gaynelle Arabian, MD 05/21/2019, 7:48 AM

## 2019-05-21 NOTE — Anesthesia Postprocedure Evaluation (Signed)
Anesthesia Post Note  Patient: Jesse Barton.  Procedure(s) Performed: Right peroneal nerve decompression (Right Knee)     Patient location during evaluation: PACU Anesthesia Type: General Level of consciousness: awake and alert Pain management: pain level controlled Vital Signs Assessment: post-procedure vital signs reviewed and stable Respiratory status: spontaneous breathing, nonlabored ventilation, respiratory function stable and patient connected to nasal cannula oxygen Cardiovascular status: blood pressure returned to baseline and stable Postop Assessment: no apparent nausea or vomiting Anesthetic complications: no    Last Vitals:  Vitals:   05/21/19 1630 05/21/19 1645  BP:  (!) 142/97  Pulse: (!) 123 91  Resp: 17 18  Temp:  36.8 C  SpO2: 94% 95%    Last Pain:  Vitals:   05/21/19 1645  TempSrc:   PainSc: 7                  Barnet Glasgow

## 2019-05-21 NOTE — Interval H&P Note (Signed)
History and Physical Interval Note:  05/21/2019 1:04 PM  Jesse Barton.  has presented today for surgery, with the diagnosis of Right peroneal nerve compressive neuropathy.  The various methods of treatment have been discussed with the patient and family. After consideration of risks, benefits and other options for treatment, the patient has consented to  Procedure(s) with comments: Right peroneal nerve decompression (Right) - 21min as a surgical intervention.  The patient's history has been reviewed, patient examined, no change in status, stable for surgery.  I have reviewed the patient's chart and labs.  Questions were answered to the patient's satisfaction.     Pilar Plate Reinhard Schack

## 2019-05-21 NOTE — Anesthesia Procedure Notes (Signed)
Procedure Name: LMA Insertion Date/Time: 05/21/2019 1:54 PM Performed by: Sharlette Dense, CRNA Patient Re-evaluated:Patient Re-evaluated prior to induction Oxygen Delivery Method: Circle system utilized Preoxygenation: Pre-oxygenation with 100% oxygen Induction Type: IV induction LMA: LMA with gastric port inserted LMA Size: 5.0 Placement Confirmation: positive ETCO2 and breath sounds checked- equal and bilateral Tube secured with: Tape Dental Injury: Teeth and Oropharynx as per pre-operative assessment

## 2019-05-21 NOTE — Discharge Instructions (Addendum)
Jesse Arabian, MD [facility] [address], [address] [place], [place] [address] [phone]   POSTOPERATIVE DIRECTIONS  HOME CARE INSTRUCTIONS  . Remove items at home which could result in a fall. This includes throw rugs or furniture in walking pathways.  . ICE to the affected knee as much as tolerated. Icing helps control swelling. If the swelling is well controlled you will be more comfortable and rehab easier. Continue to use ice on the knee for pain and swelling from surgery. You may notice swelling that will progress down to the foot and ankle. This is normal after surgery. Elevate the leg when you are not up walking on it.    . Continue to use the breathing machine which will help keep your temperature down. It is common for your temperature to cycle up and down following surgery, especially at night when you are not up moving around and exerting yourself. The breathing machine keeps your lungs expanded and your temperature down.  DIET You may resume your previous home diet once you are discharged from the hospital.  DRESSING / WOUND CARE / SHOWERING . Remove the bulky dressing 2 days following surgery, this will include an ACE wrap and rolled gauze. Leave the tape strips across the incision in place until your first follow-up appointment. Cover the incision with 4x4 gauze and paper tape. Change the dressing daily with new gauze and paper tape for 7-10 days following surgery. . You may begin showering 3 days following surgery, but do not submerge the incision under water. . Allow water and soap to run over the incision, pat dry, and apply a new gauze dressing  ACTIVITY . Increase activity as tolerated . Avoid compression of the back of the right leg. For example, do not cross your legs.   TED HOSE STOCKINGS Wear the elastic stockings on both legs for three weeks following surgery during the day. You may remove them at night for  sleeping.  WEIGHT BEARING Weight bearing as tolerated with assist device (walker, cane, etc) as directed, use it as long as suggested by your surgeon or therapist, typically at least 4-6 weeks.  POSTOPERATIVE CONSTIPATION PROTOCOL Constipation - defined medically as fewer than three stools per week and severe constipation as less than one stool per week.  One of the most common issues patients have following surgery is constipation.  Even if you have a regular bowel pattern at home, your normal regimen is likely to be disrupted due to multiple reasons following surgery.  Combination of anesthesia, postoperative narcotics, change in appetite and fluid intake all can affect your bowels.  In order to avoid complications following surgery, here are some recommendations in order to help you during your recovery period.  . Colace (docusate) - Pick up an over-the-counter form of Colace or another stool softener and take twice a day as long as you are requiring postoperative pain medications.  Take with a full glass of water daily.  If you experience loose stools or diarrhea, hold the colace until you stool forms back up. If your symptoms do not get better within 1 week or if they get worse, check with your doctor. . Dulcolax (bisacodyl) - Pick up over-the-counter and take as directed by the product packaging as needed to assist with the movement of your bowels.  Take with a full glass of water.  Use this product as needed if not relieved by Colace only.  . MiraLax (polyethylene glycol) - Pick  up over-the-counter to have on hand. MiraLax is a solution that will increase the amount of water in your bowels to assist with bowel movements.  Take as directed and can mix with a glass of water, juice, soda, coffee, or tea. Take if you go more than two days without a movement. Do not use MiraLax more than once per day. Call your doctor if you are still constipated or irregular after using this medication for 7 days in a  row.  If you continue to have problems with postoperative constipation, please contact the office for further assistance and recommendations.  If you experience "the worst abdominal pain ever" or develop nausea or vomiting, please contact the office immediatly for further recommendations for treatment.  ITCHING If you experience itching with your medications, try taking only a single pain pill, or even half a pain pill at a time.  You can also use Benadryl over the counter for itching or also to help with sleep.   MEDICATIONS See your medication summary on the "After Visit Summary" that the nursing staff will review with you prior to discharge.  You may have some home medications which will be placed on hold until you complete the course of blood thinner medication.  It is important for you to complete the blood thinner medication as prescribed by your surgeon.  Continue your approved medications as instructed at time of discharge.  PRECAUTIONS . If you experience chest pain or shortness of breath - call 911 immediately for transfer to the hospital emergency department.  . If you develop a fever greater that 101 F, purulent drainage from wound, increased redness or drainage from wound, foul odor from the wound/dressing, or calf pain - CONTACT YOUR SURGEON.                                                   FOLLOW-UP APPOINTMENTS Make sure you keep all of your appointments after your operation with your surgeon and caregivers. You should call the office at the above phone number and make an appointment for approximately two weeks after the date of your surgery or on the date instructed by your surgeon outlined in the "After Visit Summary".  MAKE SURE YOU:  . Understand these instructions.  . Get help right away if you are not doing well or get worse.    Pick up stool softner and laxative for home use following surgery while on pain medications. Do not submerge incision under water. Please use  good hand washing techniques while changing dressing each day. May shower starting three days after surgery. Please use a clean towel to pat the incision dry following showers. Continue to use ice for pain and swelling after surgery. Do not use any lotions or creams on the incision until instructed by your surgeon.

## 2019-05-21 NOTE — Brief Op Note (Signed)
05/21/2019  2:40 PM  PATIENT:  Jesse Barton.  79 y.o. male  PRE-OPERATIVE DIAGNOSIS:  Right peroneal nerve compressive neuropathy  POST-OPERATIVE DIAGNOSIS:  Right peroneal nerve compressive neuropathy  PROCEDURE:  Procedure(s) with comments: Right peroneal nerve decompression (Right) - 79min  SURGEON:  Surgeon(s) and Role:    Gaynelle Arabian, MD - Primary  PHYSICIAN ASSISTANT:   ASSISTANTS: Theresa Duty, PA-C   ANESTHESIA:   general  EBL:  25 mL   BLOOD ADMINISTERED:none  LOCAL MEDICATIONS USED:  NONE  COUNTS:  YES  TOURNIQUET:   Total Tourniquet Time Documented: Thigh (laterality) - 14 minutes Total: Thigh (laterality) - 14 minutes   DICTATION: .Other Dictation: Dictation Number [id number]  PLAN OF CARE: Discharge to home after PACU  PATIENT DISPOSITION:  PACU - hemodynamically stable.

## 2019-05-21 NOTE — Op Note (Signed)
NAME: Jesse Barton, Jesse Barton MEDICAL RECORD [id number] ACCOUNT [id number] DATE OF BIRTH:1940-08-03 FACILITY: [facility] LOCATION: [facility] PHYSICIAN:Andrya Roppolo Zella Ball, MD  OPERATIVE REPORT  DATE OF PROCEDURE:  05/21/2019  PREOPERATIVE DIAGNOSIS:  Right peroneal nerve compressive neuropathy.  POSTOPERATIVE DIAGNOSIS:  Right peroneal nerve compressive neuropathy.  PROCEDURE:  Right peroneal nerve decompression.  SURGEON:  Gaynelle Arabian, MD  ASSISTANT:  Theresa Duty, PA-C  ANESTHESIA:  General.  ESTIMATED BLOOD LOSS:  Minimal.  DRAINS:  None.  TOURNIQUET TIME:  13 minutes at 300 mmHg.  COMPLICATIONS:  None.  CONDITION:  Stable to recovery.  BRIEF CLINICAL NOTE:  The patient is a 79 year old male who has a right peroneal nerve palsy.  He has a foot drop and is having significant difficulties with that.  He has been worked up by the spine specialists and noted to have some lower lumbar  issues, but had a nerve conduction study which showed significant peroneal nerve decompression at the fibular head.  He presents now for a peroneal nerve decompression.  PROCEDURE IN DETAIL:  After successful administration of general anesthetic, the patient was placed in the left lateral decubitus position with the right side up and held with a hip positioner.  The right lower extremity was then prepped and draped in  the usual sterile fashion.  An incision line was drawn just posterior to the lateral hamstring, about 4 inches in length coursing to the fibular head.  Skin was cut with a 10 blade through subcutaneous tissue to the fascia lata.  The posterior border of  the fascia lata was incised with a Metzenbaum scissor and dissection was performed.  The nerve was identified and proximally as it exited the thigh to go lateral to the knee, the dissection was started at that point.  There was a lot of fatty tissue  overlying the nerve.  A careful dissection was performed to separate this tissue from the  nerve tissue, effectively decompressing that area.  There was more compression and flattening as we got closer to the fibular head region.  Careful dissection was  performed there to avoid damaging the nerve.  The nerve was then traced into the leg as it coursed around the fibular neck into the anterior compartment.  The Freer elevator was passed alongside the nerve to show that there was a free motion and the  nerve and there was no compression at that level.  Prior to the dissection, there was a significant compression and flattening there.  I inspected the nerve from the proximal part of the incision down distally and there was that small area of flattening  near the fibular head and neck area, but other than that, once the dissection was performed, the nerve appeared healthy.  Tourniquet was then released, total time of 13 minutes.  The tissues pinked up extremely well.  The wound was then irrigated with  saline solution.  The subcutaneous was closed in 2 layers, deep and superficial with interrupted 2-0 Vicryl, subcuticular running 4-0 Monocryl.  Incisions cleaned and dried and Steri-Strips and a bulky sterile dressing applied.  He was then awakened and  transported to recovery in stable condition.  VN/NUANCE  D:05/21/2019 T:05/21/2019 JOB:[id number]

## 2019-05-21 NOTE — Progress Notes (Signed)
D; new onset A-fib noted. Notified Dr. Valma Cava, 5mg  IV labatalol given, HR down burt remains a-fib.  12lead EKG done, gave copy to Pt to go to primary MD with it and evaluate. Otherwise tolerated pain well. tx to P2. Use BR with walker.

## 2019-06-23 ENCOUNTER — Ambulatory Visit: Payer: Medicare Other | Admitting: Neurology

## 2019-06-27 ENCOUNTER — Encounter: Payer: Self-pay | Admitting: *Deleted

## 2019-07-03 DIAGNOSIS — M48062 Spinal stenosis, lumbar region with neurogenic claudication: Secondary | ICD-10-CM | POA: Diagnosis not present

## 2019-07-03 DIAGNOSIS — M545 Low back pain: Secondary | ICD-10-CM | POA: Diagnosis not present

## 2019-07-03 DIAGNOSIS — G5790 Unspecified mononeuropathy of unspecified lower limb: Secondary | ICD-10-CM | POA: Diagnosis not present

## 2019-07-28 ENCOUNTER — Other Ambulatory Visit (HOSPITAL_COMMUNITY): Payer: Self-pay | Admitting: Specialist

## 2019-07-28 ENCOUNTER — Other Ambulatory Visit: Payer: Self-pay | Admitting: Specialist

## 2019-07-28 DIAGNOSIS — M545 Low back pain, unspecified: Secondary | ICD-10-CM

## 2019-08-11 DIAGNOSIS — G5731 Lesion of lateral popliteal nerve, right lower limb: Secondary | ICD-10-CM | POA: Insufficient documentation

## 2019-08-14 ENCOUNTER — Ambulatory Visit: Payer: Medicare Other | Admitting: Family Medicine

## 2019-08-14 ENCOUNTER — Encounter: Payer: Self-pay | Admitting: Family Medicine

## 2019-08-14 ENCOUNTER — Ambulatory Visit (INDEPENDENT_AMBULATORY_CARE_PROVIDER_SITE_OTHER): Payer: Medicare Other | Admitting: Family Medicine

## 2019-08-14 ENCOUNTER — Other Ambulatory Visit: Payer: Self-pay

## 2019-08-14 VITALS — BP 132/74 | HR 80 | Temp 97.1°F | Resp 20 | Ht 70.0 in | Wt 227.2 lb

## 2019-08-14 DIAGNOSIS — I10 Essential (primary) hypertension: Secondary | ICD-10-CM | POA: Diagnosis not present

## 2019-08-14 DIAGNOSIS — I4891 Unspecified atrial fibrillation: Secondary | ICD-10-CM | POA: Diagnosis not present

## 2019-08-14 DIAGNOSIS — M48061 Spinal stenosis, lumbar region without neurogenic claudication: Secondary | ICD-10-CM

## 2019-08-14 DIAGNOSIS — E782 Mixed hyperlipidemia: Secondary | ICD-10-CM | POA: Diagnosis not present

## 2019-08-14 MED ORDER — METOPROLOL SUCCINATE ER 50 MG PO TB24: 50.0000 mg | ORAL_TABLET | Freq: Every day | ORAL | 2 refills | Status: DC

## 2019-08-14 NOTE — Progress Notes (Signed)
Subjective:  Patient ID: Jesse Mane., male    DOB: 05-Nov-1940  Age: 79 y.o. MRN: [id number]  CC: Medical Management of Chronic Issues   HPI Jesse Atkerson. presents for follow-up of hypertension. Patient has no history of headache chest pain or shortness of breath or recent cough. Patient also denies symptoms of TIA such as numbness weakness lateralizing. Patient checks  blood pressure at home and has not had any elevated readings recently. Patient denies side effects from his medication. States taking it regularly. Patient in for follow-up of elevated cholesterol. Doing well without complaints on current medication. Denies side effects of statin including myalgia and arthralgia and nausea. Also in today for liver function testing. Currently no chest pain, shortness of breath or other cardiovascular related symptoms noted.  Patient also has right knee surgery 2 months ago.  And lumbar spinal stenosis as well.  He requests a tall walker because he cannot flex his back.  He is too weak for a discectomy.  Patient had a left nephrectomy in 2011 due to renal carcinoma.  Depression screen [facility] 2/9 08/14/2019 05/16/2019 02/06/2019  Decreased Interest 0 0 0  Down, Depressed, Hopeless 0 0 0  PHQ - 2 Score 0 0 0    History Jesse Barton has a past medical history of Arthritis, Cancer ([place]), History of kidney stones, Hypertension, Kidney stone, and Sciatic nerve pain.   He has a past surgical history that includes Kidney surgery; Eye surgery (Bilateral); Nephrectomy (Left); Cataract extraction; Cystoscopy/ureteroscopy/holmium laser/stent placement (Right, 11/19/2018); Cystoscopy/ureteroscopy/holmium laser/stent placement (Right, 12/17/2018); and Superficial peroneal nerve release (Right, 05/21/2019).   His family history includes Cancer in his mother; Cancer (age of onset: 33) in his sister; Leukemia in his father; Schizophrenia in his sister.He reports that he has been smoking cigars. He has never used  smokeless tobacco. He reports current alcohol use. He reports current drug use. Drug: Marijuana.    ROS Review of Systems  Constitutional: Negative.   HENT: Negative.   Eyes: Negative for visual disturbance.  Respiratory: Negative for cough and shortness of breath.   Cardiovascular: Negative for chest pain and leg swelling.  Gastrointestinal: Negative for abdominal pain, diarrhea, nausea and vomiting.  Genitourinary: Negative for difficulty urinating.  Musculoskeletal: Positive for arthralgias and back pain. Negative for myalgias.  Skin: Negative for rash.  Neurological: Negative for headaches.  Psychiatric/Behavioral: Negative for sleep disturbance.    Objective:  BP 132/74   Pulse 80   Temp (!) 97.1 F (36.2 C) (Temporal)   Resp 20   Ht 5' 10" (1.778 m)   Wt 227 lb 4 oz (103.1 kg)   SpO2 98%   BMI 32.61 kg/m   BP Readings from Last 3 Encounters:  08/14/19 132/74  05/21/19 (!) 142/97  05/16/19 (!) 146/79    Wt Readings from Last 3 Encounters:  08/14/19 227 lb 4 oz (103.1 kg)  05/21/19 227 lb 1.2 oz (103 kg)  05/16/19 227 lb (103 kg)     Physical Exam Vitals reviewed.  Constitutional:      Appearance: He is well-developed.  HENT:     Head: Normocephalic and atraumatic.     Right Ear: External ear normal.     Left Ear: External ear normal.     Mouth/Throat:     Pharynx: No oropharyngeal exudate or posterior oropharyngeal erythema.  Eyes:     Pupils: Pupils are equal, round, and reactive to light.  Cardiovascular:     Rate and Rhythm: Normal rate.  Rhythm irregularly irregular. Frequent extrasystoles are present.    Heart sounds: No murmur.  Pulmonary:     Effort: No respiratory distress.     Breath sounds: Normal breath sounds.  Musculoskeletal:     Cervical back: Normal range of motion and neck supple.  Neurological:     Mental Status: He is alert and oriented to person, place, and time.       Assessment & Plan:   Jesse Barton was seen today for  medical management of chronic issues.  Diagnoses and all orders for this visit:  Essential hypertension -     CBC with Differential/Platelet -     CMP14+EGFR -     Lipid panel  Atrial fibrillation, unspecified type (HCC) -     EKG 12-Lead -     CBC with Differential/Platelet -     CMP14+EGFR -     Lipid panel -     Ambulatory referral to Cardiology  Mixed hyperlipidemia -     CBC with Differential/Platelet -     CMP14+EGFR -     Lipid panel  Spinal stenosis of lumbar region without neurogenic claudication  Other orders -     metoprolol succinate (TOPROL-XL) 50 MG 24 hr tablet; Take 1 tablet (50 mg total) by mouth daily. For blood pressure control   EKG performed showing atrial fibrillation    I have discontinued Mallie Mussel C. Merced Jr.'s methylPREDNISolone and HYDROcodone-acetaminophen. I am also having him start on metoprolol succinate. Additionally, I am having him maintain his aspirin, ELDERBERRY PO, simethicone, Liniments (BLUE-EMU SUPER STRENGTH EX), amLODipine, acetaminophen, Calcium-Magnesium-Zinc (CAL-MAG-ZINC PO), CANNABIDIOL PO, fluticasone, and gabapentin.  Allergies as of 08/14/2019      Reactions   Ciprofloxacin Other (See Comments)   Increased lower extremity weakness   Lipitor [atorvastatin Calcium]    Generic lipitor caused joint pain   Tamiflu [oseltamivir]    Extreme weakness and joint instability      Medication List       Accurate as of Aug 14, 2019 11:59 PM. If you have any questions, ask your nurse or doctor.        STOP taking these medications   HYDROcodone-acetaminophen 5-325 MG tablet Commonly known as: NORCO/VICODIN Stopped by: Claretta Fraise, MD   methylPREDNISolone 4 MG tablet Commonly known as: MEDROL Stopped by: Claretta Fraise, MD     TAKE these medications   acetaminophen 650 MG CR tablet Commonly known as: TYLENOL Take 1,300 mg by mouth in the morning, at noon, and at bedtime.   amLODipine 10 MG tablet Commonly known as:  NORVASC Take 1 tablet (10 mg total) by mouth daily. What changed: when to take this   aspirin 81 MG EC tablet Commonly known as: Aspirin Adult Low Dose Take 1 tablet (81 mg total) by mouth daily. Swallow whole.   BLUE-EMU SUPER STRENGTH EX Apply 1 application topically 4 (four) times daily as needed (pain.).   CAL-MAG-ZINC PO Take 1 tablet by mouth in the morning and at bedtime.   CANNABIDIOL PO Take 10 drops by mouth in the morning and at bedtime. CBD OIL   ELDERBERRY PO Take 15 mLs by mouth daily. In the morning   fluticasone 50 MCG/ACT nasal spray Commonly known as: FLONASE Place 1-2 sprays into both nostrils daily as needed for allergies or rhinitis (sinus/congestion.).   gabapentin 100 MG capsule Commonly known as: Neurontin Take 2 capsules (200 mg total) by mouth in the morning, at noon, and at bedtime.   metoprolol succinate 50  MG 24 hr tablet Commonly known as: TOPROL-XL Take 1 tablet (50 mg total) by mouth daily. For blood pressure control Started by: Claretta Fraise, MD   simethicone 125 MG chewable tablet Commonly known as: MYLICON Chew 782 mg by mouth every 6 (six) hours as needed for flatulence.      Patient was reluctant to accept the diagnosis of atrial fibrillation in spite of EKG and exam.  He says that he had an irregular beat a while back that was dismissed and he thinks that is all it was this time.  Looking back at his EKG from a couple of months ago shows that atrial fibrillation was present at that time as well.  After discussion the patient was willing to start on metoprolol to control rate.  He is interested in seeing a cardiologist.  However he was not willing to start anticoagulation at this time.  Follow-up: Return in about 3 months (around 11/14/2019).  Claretta Fraise, M.D.

## 2019-08-15 ENCOUNTER — Other Ambulatory Visit: Payer: Medicare Other

## 2019-08-15 DIAGNOSIS — I10 Essential (primary) hypertension: Secondary | ICD-10-CM | POA: Diagnosis not present

## 2019-08-15 DIAGNOSIS — E782 Mixed hyperlipidemia: Secondary | ICD-10-CM | POA: Diagnosis not present

## 2019-08-15 DIAGNOSIS — R0989 Other specified symptoms and signs involving the circulatory and respiratory systems: Secondary | ICD-10-CM | POA: Diagnosis not present

## 2019-08-16 LAB — CMP14+EGFR
ALT: 12 IU/L (ref 0–44)
AST: 18 IU/L (ref 0–40)
Albumin/Globulin Ratio: 2.1 (ref 1.2–2.2)
Albumin: 4.4 g/dL (ref 3.7–4.7)
Alkaline Phosphatase: 56 IU/L (ref 48–121)
BUN/Creatinine Ratio: 13 (ref 10–24)
BUN: 16 mg/dL (ref 8–27)
Bilirubin Total: 0.3 mg/dL (ref 0.0–1.2)
CO2: 22 mmol/L (ref 20–29)
Calcium: 9.4 mg/dL (ref 8.6–10.2)
Chloride: 104 mmol/L (ref 96–106)
Creatinine, Ser: 1.19 mg/dL (ref 0.76–1.27)
GFR calc Af Amer: 68 mL/min/{1.73_m2} (ref >59)
GFR calc non Af Amer: 59 mL/min/{1.73_m2} — ABNORMAL LOW (ref >59)
Globulin, Total: 2.1 g/dL (ref 1.5–4.5)
Glucose: 143 mg/dL — ABNORMAL HIGH (ref 65–99)
Potassium: 4.3 mmol/L (ref 3.5–5.2)
Sodium: 142 mmol/L (ref 134–144)
Total Protein: 6.5 g/dL (ref 6.0–8.5)

## 2019-08-16 LAB — CBC WITH DIFFERENTIAL/PLATELET
Basophils Absolute: 0.1 10*3/uL (ref 0.0–0.2)
Basos: 1 %
EOS (ABSOLUTE): 0.2 10*3/uL (ref 0.0–0.4)
Eos: 3 %
Hematocrit: 46.9 % (ref 37.5–51.0)
Hemoglobin: 15.7 g/dL (ref 13.0–17.7)
Immature Grans (Abs): 0 10*3/uL (ref 0.0–0.1)
Immature Granulocytes: 0 %
Lymphocytes Absolute: 1.9 10*3/uL (ref 0.7–3.1)
Lymphs: 34 %
MCH: 32.4 pg (ref 26.6–33.0)
MCHC: 33.5 g/dL (ref 31.5–35.7)
MCV: 97 fL (ref 79–97)
Monocytes Absolute: 0.6 10*3/uL (ref 0.1–0.9)
Monocytes: 10 %
Neutrophils Absolute: 2.9 10*3/uL (ref 1.4–7.0)
Neutrophils: 52 %
Platelets: 224 10*3/uL (ref 150–450)
RBC: 4.84 x10E6/uL (ref 4.14–5.80)
RDW: 13.5 % (ref 11.6–15.4)
WBC: 5.6 10*3/uL (ref 3.4–10.8)

## 2019-08-16 LAB — LIPID PANEL
Chol/HDL Ratio: 5.4 ratio — ABNORMAL HIGH (ref 0.0–5.0)
Cholesterol, Total: 199 mg/dL (ref 100–199)
HDL: 37 mg/dL — ABNORMAL LOW (ref >39)
LDL Chol Calc (NIH): 113 mg/dL — ABNORMAL HIGH (ref 0–99)
Triglycerides: 281 mg/dL — ABNORMAL HIGH (ref 0–149)
VLDL Cholesterol Cal: 49 mg/dL — ABNORMAL HIGH (ref 5–40)

## 2019-08-18 ENCOUNTER — Encounter: Payer: Self-pay | Admitting: Family Medicine

## 2019-08-19 ENCOUNTER — Ambulatory Visit (HOSPITAL_COMMUNITY)
Admission: RE | Admit: 2019-08-19 | Discharge: 2019-08-19 | Disposition: A | Discharge status: Home / Self Care | Payer: Medicare Other | Source: Ambulatory Visit | Attending: Specialist | Admitting: Specialist

## 2019-08-19 ENCOUNTER — Encounter: Payer: Self-pay | Admitting: *Deleted

## 2019-08-19 ENCOUNTER — Other Ambulatory Visit: Payer: Self-pay

## 2019-08-19 DIAGNOSIS — M545 Low back pain, unspecified: Secondary | ICD-10-CM

## 2019-09-10 DIAGNOSIS — M48062 Spinal stenosis, lumbar region with neurogenic claudication: Secondary | ICD-10-CM | POA: Diagnosis not present

## 2020-01-02 ENCOUNTER — Other Ambulatory Visit: Payer: Self-pay | Admitting: Neurology

## 2020-02-18 ENCOUNTER — Ambulatory Visit: Payer: Medicare Other | Admitting: Family Medicine

## 2020-02-23 ENCOUNTER — Telehealth: Payer: Self-pay

## 2020-02-23 NOTE — Telephone Encounter (Signed)
Appointment scheduled.

## 2020-03-01 ENCOUNTER — Encounter: Payer: Self-pay | Admitting: Family Medicine

## 2020-03-01 ENCOUNTER — Other Ambulatory Visit: Payer: Self-pay

## 2020-03-01 ENCOUNTER — Ambulatory Visit (INDEPENDENT_AMBULATORY_CARE_PROVIDER_SITE_OTHER): Payer: Medicare Other | Admitting: Family Medicine

## 2020-03-01 VITALS — BP 132/80 | HR 66 | Temp 97.6°F | Ht 70.0 in | Wt 211.4 lb

## 2020-03-01 DIAGNOSIS — Z23 Encounter for immunization: Secondary | ICD-10-CM

## 2020-03-01 DIAGNOSIS — I10 Essential (primary) hypertension: Secondary | ICD-10-CM | POA: Diagnosis not present

## 2020-03-08 ENCOUNTER — Encounter: Payer: Self-pay | Admitting: Family Medicine

## 2020-03-08 NOTE — Progress Notes (Signed)
Subjective:  Patient ID: Jesse Barton., male    DOB: 1941-02-21  Age: 79 y.o. MRN: [id number]  CC: Hypertension   HPI Jesse Barton. presents for  follow-up of hypertension. Patient has no history of headache chest pain or shortness of breath or recent cough. Patient also denies symptoms of TIA such as focal numbness or weakness. Patient denies side effects from medication. States taking it regularly. Had several elevated readings at home recently. 200/120, 198/102, for example. It was much lower here, He is still taking metoprolol but is not taking the amlodipine.    History Eshan has a past medical history of Arthritis, Cancer ([place]), History of kidney stones, Hypertension, Kidney stone, and Sciatic nerve pain.   He has a past surgical history that includes Kidney surgery; Eye surgery (Bilateral); Nephrectomy (Left); Cataract extraction; Cystoscopy/ureteroscopy/holmium laser/stent placement (Right, 11/19/2018); Cystoscopy/ureteroscopy/holmium laser/stent placement (Right, 12/17/2018); and Superficial peroneal nerve release (Right, 05/21/2019).   His family history includes Cancer in his mother; Cancer (age of onset: 88) in his sister; Leukemia in his father; Schizophrenia in his sister.He reports that he has been smoking cigars. He has never used smokeless tobacco. He reports current alcohol use. He reports current drug use. Drug: Marijuana.  Current Outpatient Medications on File Prior to Visit  Medication Sig Dispense Refill   acetaminophen (TYLENOL) 650 MG CR tablet Take 1,300 mg by mouth in the morning, at noon, and at bedtime.     amLODipine (NORVASC) 10 MG tablet Take 1 tablet (10 mg total) by mouth daily. (Patient taking differently: Take 10 mg by mouth every evening.) 90 tablet 3   Calcium-Magnesium-Zinc (CAL-MAG-ZINC PO) Take 1 tablet by mouth in the morning and at bedtime.     CANNABIDIOL PO Take 10 drops by mouth in the morning and at bedtime. CBD OIL     ELDERBERRY PO Take  15 mLs by mouth daily. In the morning     fluticasone (FLONASE) 50 MCG/ACT nasal spray Place 1-2 sprays into both nostrils daily as needed for allergies or rhinitis (sinus/congestion.).     gabapentin (NEURONTIN) 100 MG capsule Take 2 capsules (200 mg total) by mouth in the morning, at noon, and at bedtime. 180 capsule 5   Liniments (BLUE-EMU SUPER STRENGTH EX) Apply 1 application topically 4 (four) times daily as needed (pain.).      metoprolol succinate (TOPROL-XL) 50 MG 24 hr tablet Take 1 tablet (50 mg total) by mouth daily. For blood pressure control 30 tablet 2   simethicone (MYLICON) 973 MG chewable tablet Chew 125 mg by mouth every 6 (six) hours as needed for flatulence.     aspirin (ASPIRIN ADULT LOW DOSE) 81 MG EC tablet Take 1 tablet (81 mg total) by mouth daily. Swallow whole. (Patient not taking: No sig reported) 30 tablet 12   No current facility-administered medications on file prior to visit.    ROS Review of Systems  Constitutional: Negative for fever.  Respiratory: Negative for shortness of breath.   Cardiovascular: Negative for chest pain.  Musculoskeletal: Negative for arthralgias.  Skin: Negative for rash.    Objective:  BP 132/80    Pulse 66    Temp 97.6 F (36.4 C) (Temporal)    Ht 5\' 10"  (1.778 m)    Wt 211 lb 6.4 oz (95.9 kg)    BMI 30.33 kg/m   BP Readings from Last 3 Encounters:  03/01/20 132/80  08/14/19 132/74  05/21/19 (!) 142/97    Wt Readings from Last  3 Encounters:  03/01/20 211 lb 6.4 oz (95.9 kg)  08/14/19 227 lb 4 oz (103.1 kg)  05/21/19 227 lb 1.2 oz (103 kg)     Physical Exam Vitals reviewed.  Constitutional:      Appearance: He is well-developed and well-nourished.  HENT:     Head: Normocephalic and atraumatic.     Right Ear: External ear normal.     Left Ear: External ear normal.     Mouth/Throat:     Pharynx: No oropharyngeal exudate or posterior oropharyngeal erythema.  Eyes:     Pupils: Pupils are equal, round, and  reactive to light.  Cardiovascular:     Rate and Rhythm: Normal rate and regular rhythm.     Heart sounds: No murmur heard.   Pulmonary:     Effort: No respiratory distress.     Breath sounds: Normal breath sounds.  Musculoskeletal:     Cervical back: Normal range of motion and neck supple.  Neurological:     Mental Status: He is alert and oriented to person, place, and time.       Assessment & Plan:   Hikeem was seen today for hypertension.  Diagnoses and all orders for this visit:  Need for immunization against influenza -     Flu Vaccine QUAD High Dose(Fluad)  Primary hypertension   Allergies as of 03/01/2020      Reactions   Ciprofloxacin Other (See Comments)   Increased lower extremity weakness   Lipitor [atorvastatin Calcium]    Generic lipitor caused joint pain   Tamiflu [oseltamivir]    Extreme weakness and joint instability      Medication List       Accurate as of March 01, 2020 11:59 PM. If you have any questions, ask your nurse or doctor.        acetaminophen 650 MG CR tablet Commonly known as: TYLENOL Take 1,300 mg by mouth in the morning, at noon, and at bedtime.   amLODipine 10 MG tablet Commonly known as: NORVASC Take 1 tablet (10 mg total) by mouth daily. What changed: when to take this   aspirin 81 MG EC tablet Commonly known as: Aspirin Adult Low Dose Take 1 tablet (81 mg total) by mouth daily. Swallow whole.   BLUE-EMU SUPER STRENGTH EX Apply 1 application topically 4 (four) times daily as needed (pain.).   CAL-MAG-ZINC PO Take 1 tablet by mouth in the morning and at bedtime.   CANNABIDIOL PO Take 10 drops by mouth in the morning and at bedtime. CBD OIL   ELDERBERRY PO Take 15 mLs by mouth daily. In the morning   fluticasone 50 MCG/ACT nasal spray Commonly known as: FLONASE Place 1-2 sprays into both nostrils daily as needed for allergies or rhinitis (sinus/congestion.).   gabapentin 100 MG capsule Commonly known as:  Neurontin Take 2 capsules (200 mg total) by mouth in the morning, at noon, and at bedtime.   metoprolol succinate 50 MG 24 hr tablet Commonly known as: TOPROL-XL Take 1 tablet (50 mg total) by mouth daily. For blood pressure control   simethicone 125 MG chewable tablet Commonly known as: MYLICON Chew 595 mg by mouth every 6 (six) hours as needed for flatulence.       No orders of the defined types were placed in this encounter.   Pt. Should bring in his BP monitor from hom for readings to compare to our calibrated machinesd  Follow-up: Return in about 6 weeks (around 04/12/2020).  Claretta Fraise, M.D.

## 2020-03-09 ENCOUNTER — Ambulatory Visit: Payer: Medicare Other | Admitting: *Deleted

## 2020-03-09 ENCOUNTER — Other Ambulatory Visit: Payer: Self-pay | Admitting: *Deleted

## 2020-03-09 ENCOUNTER — Other Ambulatory Visit: Payer: Self-pay

## 2020-03-09 VITALS — BP 139/88 | HR 71

## 2020-03-09 DIAGNOSIS — E782 Mixed hyperlipidemia: Secondary | ICD-10-CM | POA: Diagnosis not present

## 2020-03-09 DIAGNOSIS — I10 Essential (primary) hypertension: Secondary | ICD-10-CM | POA: Diagnosis not present

## 2020-03-09 LAB — CMP14+EGFR
ALT: 9 IU/L (ref 0–44)
AST: 16 IU/L (ref 0–40)
Albumin/Globulin Ratio: 2.2 (ref 1.2–2.2)
Albumin: 4.7 g/dL (ref 3.7–4.7)
Alkaline Phosphatase: 64 IU/L (ref 44–121)
BUN/Creatinine Ratio: 17 (ref 10–24)
BUN: 16 mg/dL (ref 8–27)
Bilirubin Total: 0.4 mg/dL (ref 0.0–1.2)
CO2: 22 mmol/L (ref 20–29)
Calcium: 9.5 mg/dL (ref 8.6–10.2)
Chloride: 102 mmol/L (ref 96–106)
Creatinine, Ser: 0.94 mg/dL (ref 0.76–1.27)
GFR calc Af Amer: 90 mL/min/{1.73_m2} (ref >59)
GFR calc non Af Amer: 78 mL/min/{1.73_m2} (ref >59)
Globulin, Total: 2.1 g/dL (ref 1.5–4.5)
Glucose: 114 mg/dL — ABNORMAL HIGH (ref 65–99)
Potassium: 4.6 mmol/L (ref 3.5–5.2)
Sodium: 139 mmol/L (ref 134–144)
Total Protein: 6.8 g/dL (ref 6.0–8.5)

## 2020-03-09 LAB — CBC WITH DIFFERENTIAL/PLATELET
Basophils Absolute: 0.1 10*3/uL (ref 0.0–0.2)
Basos: 1 %
EOS (ABSOLUTE): 0.2 10*3/uL (ref 0.0–0.4)
Eos: 3 %
Hematocrit: 43.4 % (ref 37.5–51.0)
Hemoglobin: 15.2 g/dL (ref 13.0–17.7)
Immature Grans (Abs): 0 10*3/uL (ref 0.0–0.1)
Immature Granulocytes: 0 %
Lymphocytes Absolute: 2.1 10*3/uL (ref 0.7–3.1)
Lymphs: 32 %
MCH: 33.7 pg — ABNORMAL HIGH (ref 26.6–33.0)
MCHC: 35 g/dL (ref 31.5–35.7)
MCV: 96 fL (ref 79–97)
Monocytes Absolute: 0.6 10*3/uL (ref 0.1–0.9)
Monocytes: 9 %
Neutrophils Absolute: 3.6 10*3/uL (ref 1.4–7.0)
Neutrophils: 55 %
Platelets: 228 10*3/uL (ref 150–450)
RBC: 4.51 x10E6/uL (ref 4.14–5.80)
RDW: 13.4 % (ref 11.6–15.4)
WBC: 6.5 10*3/uL (ref 3.4–10.8)

## 2020-03-09 LAB — LIPID PANEL
Chol/HDL Ratio: 4.5 ratio (ref 0.0–5.0)
Cholesterol, Total: 200 mg/dL — ABNORMAL HIGH (ref 100–199)
HDL: 44 mg/dL (ref >39)
LDL Chol Calc (NIH): 112 mg/dL — ABNORMAL HIGH (ref 0–99)
Triglycerides: 255 mg/dL — ABNORMAL HIGH (ref 0–149)
VLDL Cholesterol Cal: 44 mg/dL — ABNORMAL HIGH (ref 5–40)

## 2020-03-09 NOTE — Progress Notes (Signed)
Patient in today to compare BP readings with home bp machine and office machine.  Off BP reading 139/88 71 and Home cuff reading 202/120 91.  Patient states that he is going to take home cuff back and get a new one if possible.   Manual reading 136/84  68

## 2020-03-11 ENCOUNTER — Telehealth: Payer: Self-pay | Admitting: Family Medicine

## 2020-03-17 ENCOUNTER — Other Ambulatory Visit: Payer: Self-pay | Admitting: Family Medicine

## 2020-03-17 MED ORDER — METOPROLOL SUCCINATE ER 100 MG PO TB24: 100.0000 mg | ORAL_TABLET | Freq: Every day | ORAL | 1 refills | Status: DC

## 2020-08-25 ENCOUNTER — Other Ambulatory Visit: Payer: Self-pay | Admitting: Family Medicine

## 2020-08-30 ENCOUNTER — Encounter: Payer: Self-pay | Admitting: Family Medicine

## 2020-08-30 ENCOUNTER — Ambulatory Visit (INDEPENDENT_AMBULATORY_CARE_PROVIDER_SITE_OTHER): Payer: Medicare Other | Admitting: Family Medicine

## 2020-08-30 ENCOUNTER — Other Ambulatory Visit: Payer: Self-pay

## 2020-08-30 VITALS — BP 194/89 | HR 69 | Temp 97.7°F | Ht 70.0 in | Wt 229.4 lb

## 2020-08-30 DIAGNOSIS — G5731 Lesion of lateral popliteal nerve, right lower limb: Secondary | ICD-10-CM

## 2020-08-30 DIAGNOSIS — Z23 Encounter for immunization: Secondary | ICD-10-CM

## 2020-08-30 DIAGNOSIS — M5441 Lumbago with sciatica, right side: Secondary | ICD-10-CM | POA: Diagnosis not present

## 2020-08-30 DIAGNOSIS — I10 Essential (primary) hypertension: Secondary | ICD-10-CM

## 2020-08-30 DIAGNOSIS — G8929 Other chronic pain: Secondary | ICD-10-CM

## 2020-08-30 MED ORDER — OLMESARTAN MEDOXOMIL 40 MG PO TABS: 40.0000 mg | ORAL_TABLET | Freq: Every day | ORAL | 1 refills | Status: DC

## 2020-08-30 MED ORDER — METOPROLOL SUCCINATE ER 25 MG PO TB24: 25.0000 mg | ORAL_TABLET | Freq: Every day | ORAL | 0 refills | Status: DC

## 2020-08-30 NOTE — Progress Notes (Signed)
Subjective:  Patient ID: Jesse Mane., male    DOB: 02-Apr-1940  Age: 80 y.o. MRN: [id number]  CC: Hypertension   HPI Donold Marotto. presents for  follow-up of hypertension. Patient has no history of headache chest pain or shortness of breath or recent cough. Patient also denies symptoms of TIA such as focal numbness or weakness. Patient denies side effects from medication. States taking it regularly. Taking metoprolol 1/2 daily,  but it crumbles so he feels he isn't getting more than 1/4 to 1/2 of the dose.   Right leg weak. Surgery for peroneal decompression last year. Didn't help. Has tall walker with arm rests.    History Jaquavion has a past medical history of Arthritis, Cancer ([place]), History of kidney stones, Hypertension, Kidney stone, and Sciatic nerve pain.   He has a past surgical history that includes Kidney surgery; Eye surgery (Bilateral); Nephrectomy (Left); Cataract extraction; Cystoscopy/ureteroscopy/holmium laser/stent placement (Right, 11/19/2018); Cystoscopy/ureteroscopy/holmium laser/stent placement (Right, 12/17/2018); and Superficial peroneal nerve release (Right, 05/21/2019).   His family history includes Cancer in his mother; Cancer (age of onset: 25) in his sister; Leukemia in his father; Schizophrenia in his sister.He reports that he has been smoking cigars. He has never used smokeless tobacco. He reports current alcohol use. He reports current drug use. Drug: Marijuana.  Current Outpatient Medications on File Prior to Visit  Medication Sig Dispense Refill   acetaminophen (TYLENOL) 650 MG CR tablet Take 1,300 mg by mouth in the morning, at noon, and at bedtime.     amLODipine (NORVASC) 10 MG tablet Take 1 tablet (10 mg total) by mouth daily. (Patient taking differently: Take 10 mg by mouth every evening.) 90 tablet 3   aspirin (ASPIRIN ADULT LOW DOSE) 81 MG EC tablet Take 1 tablet (81 mg total) by mouth daily. Swallow whole. 30 tablet 12   Calcium-Magnesium-Zinc  (CAL-MAG-ZINC PO) Take 1 tablet by mouth in the morning and at bedtime.     CANNABIDIOL PO Take 10 drops by mouth in the morning and at bedtime. CBD OIL     ELDERBERRY PO Take 15 mLs by mouth daily. In the morning     fluticasone (FLONASE) 50 MCG/ACT nasal spray Place 1-2 sprays into both nostrils daily as needed for allergies or rhinitis (sinus/congestion.).     gabapentin (NEURONTIN) 100 MG capsule Take 2 capsules (200 mg total) by mouth in the morning, at noon, and at bedtime. 180 capsule 5   Liniments (BLUE-EMU SUPER STRENGTH EX) Apply 1 application topically 4 (four) times daily as needed (pain.).      simethicone (MYLICON) 517 MG chewable tablet Chew 125 mg by mouth every 6 (six) hours as needed for flatulence.     No current facility-administered medications on file prior to visit.    ROS Review of Systems  Constitutional: Negative.  Negative for fever.  HENT: Negative.    Eyes:  Negative for visual disturbance.  Respiratory:  Negative for cough and shortness of breath.   Cardiovascular:  Negative for chest pain and leg swelling.  Gastrointestinal:  Negative for abdominal pain, diarrhea, nausea and vomiting.  Genitourinary:  Negative for difficulty urinating.  Musculoskeletal:  Negative for arthralgias and myalgias.  Skin:  Negative for rash.  Neurological:  Positive for weakness (RLE). Negative for headaches.  Psychiatric/Behavioral:  Negative for sleep disturbance.    Objective:  BP (!) 194/89   Pulse 69   Temp 97.7 F (36.5 C)   Ht 5\' 10"  (1.778 m)  Wt 229 lb 6.4 oz (104.1 kg)   SpO2 97%   BMI 32.92 kg/m   BP Readings from Last 3 Encounters:  08/30/20 (!) 194/89  03/09/20 139/88  03/01/20 132/80    Wt Readings from Last 3 Encounters:  08/30/20 229 lb 6.4 oz (104.1 kg)  03/01/20 211 lb 6.4 oz (95.9 kg)  08/14/19 227 lb 4 oz (103.1 kg)     Physical Exam Vitals reviewed.  Constitutional:      Appearance: He is well-developed.  HENT:     Head:  Normocephalic and atraumatic.     Right Ear: External ear normal.     Left Ear: External ear normal.     Mouth/Throat:     Pharynx: No oropharyngeal exudate or posterior oropharyngeal erythema.  Eyes:     Pupils: Pupils are equal, round, and reactive to light.  Cardiovascular:     Rate and Rhythm: Normal rate and regular rhythm.     Heart sounds: No murmur heard. Pulmonary:     Effort: No respiratory distress.     Breath sounds: Normal breath sounds.  Musculoskeletal:     Cervical back: Normal range of motion and neck supple.  Neurological:     Mental Status: He is alert and oriented to person, place, and time.      Assessment & Plan:   Oseias was seen today for hypertension.  Diagnoses and all orders for this visit:  Need for pneumococcal vaccination -     Pneumococcal polysaccharide vaccine 23-valent greater than or equal to 2yo subcutaneous/IM  Neuropathy of right peroneal nerve  Chronic midline low back pain with right-sided sciatica  Primary hypertension  Other orders -     olmesartan (BENICAR) 40 MG tablet; Take 1 tablet (40 mg total) by mouth daily. For blood pressure -     metoprolol succinate (TOPROL-XL) 25 MG 24 hr tablet; Take 1 tablet (25 mg total) by mouth daily. For two weeks Then DC  Allergies as of 08/30/2020       Reactions   Ciprofloxacin Other (See Comments)   Increased lower extremity weakness   Lipitor [atorvastatin Calcium]    Generic lipitor caused joint pain   Tamiflu [oseltamivir]    Extreme weakness and joint instability        Medication List        Accurate as of August 30, 2020  9:05 PM. If you have any questions, ask your nurse or doctor.          acetaminophen 650 MG CR tablet Commonly known as: TYLENOL Take 1,300 mg by mouth in the morning, at noon, and at bedtime.   amLODipine 10 MG tablet Commonly known as: NORVASC Take 1 tablet (10 mg total) by mouth daily. What changed: when to take this   aspirin 81 MG EC  tablet Commonly known as: Aspirin Adult Low Dose Take 1 tablet (81 mg total) by mouth daily. Swallow whole.   BLUE-EMU SUPER STRENGTH EX Apply 1 application topically 4 (four) times daily as needed (pain.).   CAL-MAG-ZINC PO Take 1 tablet by mouth in the morning and at bedtime.   CANNABIDIOL PO Take 10 drops by mouth in the morning and at bedtime. CBD OIL   ELDERBERRY PO Take 15 mLs by mouth daily. In the morning   fluticasone 50 MCG/ACT nasal spray Commonly known as: FLONASE Place 1-2 sprays into both nostrils daily as needed for allergies or rhinitis (sinus/congestion.).   gabapentin 100 MG capsule Commonly known as: Neurontin Take  2 capsules (200 mg total) by mouth in the morning, at noon, and at bedtime.   metoprolol succinate 25 MG 24 hr tablet Commonly known as: TOPROL-XL Take 1 tablet (25 mg total) by mouth daily. For two weeks Then DC What changed:  medication strength how much to take additional instructions Changed by: Claretta Fraise, MD   olmesartan 40 MG tablet Commonly known as: Benicar Take 1 tablet (40 mg total) by mouth daily. For blood pressure Started by: Claretta Fraise, MD   simethicone 125 MG chewable tablet Commonly known as: MYLICON Chew 542 mg by mouth every 6 (six) hours as needed for flatulence.        Meds ordered this encounter  Medications   olmesartan (BENICAR) 40 MG tablet    Sig: Take 1 tablet (40 mg total) by mouth daily. For blood pressure    Dispense:  90 tablet    Refill:  1   metoprolol succinate (TOPROL-XL) 25 MG 24 hr tablet    Sig: Take 1 tablet (25 mg total) by mouth daily. For two weeks Then DC    Dispense:  14 tablet    Refill:  0      Follow-up: No follow-ups on file.  Claretta Fraise, M.D.

## 2020-09-03 ENCOUNTER — Ambulatory Visit: Payer: Medicare Other | Admitting: Nurse Practitioner

## 2020-09-03 ENCOUNTER — Encounter: Payer: Self-pay | Admitting: Nurse Practitioner

## 2020-09-03 ENCOUNTER — Telehealth: Payer: Self-pay | Admitting: Family Medicine

## 2020-09-03 VITALS — BP 216/92 | HR 85 | Temp 97.8°F | Ht 70.0 in | Wt 229.0 lb

## 2020-09-03 DIAGNOSIS — I10 Essential (primary) hypertension: Secondary | ICD-10-CM

## 2020-09-03 MED ORDER — AMLODIPINE BESYLATE 10 MG PO TABS: 10.0000 mg | ORAL_TABLET | Freq: Every evening | ORAL | 1 refills | Status: DC

## 2020-09-03 NOTE — Progress Notes (Signed)
   Subjective:    Patient ID: Jesse Mane., male    DOB: 01-06-1941, 80 y.o.   MRN: [id number]   Chief Complaint: Hypertension   HPI Patient come sin today escorted by his wife stating that his blood pressure has been high. He saw Dr. Livia Snellen on Monday and was given a pneumonia vaccine and he has felt ache and tired every since. Dr. Livia Snellen also changed his blood pressure meds. He was tld to drop metoprolol to 25mg  fo 2 weeks then stop it all together. He also started him on Benicar 40mg  daily. He has been checking his blood pressure at home prior to medication change and was running in 024 systolic . Since started benicar it has been running in 140's. He has not been on amlodipine since last year, but med is still on chart. BP Readings from Last 3 Encounters:  09/03/20 (!) 216/92  08/30/20 (!) 194/89  03/09/20 139/88       Review of Systems  Constitutional: Negative.  Negative for diaphoresis.  Eyes:  Negative for pain.  Respiratory:  Negative for shortness of breath.   Cardiovascular:  Negative for chest pain, palpitations and leg swelling.  Gastrointestinal:  Negative for abdominal pain.  Endocrine: Negative for polydipsia.  Skin:  Negative for rash.  Neurological:  Negative for dizziness, weakness and headaches.  Hematological:  Does not bruise/bleed easily.  All other systems reviewed and are negative.     Objective:   Physical Exam Vitals and nursing note reviewed.  Constitutional:      Appearance: Normal appearance. He is obese.  Cardiovascular:     Rate and Rhythm: Normal rate and regular rhythm.     Heart sounds: Normal heart sounds.  Pulmonary:     Effort: Pulmonary effort is normal.     Breath sounds: Normal breath sounds.  Skin:    General: Skin is warm.  Neurological:     General: No focal deficit present.     Mental Status: He is alert and oriented to person, place, and time.  Psychiatric:        Mood and Affect: Mood normal.        Behavior: Behavior  normal.   BP (!) 216/92   Pulse 85   Temp 97.8 F (36.6 C) (Temporal)   Ht 5\' 10"  (1.778 m)   Wt 229 lb (103.9 kg)   BMI 32.86 kg/m         Assessment & Plan:  Jesse Mane. in today with chief complaint of Hypertension   1. Primary hypertension Continue metoprolol 25 daily Continue benicar 40 daily Add back amlodipine 10mg  Keep diary of blood pressure at home. If develop chest pain or severe haeadcahe go to the ED.    The above assessment and management plan was discussed with the patient. The patient verbalized understanding of and has agreed to the management plan. Patient is aware to call the clinic if symptoms persist or worsen. Patient is aware when to return to the clinic for a follow-up visit. Patient educated on when it is appropriate to go to the emergency department.   Mary-Margaret Hassell Done, FNP

## 2020-09-03 NOTE — Telephone Encounter (Signed)
PT PUT ON WALK IN SCHEDULED WHEN HE WALKED IN

## 2020-09-03 NOTE — Patient Instructions (Signed)

## 2020-09-06 ENCOUNTER — Other Ambulatory Visit: Payer: Self-pay

## 2020-09-06 ENCOUNTER — Ambulatory Visit (INDEPENDENT_AMBULATORY_CARE_PROVIDER_SITE_OTHER): Payer: Medicare Other | Admitting: Nurse Practitioner

## 2020-09-06 ENCOUNTER — Other Ambulatory Visit: Payer: Medicare Other

## 2020-09-06 ENCOUNTER — Other Ambulatory Visit: Payer: Self-pay | Admitting: *Deleted

## 2020-09-06 ENCOUNTER — Encounter: Payer: Self-pay | Admitting: Nurse Practitioner

## 2020-09-06 VITALS — BP 148/82 | HR 80 | Temp 98.6°F | Resp 20 | Ht 70.0 in | Wt 228.0 lb

## 2020-09-06 DIAGNOSIS — E782 Mixed hyperlipidemia: Secondary | ICD-10-CM | POA: Diagnosis not present

## 2020-09-06 DIAGNOSIS — I10 Essential (primary) hypertension: Secondary | ICD-10-CM

## 2020-09-06 LAB — LIPID PANEL
Chol/HDL Ratio: 4.5 ratio (ref 0.0–5.0)
Cholesterol, Total: 195 mg/dL (ref 100–199)
HDL: 43 mg/dL (ref >39)
LDL Chol Calc (NIH): 109 mg/dL — ABNORMAL HIGH (ref 0–99)
Triglycerides: 247 mg/dL — ABNORMAL HIGH (ref 0–149)
VLDL Cholesterol Cal: 43 mg/dL — ABNORMAL HIGH (ref 5–40)

## 2020-09-06 LAB — CMP14+EGFR
ALT: 13 IU/L (ref 0–44)
AST: 18 IU/L (ref 0–40)
Albumin/Globulin Ratio: 2.1 (ref 1.2–2.2)
Albumin: 4.6 g/dL (ref 3.7–4.7)
Alkaline Phosphatase: 56 IU/L (ref 44–121)
BUN/Creatinine Ratio: 15 (ref 10–24)
BUN: 14 mg/dL (ref 8–27)
Bilirubin Total: 0.4 mg/dL (ref 0.0–1.2)
CO2: 24 mmol/L (ref 20–29)
Calcium: 10 mg/dL (ref 8.6–10.2)
Chloride: 102 mmol/L (ref 96–106)
Creatinine, Ser: 0.96 mg/dL (ref 0.76–1.27)
Globulin, Total: 2.2 g/dL (ref 1.5–4.5)
Glucose: 130 mg/dL — ABNORMAL HIGH (ref 65–99)
Potassium: 4.4 mmol/L (ref 3.5–5.2)
Sodium: 140 mmol/L (ref 134–144)
Total Protein: 6.8 g/dL (ref 6.0–8.5)
eGFR: 81 mL/min/{1.73_m2} (ref >59)

## 2020-09-06 LAB — CBC WITH DIFFERENTIAL/PLATELET
Basophils Absolute: 0.1 10*3/uL (ref 0.0–0.2)
Basos: 1 %
EOS (ABSOLUTE): 0.2 10*3/uL (ref 0.0–0.4)
Eos: 4 %
Hematocrit: 42.5 % (ref 37.5–51.0)
Hemoglobin: 14.4 g/dL (ref 13.0–17.7)
Immature Grans (Abs): 0 10*3/uL (ref 0.0–0.1)
Immature Granulocytes: 0 %
Lymphocytes Absolute: 1.8 10*3/uL (ref 0.7–3.1)
Lymphs: 38 %
MCH: 32.5 pg (ref 26.6–33.0)
MCHC: 33.9 g/dL (ref 31.5–35.7)
MCV: 96 fL (ref 79–97)
Monocytes Absolute: 0.5 10*3/uL (ref 0.1–0.9)
Monocytes: 10 %
Neutrophils Absolute: 2.1 10*3/uL (ref 1.4–7.0)
Neutrophils: 47 %
Platelets: 233 10*3/uL (ref 150–450)
RBC: 4.43 x10E6/uL (ref 4.14–5.80)
RDW: 12.2 % (ref 11.6–15.4)
WBC: 4.6 10*3/uL (ref 3.4–10.8)

## 2020-09-06 NOTE — Patient Instructions (Signed)

## 2020-09-06 NOTE — Progress Notes (Signed)
   Subjective:    Patient ID: Jesse Mane., male    DOB: Jul 17, 1940, 80 y.o.   MRN: [id number]   Chief Complaint: Recheck blood pressure   HPI Patient was seen on 09/03/20 with elevated blood pressure. There was some confusion while he was here as to exactly what he is taking. We decided to settle on metoprolol 25mg  benicar 40mg  daily and added amlodipine 10mg  back. Since that combination was started his blood pressure has come down. He denies any headahces, sob or headache.  BP Readings from Last 3 Encounters:  09/06/20 (!) 156/85  09/03/20 (!) 216/92  08/30/20 (!) 194/89       Review of Systems  Constitutional:  Negative for diaphoresis.  Eyes:  Negative for pain.  Respiratory:  Negative for shortness of breath.   Cardiovascular:  Negative for chest pain, palpitations and leg swelling.  Gastrointestinal:  Negative for abdominal pain.  Endocrine: Negative for polydipsia.  Skin:  Negative for rash.  Neurological:  Negative for dizziness, weakness and headaches.  Hematological:  Does not bruise/bleed easily.  All other systems reviewed and are negative.     Objective:   Physical Exam Vitals and nursing note reviewed.  Constitutional:      Appearance: Normal appearance. He is obese.  Cardiovascular:     Rate and Rhythm: Regular rhythm.     Heart sounds: Normal heart sounds.  Pulmonary:     Effort: Pulmonary effort is normal.     Breath sounds: Normal breath sounds.  Skin:    General: Skin is warm.  Neurological:     General: No focal deficit present.     Mental Status: He is alert.  Psychiatric:        Mood and Affect: Mood normal.        Behavior: Behavior normal.    BP (!) 148/82   Pulse 80   Temp 98.6 F (37 C) (Temporal)   Resp 20   Ht 5\' 10"  (1.778 m)   Wt 228 lb (103.4 kg)   SpO2 97%   BMI 32.71 kg/m       Assessment & Plan:   Jesse Mane. in today with chief complaint of Recheck blood pressure   1. Primary hypertension Continue  current meds Rest keep follow up with DR. Stack in 3 weeks.    The above assessment and management plan was discussed with the patient. The patient verbalized understanding of and has agreed to the management plan. Patient is aware to call the clinic if symptoms persist or worsen. Patient is aware when to return to the clinic for a follow-up visit. Patient educated on when it is appropriate to go to the emergency department.   Mary-Margaret Hassell Done, FNP

## 2020-09-07 NOTE — Progress Notes (Signed)
Hello Jesse Barton,  Your lab result is normal and/or stable.Some minor variations that are not significant are commonly marked abnormal, but do not represent any medical problem for you.  Best regards, Claretta Fraise, M.D.

## 2020-09-27 ENCOUNTER — Ambulatory Visit (INDEPENDENT_AMBULATORY_CARE_PROVIDER_SITE_OTHER): Payer: Medicare Other | Admitting: Family Medicine

## 2020-09-27 ENCOUNTER — Encounter: Payer: Self-pay | Admitting: Family Medicine

## 2020-09-27 ENCOUNTER — Other Ambulatory Visit: Payer: Self-pay

## 2020-09-27 VITALS — BP 138/62 | HR 84 | Temp 97.9°F | Ht 70.0 in | Wt 229.4 lb

## 2020-09-27 DIAGNOSIS — I10 Essential (primary) hypertension: Secondary | ICD-10-CM

## 2020-09-27 NOTE — Patient Instructions (Signed)
Fat and Cholesterol Restricted Eating Plan Eating a diet that limits fat and cholesterol may help lower your risk for heart disease and other conditions. Your body needs fat and cholesterol for basic functions, but eating too much of these things can be harmful to yourhealth. Your health care provider may order lab tests to check your blood fat (lipid) and cholesterol levels. This helps your health care provider understand your risk for certain conditions and whether you need to make diet changes. Work with your health care provider or dietitian to make an eating plan that isright for you. What are tips for following this plan? General guidelines  If you are overweight, work with your health care provider to lose weight safely. Losing just 5-10% of your body weight can improve your overall health and help prevent diseases such as diabetes and heart disease. Avoid: Foods with added sugar. Fried foods. Foods that contain partially hydrogenated oils, including stick margarine, some tub margarines, cookies, crackers, and other baked goods. Limit alcohol intake to no more than 1 drink a day for nonpregnant women and 2 drinks a day for men. One drink equals 12 oz of beer, 5 oz of wine, or 1 oz of hard liquor.  Reading food labels Check food labels for: Trans fats, partially hydrogenated oils, or high amounts of saturated fat. Avoid foods that contain saturated fat and trans fat. The amount of cholesterol in each serving. Try to eat no more than 200 mg of cholesterol each day. The amount of fiber in each serving. Try to eat at least 20-30 g of fiber each day. Choose foods with healthy fats, such as: Monounsaturated and polyunsaturated fats. These include olive and canola oil, flaxseeds, walnuts, almonds, and seeds. Omega-3 fats. These are found in foods such as salmon, mackerel, sardines, tuna, flaxseed oil, and ground flaxseeds. Choose grain products that have whole grains. Look for the word "whole"  as the first word in the ingredient list. Cooking Cook foods using methods other than frying. Baking, boiling, grilling, and broiling are some healthy options. Eat more home-cooked food and less restaurant, buffet, and fast food. Avoid cooking using saturated fats. Animal sources of saturated fats include meats, butter, and cream. Plant sources of saturated fats include palm oil, palm kernel oil, and coconut oil. Meal planning  At meals, imagine dividing your plate into fourths: Fill one-half of your plate with vegetables and green salads. Fill one-fourth of your plate with whole grains. Fill one-fourth of your plate with lean protein foods. Eat fish that is high in omega-3 fats at least two times a week. Eat more foods that contain fiber, such as whole grains, beans, apples, broccoli, carrots, peas, and barley. These foods help promote healthy cholesterol levels in the blood.  Recommended foods Grains Whole grains, such as whole wheat or whole grain breads, crackers, cereals, and pasta. Unsweetened oatmeal, bulgur, barley, quinoa, or brown rice. Corn or whole wheat flour tortillas. Vegetables Fresh or frozen vegetables (raw, steamed, roasted, or grilled). Green salads. Fruits All fresh, canned (in natural juice), or frozen fruits. Meats and other protein foods Ground beef (85% or leaner), grass-fed beef, or beef trimmed of fat. Skinless chicken or [place]. Ground chicken or [place]. Pork trimmed of fat. All fish and seafood. Egg whites. Dried beans, peas, or lentils. Unsalted nuts or seeds. Unsalted canned beans. Natural nut butters without added sugar and oil. Dairy Low-fat or nonfat dairy products, such as skim or 1% milk, 2% or reduced-fat cheeses, low-fat and fat-free ricotta  or cottage cheese, or plain low-fat and nonfat yogurt. Fats and oils Tub margarine without trans fats. Light or reduced-fat mayonnaise and salad dressings. Avocado. Olive, canola, sesame, or safflower oils. The  items listed above may not be a complete list of foods and beverages you can eat. Contact a dietitian for more information. Foods to avoid Grains White bread. White pasta. White rice. Cornbread. Bagels, pastries, and croissants. Crackers and snack foods that contain trans fat and hydrogenated oils. Vegetables Vegetables cooked in cheese, cream, or butter sauce. Fried vegetables. Fruits Canned fruit in heavy syrup. Fruit in cream or butter sauce. Fried fruit. Meats and other protein foods Fatty cuts of meat. Ribs, chicken wings, bacon, sausage, bologna, salami, chitterlings, fatback, hot dogs, bratwurst, and packaged lunch meats. Liver and organ meats. Whole eggs and egg yolks. Chicken and [place] with skin. Fried meat. Dairy Whole or 2% milk, cream, half-and-half, and cream cheese. Whole milk cheeses. Whole-fat or sweetened yogurt. Full-fat cheeses. Nondairy creamers and whipped toppings. Processed cheese, cheese spreads, and cheese curds. Beverages Alcohol. Sugar-sweetened drinks such as sodas, lemonade, and fruit drinks. Fats and oils Butter, stick margarine, lard, shortening, ghee, or bacon fat. Coconut, palm kernel, and palm oils. Sweets and desserts Corn syrup, sugars, honey, and molasses. Candy. Jam and jelly. Syrup. Sweetened cereals. Cookies, pies, cakes, donuts, muffins, and ice cream. The items listed above may not be a complete list of foods and beverages you should avoid. Contact a dietitian for more information. Summary Your body needs fat and cholesterol for basic functions. However, eating too much of these things can be harmful to your health. Work with your health care provider and dietitian to follow a diet low in fat and cholesterol. Doing this may help lower your risk for heart disease and other conditions. Choose healthy fats, such as monounsaturated and polyunsaturated fats, and foods high in omega-3 fatty acids. Eat fiber-rich foods, such as whole grains, beans, peas,  fruits, and vegetables. Limit or avoid alcohol, fried foods, and foods high in saturated fats, partially hydrogenated oils, and sugar. This information is not intended to replace advice given to you by your health care provider. Make sure you discuss any questions you have with your healthcare provider. Document Revised: 11/05/2019 Document Reviewed: 07/09/2019 Elsevier Patient Education  2022 [employer].

## 2020-09-27 NOTE — Progress Notes (Signed)
Subjective:  Patient ID: Jesse Mane., male    DOB: 08/08/1940  Age: 80 y.o. MRN: [id number]  CC: Medical Management of Chronic Issues   HPI Jesse Brule. presents for follow up  presents for  follow-up of hypertension. Patient has no history of headache chest pain or shortness of breath or recent cough. Patient also denies symptoms of TIA such as focal numbness or weakness. Patient denies side effects from medication. States taking it regularly.    Depression screen [facility] 2/9 09/27/2020 09/27/2020 08/30/2020  Decreased Interest 0 0 0  Down, Depressed, Hopeless 0 0 0  PHQ - 2 Score 0 0 0  Altered sleeping 0 - -  Tired, decreased energy 2 - -  Change in appetite 2 - -  Feeling bad or failure about yourself  0 - -  Trouble concentrating 0 - -  Moving slowly or fidgety/restless 0 - -  Suicidal thoughts 0 - -  PHQ-9 Score 4 - -  Difficult doing work/chores Not difficult at all - -    History Jesse Barton has a past medical history of Arthritis, Cancer ([place]), History of kidney stones, Hypertension, Kidney stone, and Sciatic nerve pain.   He has a past surgical history that includes Kidney surgery; Eye surgery (Bilateral); Nephrectomy (Left); Cataract extraction; Cystoscopy/ureteroscopy/holmium laser/stent placement (Right, 11/19/2018); Cystoscopy/ureteroscopy/holmium laser/stent placement (Right, 12/17/2018); and Superficial peroneal nerve release (Right, 05/21/2019).   His family history includes Cancer in his mother; Cancer (age of onset: 21) in his sister; Leukemia in his father; Schizophrenia in his sister.He reports that he has been smoking cigars. He has never used smokeless tobacco. He reports current alcohol use. He reports current drug use. Drug: Marijuana.    ROS Review of Systems  Constitutional:  Negative for fever.  Respiratory:  Negative for shortness of breath.   Cardiovascular:  Negative for chest pain.  Musculoskeletal:  Negative for arthralgias.  Skin:  Negative for  rash.   Objective:  BP 138/62   Pulse 84   Temp 97.9 F (36.6 C)   Ht 5\' 10"  (1.778 m)   Wt 229 lb 6.4 oz (104.1 kg)   SpO2 97%   BMI 32.92 kg/m   BP Readings from Last 3 Encounters:  09/27/20 138/62  09/06/20 (!) 148/82  09/03/20 (!) 216/92    Wt Readings from Last 3 Encounters:  09/27/20 229 lb 6.4 oz (104.1 kg)  09/06/20 228 lb (103.4 kg)  09/03/20 229 lb (103.9 kg)     Physical Exam Vitals reviewed.  Constitutional:      Appearance: He is well-developed.  HENT:     Head: Normocephalic and atraumatic.     Right Ear: External ear normal.     Left Ear: External ear normal.     Mouth/Throat:     Pharynx: No oropharyngeal exudate or posterior oropharyngeal erythema.  Eyes:     Pupils: Pupils are equal, round, and reactive to light.  Cardiovascular:     Rate and Rhythm: Normal rate and regular rhythm.     Heart sounds: No murmur heard. Pulmonary:     Effort: No respiratory distress.     Breath sounds: Normal breath sounds.  Musculoskeletal:     Cervical back: Normal range of motion and neck supple.  Neurological:     Mental Status: He is alert and oriented to person, place, and time.      Assessment & Plan:   Dalen was seen today for medical management of chronic issues.  Diagnoses  and all orders for this visit:  Primary hypertension      I have discontinued Jesse Mussel C. Frieson Jr.'s metoprolol succinate. I am also having him maintain his aspirin, ELDERBERRY PO, simethicone, Liniments (BLUE-EMU SUPER STRENGTH EX), acetaminophen, Calcium-Magnesium-Zinc (CAL-MAG-ZINC PO), CANNABIDIOL PO, fluticasone, gabapentin, olmesartan, and amLODipine.  Allergies as of 09/27/2020       Reactions   Ciprofloxacin Other (See Comments)   Increased lower extremity weakness   Lipitor [atorvastatin Calcium]    Generic lipitor caused joint pain   Tamiflu [oseltamivir]    Extreme weakness and joint instability        Medication List        Accurate as of September 27, 2020 11:59 PM. If you have any questions, ask your nurse or doctor.          STOP taking these medications    metoprolol succinate 25 MG 24 hr tablet Commonly known as: TOPROL-XL Stopped by: Claretta Fraise, MD       TAKE these medications    acetaminophen 650 MG CR tablet Commonly known as: TYLENOL Take 1,300 mg by mouth in the morning, at noon, and at bedtime.   amLODipine 10 MG tablet Commonly known as: NORVASC Take 1 tablet (10 mg total) by mouth every evening.   aspirin 81 MG EC tablet Commonly known as: Aspirin Adult Low Dose Take 1 tablet (81 mg total) by mouth daily. Swallow whole.   BLUE-EMU SUPER STRENGTH EX Apply 1 application topically 4 (four) times daily as needed (pain.).   CAL-MAG-ZINC PO Take 1 tablet by mouth in the morning and at bedtime.   CANNABIDIOL PO Take 10 drops by mouth in the morning and at bedtime. CBD OIL   ELDERBERRY PO Take 15 mLs by mouth daily. In the morning   fluticasone 50 MCG/ACT nasal spray Commonly known as: FLONASE Place 1-2 sprays into both nostrils daily as needed for allergies or rhinitis (sinus/congestion.).   gabapentin 100 MG capsule Commonly known as: Neurontin Take 2 capsules (200 mg total) by mouth in the morning, at noon, and at bedtime.   olmesartan 40 MG tablet Commonly known as: Benicar Take 1 tablet (40 mg total) by mouth daily. For blood pressure   simethicone 125 MG chewable tablet Commonly known as: MYLICON Chew 235 mg by mouth every 6 (six) hours as needed for flatulence.         Follow-up: Return in about 6 months (around 03/30/2021).  Claretta Fraise, M.D.

## 2020-09-28 ENCOUNTER — Encounter: Payer: Self-pay | Admitting: Family Medicine

## 2020-11-23 ENCOUNTER — Other Ambulatory Visit: Payer: Self-pay | Admitting: Nurse Practitioner

## 2020-11-23 ENCOUNTER — Other Ambulatory Visit: Payer: Self-pay | Admitting: Family Medicine

## 2020-11-23 DIAGNOSIS — I10 Essential (primary) hypertension: Secondary | ICD-10-CM

## 2020-12-01 ENCOUNTER — Telehealth: Payer: Self-pay | Admitting: Family Medicine

## 2020-12-01 NOTE — Telephone Encounter (Signed)
Spoke to patient to schedule Medicare Annual Wellness Visit (AWV) either virtually or in office.   Patient declined to much going on right now death in family   Last AWV 05-23-17  please schedule at anytime with health coach

## 2020-12-16 ENCOUNTER — Encounter: Payer: Self-pay | Admitting: Family Medicine

## 2020-12-16 ENCOUNTER — Other Ambulatory Visit: Payer: Self-pay

## 2020-12-16 ENCOUNTER — Ambulatory Visit (INDEPENDENT_AMBULATORY_CARE_PROVIDER_SITE_OTHER): Payer: Medicare Other | Admitting: Family Medicine

## 2020-12-16 VITALS — BP 132/69 | HR 74 | Temp 97.9°F | Ht 70.0 in | Wt 229.4 lb

## 2020-12-16 DIAGNOSIS — E782 Mixed hyperlipidemia: Secondary | ICD-10-CM | POA: Diagnosis not present

## 2020-12-16 DIAGNOSIS — G5731 Lesion of lateral popliteal nerve, right lower limb: Secondary | ICD-10-CM | POA: Diagnosis not present

## 2020-12-16 DIAGNOSIS — Z23 Encounter for immunization: Secondary | ICD-10-CM | POA: Diagnosis not present

## 2020-12-16 DIAGNOSIS — I10 Essential (primary) hypertension: Secondary | ICD-10-CM

## 2020-12-16 LAB — CMP14+EGFR
ALT: 16 IU/L (ref 0–44)
AST: 22 IU/L (ref 0–40)
Albumin/Globulin Ratio: 2 (ref 1.2–2.2)
Albumin: 4.7 g/dL (ref 3.7–4.7)
Alkaline Phosphatase: 50 IU/L (ref 44–121)
BUN/Creatinine Ratio: 16 (ref 10–24)
BUN: 18 mg/dL (ref 8–27)
Bilirubin Total: 0.4 mg/dL (ref 0.0–1.2)
CO2: 22 mmol/L (ref 20–29)
Calcium: 9.5 mg/dL (ref 8.6–10.2)
Chloride: 103 mmol/L (ref 96–106)
Creatinine, Ser: 1.13 mg/dL (ref 0.76–1.27)
Globulin, Total: 2.3 g/dL (ref 1.5–4.5)
Glucose: 124 mg/dL — ABNORMAL HIGH (ref 70–99)
Potassium: 5 mmol/L (ref 3.5–5.2)
Sodium: 139 mmol/L (ref 134–144)
Total Protein: 7 g/dL (ref 6.0–8.5)
eGFR: 67 mL/min/{1.73_m2} (ref >59)

## 2020-12-16 LAB — CBC WITH DIFFERENTIAL/PLATELET
Basophils Absolute: 0 10*3/uL (ref 0.0–0.2)
Basos: 1 %
EOS (ABSOLUTE): 0.1 10*3/uL (ref 0.0–0.4)
Eos: 3 %
Hematocrit: 42.5 % (ref 37.5–51.0)
Hemoglobin: 14 g/dL (ref 13.0–17.7)
Immature Grans (Abs): 0 10*3/uL (ref 0.0–0.1)
Immature Granulocytes: 0 %
Lymphocytes Absolute: 1.9 10*3/uL (ref 0.7–3.1)
Lymphs: 36 %
MCH: 32.2 pg (ref 26.6–33.0)
MCHC: 32.9 g/dL (ref 31.5–35.7)
MCV: 98 fL — ABNORMAL HIGH (ref 79–97)
Monocytes Absolute: 0.5 10*3/uL (ref 0.1–0.9)
Monocytes: 9 %
Neutrophils Absolute: 2.6 10*3/uL (ref 1.4–7.0)
Neutrophils: 51 %
Platelets: 240 10*3/uL (ref 150–450)
RBC: 4.35 x10E6/uL (ref 4.14–5.80)
RDW: 13.1 % (ref 11.6–15.4)
WBC: 5.1 10*3/uL (ref 3.4–10.8)

## 2020-12-16 LAB — LIPID PANEL
Chol/HDL Ratio: 5.2 ratio — ABNORMAL HIGH (ref 0.0–5.0)
Cholesterol, Total: 202 mg/dL — ABNORMAL HIGH (ref 100–199)
HDL: 39 mg/dL — ABNORMAL LOW (ref >39)
LDL Chol Calc (NIH): 107 mg/dL — ABNORMAL HIGH (ref 0–99)
Triglycerides: 329 mg/dL — ABNORMAL HIGH (ref 0–149)
VLDL Cholesterol Cal: 56 mg/dL — ABNORMAL HIGH (ref 5–40)

## 2020-12-16 MED ORDER — OLMESARTAN MEDOXOMIL 40 MG PO TABS: ORAL_TABLET | ORAL | 2 refills | Status: DC

## 2020-12-16 NOTE — Progress Notes (Signed)
Subjective:  Patient ID: Jesse Poles., male    DOB: 1940-12-16  Age: 80 y.o. MRN: [id number]  CC: Medical Management of Chronic Issues  HPI Jesse Bamber. presents for  follow-up of hypertension. Patient has no history of headache chest pain or shortness of breath or recent cough. Patient also denies symptoms of TIA such as focal numbness or weakness. Patient denies side effects from medication. States taking it regularly.  Ran out of gabapentin. Wasn't effective for him. Has learned to live with the pain.   Trying to do better with diet. Eating a lot of fried food. Appetite is too good.   History Jesse Barton has a past medical history of Arthritis, Cancer ([facility]), History of kidney stones, Hypertension, Kidney stone, and Sciatic nerve pain.   He has a past surgical history that includes Kidney surgery; Eye surgery (Bilateral); Nephrectomy (Left); Cataract extraction; Cystoscopy/ureteroscopy/holmium laser/stent placement (Right, 11/19/2018); Cystoscopy/ureteroscopy/holmium laser/stent placement (Right, 12/17/2018); and Superficial peroneal nerve release (Right, 05/21/2019).   His family history includes Cancer in his mother; Cancer (age of onset: 87) in his sister; Leukemia in his father; Schizophrenia in his sister.He reports that he has been smoking cigars. He has never used smokeless tobacco. He reports current alcohol use. He reports current drug use. Drug: Marijuana.  Current Outpatient Medications on File Prior to Visit  Medication Sig Dispense Refill   acetaminophen (TYLENOL) 650 MG CR tablet Take 1,300 mg by mouth in the morning, at noon, and at bedtime.     amLODipine (NORVASC) 10 MG tablet Take 1 tablet (10 mg total) by mouth every evening. 90 tablet 1   aspirin (ASPIRIN ADULT LOW DOSE) 81 MG EC tablet Take 1 tablet (81 mg total) by mouth daily. Swallow whole. 30 tablet 12   Calcium-Magnesium-Zinc (CAL-MAG-ZINC PO) Take 1 tablet by mouth in the morning and at bedtime.     CANNABIDIOL  PO Take 10 drops by mouth in the morning and at bedtime. CBD OIL     ELDERBERRY PO Take 15 mLs by mouth daily. In the morning     fluticasone (FLONASE) 50 MCG/ACT nasal spray Place 1-2 sprays into both nostrils daily as needed for allergies or rhinitis (sinus/congestion.).     Liniments (BLUE-EMU SUPER STRENGTH EX) Apply 1 application topically 4 (four) times daily as needed (pain.).      simethicone (MYLICON) 125 MG chewable tablet Chew 125 mg by mouth every 6 (six) hours as needed for flatulence.     No current facility-administered medications on file prior to visit.    ROS Review of Systems  Constitutional:  Negative for fever.  Respiratory:  Negative for shortness of breath.   Cardiovascular:  Negative for chest pain.  Musculoskeletal:  Negative for arthralgias.  Skin:  Negative for rash.  Neurological:  Negative for headaches.   Objective:  BP 132/69   Pulse 74   Temp 97.9 F (36.6 C)   Ht 5\' 10"  (1.778 m)   Wt 229 lb 6.4 oz (104.1 kg)   SpO2 98%   BMI 32.92 kg/m   BP Readings from Last 3 Encounters:  12/16/20 132/69  09/27/20 138/62  09/06/20 (!) 148/82    Wt Readings from Last 3 Encounters:  12/16/20 229 lb 6.4 oz (104.1 kg)  09/27/20 229 lb 6.4 oz (104.1 kg)  09/06/20 228 lb (103.4 kg)     Physical Exam Vitals reviewed.  Constitutional:      Appearance: He is well-developed.  HENT:     Head:  Normocephalic and atraumatic.     Right Ear: External ear normal.     Left Ear: External ear normal.     Mouth/Throat:     Pharynx: No oropharyngeal exudate or posterior oropharyngeal erythema.  Eyes:     Pupils: Pupils are equal, round, and reactive to light.  Cardiovascular:     Rate and Rhythm: Normal rate and regular rhythm.     Heart sounds: No murmur heard. Pulmonary:     Effort: No respiratory distress.     Breath sounds: Normal breath sounds.  Musculoskeletal:     Cervical back: Normal range of motion and neck supple.     Comments: Using elevated  walker with arm rests for ambulation   Neurological:     Mental Status: He is alert and oriented to person, place, and time.      Assessment & Plan:   Jesse Barton was seen today for medical management of chronic issues.  Diagnoses and all orders for this visit:  Primary hypertension -     CBC with Differential/Platelet -     CMP14+EGFR  Mixed hyperlipidemia -     Lipid panel  Neuropathy of right peroneal nerve  Other orders -     olmesartan (BENICAR) 40 MG tablet; TAKE ONE TABLET BY MOUTH DAILY FOR BLOOD PRESSURE  Allergies as of 12/16/2020       Reactions   Ciprofloxacin Other (See Comments)   Increased lower extremity weakness   Lipitor [atorvastatin Calcium]    Generic lipitor caused joint pain   Tamiflu [oseltamivir]    Extreme weakness and joint instability        Medication List        Accurate as of December 16, 2020  9:25 AM. If you have any questions, ask your nurse or doctor.          STOP taking these medications    gabapentin 100 MG capsule Commonly known as: Neurontin Stopped by: Claretta Fraise, MD       TAKE these medications    acetaminophen 650 MG CR tablet Commonly known as: TYLENOL Take 1,300 mg by mouth in the morning, at noon, and at bedtime.   amLODipine 10 MG tablet Commonly known as: NORVASC Take 1 tablet (10 mg total) by mouth every evening.   aspirin 81 MG EC tablet Commonly known as: Aspirin Adult Low Dose Take 1 tablet (81 mg total) by mouth daily. Swallow whole.   BLUE-EMU SUPER STRENGTH EX Apply 1 application topically 4 (four) times daily as needed (pain.).   CAL-MAG-ZINC PO Take 1 tablet by mouth in the morning and at bedtime.   CANNABIDIOL PO Take 10 drops by mouth in the morning and at bedtime. CBD OIL   ELDERBERRY PO Take 15 mLs by mouth daily. In the morning   fluticasone 50 MCG/ACT nasal spray Commonly known as: FLONASE Place 1-2 sprays into both nostrils daily as needed for allergies or rhinitis  (sinus/congestion.).   olmesartan 40 MG tablet Commonly known as: BENICAR TAKE ONE TABLET BY MOUTH DAILY FOR BLOOD PRESSURE   simethicone 125 MG chewable tablet Commonly known as: MYLICON Chew 175 mg by mouth every 6 (six) hours as needed for flatulence.        Meds ordered this encounter  Medications   olmesartan (BENICAR) 40 MG tablet    Sig: TAKE ONE TABLET BY MOUTH DAILY FOR BLOOD PRESSURE    Dispense:  90 tablet    Refill:  2  Follow-up: Return in about 6 months (around 06/15/2021).  Claretta Fraise, M.D.

## 2020-12-17 ENCOUNTER — Encounter: Payer: Self-pay | Admitting: *Deleted

## 2021-02-22 ENCOUNTER — Other Ambulatory Visit: Payer: Self-pay | Admitting: Nurse Practitioner

## 2021-02-22 DIAGNOSIS — I10 Essential (primary) hypertension: Secondary | ICD-10-CM

## 2021-05-25 ENCOUNTER — Other Ambulatory Visit: Payer: Self-pay | Admitting: Nurse Practitioner

## 2021-05-25 DIAGNOSIS — I10 Essential (primary) hypertension: Secondary | ICD-10-CM

## 2021-06-15 ENCOUNTER — Encounter: Payer: Self-pay | Admitting: Family Medicine

## 2021-06-15 ENCOUNTER — Ambulatory Visit (INDEPENDENT_AMBULATORY_CARE_PROVIDER_SITE_OTHER): Payer: Medicare Other | Admitting: Family Medicine

## 2021-06-15 VITALS — BP 146/71 | HR 63 | Temp 97.2°F | Ht 70.0 in | Wt 231.4 lb

## 2021-06-15 DIAGNOSIS — Z125 Encounter for screening for malignant neoplasm of prostate: Secondary | ICD-10-CM | POA: Diagnosis not present

## 2021-06-15 DIAGNOSIS — E782 Mixed hyperlipidemia: Secondary | ICD-10-CM | POA: Diagnosis not present

## 2021-06-15 DIAGNOSIS — I10 Essential (primary) hypertension: Secondary | ICD-10-CM

## 2021-06-15 MED ORDER — OLMESARTAN MEDOXOMIL-HCTZ 40-25 MG PO TABS: 1.0000 | ORAL_TABLET | Freq: Every day | ORAL | 3 refills | Status: DC

## 2021-06-15 MED ORDER — AMLODIPINE BESYLATE 10 MG PO TABS: 10.0000 mg | ORAL_TABLET | Freq: Every evening | ORAL | 2 refills | Status: DC

## 2021-06-15 NOTE — Progress Notes (Signed)
? ?Subjective:  ?Patient ID: Jesse Barton., male    DOB: Feb 28, 1941  Age: 81 y.o. MRN: [id number] ? ?CC: Medical Management of Chronic Issues ? ? ?HPI ?Jesse Barton. presents for  follow-up of hypertension. Patient has no history of headache chest pain or shortness of breath or recent cough. Patient also denies symptoms of TIA such as focal numbness or weakness. Patient denies side effects from medication. States taking it regularly. ?Symptoms include congestion, facial pain, green nasal discharge, no cough, post nasal drip and sinus pressure. There is no fever, chills, or sweats. Onset of symptoms was a few days ago, gradually worsening since that time.  ? ? ?Eating regular diet. Not low fat. Still eating bacon, eggs, fried steak, etc.  ? ? ?History ?Sacha has a past medical history of Arthritis, Cancer ([place]), History of kidney stones, Hypertension, Kidney stone, and Sciatic nerve pain.  ? ?He has a past surgical history that includes Kidney surgery; Eye surgery (Bilateral); Nephrectomy (Left); Cataract extraction; Cystoscopy/ureteroscopy/holmium laser/stent placement (Right, 11/19/2018); Cystoscopy/ureteroscopy/holmium laser/stent placement (Right, 12/17/2018); and Superficial peroneal nerve release (Right, 05/21/2019).  ? ?His family history includes Cancer in his mother; Cancer (age of onset: 15) in his sister; Leukemia in his father; Schizophrenia in his sister.He reports that he has been smoking cigars. He has never used smokeless tobacco. He reports current alcohol use. He reports current drug use. Drug: Marijuana. ? ?Current Outpatient Medications on File Prior to Visit  ?Medication Sig Dispense Refill  ? acetaminophen (TYLENOL) 650 MG CR tablet Take 1,300 mg by mouth in the morning, at noon, and at bedtime.    ? aspirin (ASPIRIN ADULT LOW DOSE) 81 MG EC tablet Take 1 tablet (81 mg total) by mouth daily. Swallow whole. 30 tablet 12  ? Calcium-Magnesium-Zinc (CAL-MAG-ZINC PO) Take 1 tablet by mouth in the  morning and at bedtime.    ? CANNABIDIOL PO Take 10 drops by mouth in the morning and at bedtime. CBD OIL    ? ELDERBERRY PO Take 15 mLs by mouth daily. In the morning    ? fluticasone (FLONASE) 50 MCG/ACT nasal spray Place 1-2 sprays into both nostrils daily as needed for allergies or rhinitis (sinus/congestion.).    ? Liniments (BLUE-EMU SUPER STRENGTH EX) Apply 1 application topically 4 (four) times daily as needed (pain.).     ? simethicone (MYLICON) 229 MG chewable tablet Chew 125 mg by mouth every 6 (six) hours as needed for flatulence.    ? ?No current facility-administered medications on file prior to visit.  ? ? ?ROS ?Review of Systems ? ?Objective:  ?BP (!) 146/71   Pulse 63   Temp (!) 97.2 ?F (36.2 ?C)   Ht _0  (1.778 m)   Wt 231 lb 6.4 oz (105 kg)   SpO2 97%   BMI 33.20 kg/m?  ? ?BP Readings from Last 3 Encounters:  ?06/15/21 (!) 146/71  ?12/16/20 132/69  ?09/27/20 138/62  ? ? ?Wt Readings from Last 3 Encounters:  ?06/15/21 231 lb 6.4 oz (105 kg)  ?12/16/20 229 lb 6.4 oz (104.1 kg)  ?09/27/20 229 lb 6.4 oz (104.1 kg)  ? ? ? ?Physical Exam ? ? ? ?Assessment & Plan:  ? ?Vir was seen today for medical management of chronic issues. ? ?Diagnoses and all orders for this visit: ? ?Primary hypertension ?-     CBC with Differential/Platelet ?-     CMP14+EGFR ?-     amLODipine (NORVASC) 10 MG tablet; Take 1 tablet (10  mg total) by mouth every evening. ? ?Mixed hyperlipidemia ?-     Lipid panel ? ?Prostate cancer screening ?-     PSA, total and free ? ?Other orders ?-     olmesartan-hydrochlorothiazide (BENICAR HCT) 40-25 MG tablet; Take 1 tablet by mouth daily. ? ? ?Allergies as of 06/15/2021   ? ?   Reactions  ? Ciprofloxacin Other (See Comments)  ? Increased lower extremity weakness  ? Lipitor [atorvastatin Calcium]   ? Generic lipitor caused joint pain  ? Tamiflu [oseltamivir]   ? Extreme weakness and joint instability  ? ?  ? ?  ?Medication List  ?  ? ?  ? Accurate as of June 15, 2021  9:22 AM. If  you have any questions, ask your nurse or doctor.  ?  ?  ? ?  ? ?STOP taking these medications   ? ?olmesartan 40 MG tablet ?Commonly known as: BENICAR ?Stopped by: Claretta Fraise, MD ?  ? ?  ? ?TAKE these medications   ? ?acetaminophen 650 MG CR tablet ?Commonly known as: TYLENOL ?Take 1,300 mg by mouth in the morning, at noon, and at bedtime. ?  ?amLODipine 10 MG tablet ?Commonly known as: NORVASC ?Take 1 tablet (10 mg total) by mouth every evening. ?  ?aspirin 81 MG EC tablet ?Commonly known as: Aspirin Adult Low Dose ?Take 1 tablet (81 mg total) by mouth daily. Swallow whole. ?  ?BLUE-EMU SUPER STRENGTH EX ?Apply 1 application topically 4 (four) times daily as needed (pain.). ?  ?CAL-MAG-ZINC PO ?Take 1 tablet by mouth in the morning and at bedtime. ?  ?CANNABIDIOL PO ?Take 10 drops by mouth in the morning and at bedtime. CBD OIL ?  ?ELDERBERRY PO ?Take 15 mLs by mouth daily. In the morning ?  ?fluticasone 50 MCG/ACT nasal spray ?Commonly known as: FLONASE ?Place 1-2 sprays into both nostrils daily as needed for allergies or rhinitis (sinus/congestion.). ?  ?olmesartan-hydrochlorothiazide 40-25 MG tablet ?Commonly known as: Benicar HCT ?Take 1 tablet by mouth daily. ?Started by: Claretta Fraise, MD ?  ?simethicone 125 MG chewable tablet ?Commonly known as: MYLICON ?Chew 125 mg by mouth every 6 (six) hours as needed for flatulence. ?  ? ?  ? ? ?Meds ordered this encounter  ?Medications  ? amLODipine (NORVASC) 10 MG tablet  ?  Sig: Take 1 tablet (10 mg total) by mouth every evening.  ?  Dispense:  90 tablet  ?  Refill:  2  ? olmesartan-hydrochlorothiazide (BENICAR HCT) 40-25 MG tablet  ?  Sig: Take 1 tablet by mouth daily.  ?  Dispense:  90 tablet  ?  Refill:  3  ? ? ?Consider med for triglycerides if level high again on today's test ? ?Follow-up: Return in about 6 months (around 12/16/2021). ? ?Claretta Fraise, M.D. ?

## 2021-06-16 ENCOUNTER — Telehealth: Payer: Self-pay | Admitting: Family Medicine

## 2021-06-16 LAB — CMP14+EGFR
ALT: 19 IU/L (ref 0–44)
AST: 24 IU/L (ref 0–40)
Albumin/Globulin Ratio: 2.6 — ABNORMAL HIGH (ref 1.2–2.2)
Albumin: 4.9 g/dL — ABNORMAL HIGH (ref 3.7–4.7)
Alkaline Phosphatase: 56 IU/L (ref 44–121)
BUN/Creatinine Ratio: 13 (ref 10–24)
BUN: 14 mg/dL (ref 8–27)
Bilirubin Total: 0.4 mg/dL (ref 0.0–1.2)
CO2: 23 mmol/L (ref 20–29)
Calcium: 9.7 mg/dL (ref 8.6–10.2)
Chloride: 101 mmol/L (ref 96–106)
Creatinine, Ser: 1.08 mg/dL (ref 0.76–1.27)
Globulin, Total: 1.9 g/dL (ref 1.5–4.5)
Glucose: 121 mg/dL — ABNORMAL HIGH (ref 70–99)
Potassium: 4.4 mmol/L (ref 3.5–5.2)
Sodium: 138 mmol/L (ref 134–144)
Total Protein: 6.8 g/dL (ref 6.0–8.5)
eGFR: 70 mL/min/{1.73_m2} (ref >59)

## 2021-06-16 LAB — LIPID PANEL
Chol/HDL Ratio: 5.5 ratio — ABNORMAL HIGH (ref 0.0–5.0)
Cholesterol, Total: 208 mg/dL — ABNORMAL HIGH (ref 100–199)
HDL: 38 mg/dL — ABNORMAL LOW (ref >39)
LDL Chol Calc (NIH): 109 mg/dL — ABNORMAL HIGH (ref 0–99)
Triglycerides: 352 mg/dL — ABNORMAL HIGH (ref 0–149)
VLDL Cholesterol Cal: 61 mg/dL — ABNORMAL HIGH (ref 5–40)

## 2021-06-16 LAB — CBC WITH DIFFERENTIAL/PLATELET
Basophils Absolute: 0.1 10*3/uL (ref 0.0–0.2)
Basos: 1 %
EOS (ABSOLUTE): 0.2 10*3/uL (ref 0.0–0.4)
Eos: 4 %
Hematocrit: 42.4 % (ref 37.5–51.0)
Hemoglobin: 14.6 g/dL (ref 13.0–17.7)
Immature Grans (Abs): 0 10*3/uL (ref 0.0–0.1)
Immature Granulocytes: 0 %
Lymphocytes Absolute: 1.8 10*3/uL (ref 0.7–3.1)
Lymphs: 34 %
MCH: 33.1 pg — ABNORMAL HIGH (ref 26.6–33.0)
MCHC: 34.4 g/dL (ref 31.5–35.7)
MCV: 96 fL (ref 79–97)
Monocytes Absolute: 0.5 10*3/uL (ref 0.1–0.9)
Monocytes: 9 %
Neutrophils Absolute: 2.8 10*3/uL (ref 1.4–7.0)
Neutrophils: 52 %
Platelets: 229 10*3/uL (ref 150–450)
RBC: 4.41 x10E6/uL (ref 4.14–5.80)
RDW: 12.7 % (ref 11.6–15.4)
WBC: 5.3 10*3/uL (ref 3.4–10.8)

## 2021-06-16 LAB — PSA, TOTAL AND FREE
PSA, Free Pct: 20 %
PSA, Free: 0.2 ng/mL
Prostate Specific Ag, Serum: 1 ng/mL (ref 0.0–4.0)

## 2021-06-16 MED ORDER — AMOXICILLIN-POT CLAVULANATE 875-125 MG PO TABS: 1.0000 | ORAL_TABLET | Freq: Two times a day (BID) | ORAL | 0 refills | Status: DC

## 2021-06-16 NOTE — Telephone Encounter (Signed)
FYI- about medication  ? ?No abx was sent yesterday- Stacks is off  ?Will send to covering to advise but may need to wait till tomorrow when PCP returns   ?

## 2021-06-16 NOTE — Telephone Encounter (Signed)
Pt has completely stopped taking hydrochlorothiazide because he did not like how much he was urinating. Send in amoxicillin per dr. g ?

## 2021-06-16 NOTE — Telephone Encounter (Signed)
Suspect frequent urination will continue for the first few days on the HCTZ.  This is NORMAL and will subside. ?Ok to send Augmentin '875mg'$  BID x10days. ?

## 2021-06-17 ENCOUNTER — Other Ambulatory Visit: Payer: Self-pay | Admitting: Family Medicine

## 2021-06-17 MED ORDER — OLMESARTAN MEDOXOMIL 40 MG PO TABS: 40.0000 mg | ORAL_TABLET | Freq: Every day | ORAL | 1 refills | Status: DC

## 2021-06-17 MED ORDER — FENOFIBRATE 160 MG PO TABS: 160.0000 mg | ORAL_TABLET | Freq: Every day | ORAL | 3 refills | Status: DC

## 2021-06-17 NOTE — Telephone Encounter (Signed)
Called and notified

## 2021-06-17 NOTE — Telephone Encounter (Signed)
I sent in benicar without the HCTZ. Please tell Jesse Barton ? ?

## 2021-12-19 ENCOUNTER — Encounter: Payer: Self-pay | Admitting: Family Medicine

## 2021-12-19 ENCOUNTER — Ambulatory Visit: Payer: Medicare Other | Admitting: Family Medicine

## 2021-12-19 ENCOUNTER — Ambulatory Visit (INDEPENDENT_AMBULATORY_CARE_PROVIDER_SITE_OTHER): Payer: Medicare Other | Admitting: Family Medicine

## 2021-12-19 VITALS — BP 147/79 | HR 77 | Temp 97.7°F | Ht 70.0 in | Wt 221.8 lb

## 2021-12-19 DIAGNOSIS — Z23 Encounter for immunization: Secondary | ICD-10-CM | POA: Diagnosis not present

## 2021-12-19 DIAGNOSIS — E782 Mixed hyperlipidemia: Secondary | ICD-10-CM

## 2021-12-19 DIAGNOSIS — I10 Essential (primary) hypertension: Secondary | ICD-10-CM | POA: Diagnosis not present

## 2021-12-19 MED ORDER — OLMESARTAN MEDOXOMIL 40 MG PO TABS: 40.0000 mg | ORAL_TABLET | Freq: Every day | ORAL | 3 refills | Status: DC

## 2021-12-19 MED ORDER — METOPROLOL SUCCINATE ER 25 MG PO TB24: 25.0000 mg | ORAL_TABLET | Freq: Every day | ORAL | 3 refills | Status: DC

## 2021-12-19 NOTE — Progress Notes (Signed)
Subjective:  Patient ID: Jesse Mane., male    DOB: 03/24/1940  Age: 81 y.o. MRN: [id number]  CC: Medical Management of Chronic Issues   HPI Kenner Lewan. presents for working on reestablishing his ability to walk. Riding a stationery bike for exercise.    presents for  follow-up of hypertension. Patient has no history of headache chest pain or shortness of breath or recent cough. Patient also denies symptoms of TIA such as focal numbness or weakness. Patient denies side effects from medication. States taking it regularly.      06/15/2021    8:59 AM 12/16/2020    8:53 AM 09/27/2020    9:53 AM  Depression screen PHQ 2/9  Decreased Interest 0 0 0  Down, Depressed, Hopeless  0 0  PHQ - 2 Score 0 0 0  Altered sleeping   0  Tired, decreased energy   2  Change in appetite   2  Feeling bad or failure about yourself    0  Trouble concentrating   0  Moving slowly or fidgety/restless   0  Suicidal thoughts   0  PHQ-9 Score   4  Difficult doing work/chores   Not difficult at all    History Yoniel has a past medical history of Arthritis, Cancer ([place]), History of kidney stones, Hypertension, Kidney stone, and Sciatic nerve pain.   He has a past surgical history that includes Kidney surgery; Eye surgery (Bilateral); Nephrectomy (Left); Cataract extraction; Cystoscopy/ureteroscopy/holmium laser/stent placement (Right, 11/19/2018); Cystoscopy/ureteroscopy/holmium laser/stent placement (Right, 12/17/2018); and Superficial peroneal nerve release (Right, 05/21/2019).   His family history includes Cancer in his mother; Cancer (age of onset: 72) in his sister; Leukemia in his father; Schizophrenia in his sister.He reports that he has been smoking cigars. He has never used smokeless tobacco. He reports current alcohol use. He reports current drug use. Drug: Marijuana.    ROS Review of Systems  Constitutional:  Negative for fever.  Respiratory:  Negative for shortness of breath.    Cardiovascular:  Negative for chest pain.  Musculoskeletal:  Negative for arthralgias.  Skin:  Negative for rash.    Objective:  BP (!) 147/79   Pulse 77   Temp 97.7 F (36.5 C)   Ht $R'5\' 10"'Sr$  (1.778 m)   Wt 221 lb 12.8 oz (100.6 kg)   SpO2 97%   BMI 31.82 kg/m   BP Readings from Last 3 Encounters:  12/19/21 (!) 147/79  06/15/21 (!) 146/71  12/16/20 132/69    Wt Readings from Last 3 Encounters:  12/19/21 221 lb 12.8 oz (100.6 kg)  06/15/21 231 lb 6.4 oz (105 kg)  12/16/20 229 lb 6.4 oz (104.1 kg)     Physical Exam Vitals reviewed.  Constitutional:      Appearance: He is well-developed.  HENT:     Head: Normocephalic and atraumatic.     Right Ear: External ear normal.     Left Ear: External ear normal.     Mouth/Throat:     Pharynx: No oropharyngeal exudate or posterior oropharyngeal erythema.  Eyes:     Pupils: Pupils are equal, round, and reactive to light.  Cardiovascular:     Rate and Rhythm: Normal rate and regular rhythm.     Heart sounds: No murmur heard. Pulmonary:     Effort: No respiratory distress.     Breath sounds: Normal breath sounds.  Musculoskeletal:     Cervical back: Normal range of motion and neck supple.  Comments: Braces at lower legs and ankles. Using walker  Neurological:     Mental Status: He is alert and oriented to person, place, and time.      Assessment & Plan:   Grace was seen today for medical management of chronic issues.  Diagnoses and all orders for this visit:  Mixed hyperlipidemia -     CBC with Differential/Platelet -     CMP14+EGFR -     Lipid panel  Primary hypertension -     CBC with Differential/Platelet -     CMP14+EGFR -     Lipid panel  Need for immunization against influenza -     Flu Vaccine QUAD High Dose(Fluad)  Other orders -     olmesartan (BENICAR) 40 MG tablet; Take 1 tablet (40 mg total) by mouth daily. For blood pressure -     metoprolol succinate (TOPROL-XL) 25 MG 24 hr tablet; Take 1  tablet (25 mg total) by mouth daily. For heart and blood pressure       I have discontinued Mallie Mussel C. Swicegood Jr.'s amoxicillin-clavulanate. I am also having him start on metoprolol succinate. Additionally, I am having him maintain his aspirin EC, ELDERBERRY PO, simethicone, Liniments (BLUE-EMU SUPER STRENGTH EX), acetaminophen, Calcium-Magnesium-Zinc (CAL-MAG-ZINC PO), CANNABIDIOL PO, fluticasone, amLODipine, fenofibrate, and olmesartan.  Allergies as of 12/19/2021       Reactions   Hydrochlorothiazide    Constant urination   Ciprofloxacin Other (See Comments)   Increased lower extremity weakness   Lipitor [atorvastatin Calcium]    Generic lipitor caused joint pain   Tamiflu [oseltamivir]    Extreme weakness and joint instability        Medication List        Accurate as of December 19, 2021  3:39 PM. If you have any questions, ask your nurse or doctor.          STOP taking these medications    amoxicillin-clavulanate 875-125 MG tablet Commonly known as: AUGMENTIN Stopped by: Claretta Fraise, MD       TAKE these medications    acetaminophen 650 MG CR tablet Commonly known as: TYLENOL Take 1,300 mg by mouth in the morning, at noon, and at bedtime.   amLODipine 10 MG tablet Commonly known as: NORVASC Take 1 tablet (10 mg total) by mouth every evening.   aspirin EC 81 MG tablet Commonly known as: Aspirin Adult Low Dose Take 1 tablet (81 mg total) by mouth daily. Swallow whole.   BLUE-EMU SUPER STRENGTH EX Apply 1 application topically 4 (four) times daily as needed (pain.).   CAL-MAG-ZINC PO Take 1 tablet by mouth in the morning and at bedtime.   CANNABIDIOL PO Take 10 drops by mouth in the morning and at bedtime. CBD OIL   ELDERBERRY PO Take 15 mLs by mouth daily. In the morning   fenofibrate 160 MG tablet Take 1 tablet (160 mg total) by mouth daily. For cholesterol and triglyceride   fluticasone 50 MCG/ACT nasal spray Commonly known as: FLONASE Place  1-2 sprays into both nostrils daily as needed for allergies or rhinitis (sinus/congestion.).   metoprolol succinate 25 MG 24 hr tablet Commonly known as: TOPROL-XL Take 1 tablet (25 mg total) by mouth daily. For heart and blood pressure Started by: Claretta Fraise, MD   olmesartan 40 MG tablet Commonly known as: Benicar Take 1 tablet (40 mg total) by mouth daily. For blood pressure   simethicone 125 MG chewable tablet Commonly known as: MYLICON Chew 704 mg by mouth  every 6 (six) hours as needed for flatulence.         Follow-up: Return in about 6 months (around 06/20/2022).  Claretta Fraise, M.D.

## 2021-12-20 LAB — CMP14+EGFR
ALT: 16 IU/L (ref 0–44)
AST: 24 IU/L (ref 0–40)
Albumin/Globulin Ratio: 2.4 — ABNORMAL HIGH (ref 1.2–2.2)
Albumin: 4.8 g/dL (ref 3.8–4.8)
Alkaline Phosphatase: 42 IU/L — ABNORMAL LOW (ref 44–121)
BUN/Creatinine Ratio: 10 (ref 10–24)
BUN: 11 mg/dL (ref 8–27)
Bilirubin Total: 0.4 mg/dL (ref 0.0–1.2)
CO2: 23 mmol/L (ref 20–29)
Calcium: 9.6 mg/dL (ref 8.6–10.2)
Chloride: 103 mmol/L (ref 96–106)
Creatinine, Ser: 1.11 mg/dL (ref 0.76–1.27)
Globulin, Total: 2 g/dL (ref 1.5–4.5)
Glucose: 91 mg/dL (ref 70–99)
Potassium: 4.5 mmol/L (ref 3.5–5.2)
Sodium: 143 mmol/L (ref 134–144)
Total Protein: 6.8 g/dL (ref 6.0–8.5)
eGFR: 68 mL/min/{1.73_m2} (ref >59)

## 2021-12-20 LAB — CBC WITH DIFFERENTIAL/PLATELET
Basophils Absolute: 0.1 10*3/uL (ref 0.0–0.2)
Basos: 1 %
EOS (ABSOLUTE): 0.2 10*3/uL (ref 0.0–0.4)
Eos: 2 %
Hematocrit: 41.8 % (ref 37.5–51.0)
Hemoglobin: 14.2 g/dL (ref 13.0–17.7)
Immature Grans (Abs): 0 10*3/uL (ref 0.0–0.1)
Immature Granulocytes: 0 %
Lymphocytes Absolute: 2.4 10*3/uL (ref 0.7–3.1)
Lymphs: 35 %
MCH: 32.6 pg (ref 26.6–33.0)
MCHC: 34 g/dL (ref 31.5–35.7)
MCV: 96 fL (ref 79–97)
Monocytes Absolute: 0.5 10*3/uL (ref 0.1–0.9)
Monocytes: 8 %
Neutrophils Absolute: 3.8 10*3/uL (ref 1.4–7.0)
Neutrophils: 54 %
Platelets: 247 10*3/uL (ref 150–450)
RBC: 4.35 x10E6/uL (ref 4.14–5.80)
RDW: 13 % (ref 11.6–15.4)
WBC: 7 10*3/uL (ref 3.4–10.8)

## 2021-12-20 LAB — LIPID PANEL
Chol/HDL Ratio: 4.9 ratio (ref 0.0–5.0)
Cholesterol, Total: 205 mg/dL — ABNORMAL HIGH (ref 100–199)
HDL: 42 mg/dL (ref >39)
LDL Chol Calc (NIH): 110 mg/dL — ABNORMAL HIGH (ref 0–99)
Triglycerides: 306 mg/dL — ABNORMAL HIGH (ref 0–149)
VLDL Cholesterol Cal: 53 mg/dL — ABNORMAL HIGH (ref 5–40)

## 2022-03-15 ENCOUNTER — Other Ambulatory Visit: Payer: Self-pay | Admitting: Family Medicine

## 2022-04-12 ENCOUNTER — Other Ambulatory Visit: Payer: Self-pay | Admitting: Family Medicine

## 2022-04-12 DIAGNOSIS — I10 Essential (primary) hypertension: Secondary | ICD-10-CM

## 2022-06-15 ENCOUNTER — Other Ambulatory Visit: Payer: Self-pay | Admitting: Family Medicine

## 2022-06-15 DIAGNOSIS — I10 Essential (primary) hypertension: Secondary | ICD-10-CM

## 2022-06-20 ENCOUNTER — Ambulatory Visit: Payer: Medicare Other | Admitting: Family Medicine

## 2022-06-27 ENCOUNTER — Ambulatory Visit (INDEPENDENT_AMBULATORY_CARE_PROVIDER_SITE_OTHER): Payer: Medicare Other | Admitting: Family Medicine

## 2022-06-27 ENCOUNTER — Encounter: Payer: Self-pay | Admitting: Family Medicine

## 2022-06-27 VITALS — BP 143/80 | HR 88 | Temp 97.9°F | Ht 70.0 in | Wt 223.2 lb

## 2022-06-27 DIAGNOSIS — I10 Essential (primary) hypertension: Secondary | ICD-10-CM

## 2022-06-27 DIAGNOSIS — J0100 Acute maxillary sinusitis, unspecified: Secondary | ICD-10-CM | POA: Diagnosis not present

## 2022-06-27 DIAGNOSIS — M5441 Lumbago with sciatica, right side: Secondary | ICD-10-CM

## 2022-06-27 DIAGNOSIS — E782 Mixed hyperlipidemia: Secondary | ICD-10-CM

## 2022-06-27 DIAGNOSIS — G8929 Other chronic pain: Secondary | ICD-10-CM

## 2022-06-27 MED ORDER — PSEUDOEPHEDRINE-GUAIFENESIN ER 60-600 MG PO TB12: 1.0000 | ORAL_TABLET | Freq: Two times a day (BID) | ORAL | 0 refills | Status: AC

## 2022-06-27 MED ORDER — FENOFIBRATE 160 MG PO TABS: 160.0000 mg | ORAL_TABLET | Freq: Every day | ORAL | 3 refills | Status: DC

## 2022-06-27 MED ORDER — PREGABALIN 75 MG PO CAPS: 75.0000 mg | ORAL_CAPSULE | Freq: Every day | ORAL | 1 refills | Status: DC

## 2022-06-27 MED ORDER — AMOXICILLIN-POT CLAVULANATE 875-125 MG PO TABS: 1.0000 | ORAL_TABLET | Freq: Two times a day (BID) | ORAL | 0 refills | Status: DC

## 2022-06-27 MED ORDER — METOPROLOL SUCCINATE ER 50 MG PO TB24: 50.0000 mg | ORAL_TABLET | Freq: Every day | ORAL | 1 refills | Status: DC

## 2022-06-27 NOTE — Progress Notes (Signed)
Subjective:  Patient ID: Jesse Barton., male    DOB: 1941/02/03  Age: 82 y.o. MRN: [id number]  CC: Medical Management of Chronic Issues   HPI Aldred Esquibel. presents for  follow-up of hypertension. Patient has no history of headache chest pain or shortness of breath or recent cough. Patient also denies symptoms of TIA such as focal numbness or weakness. Patient denies side effects from medication. States taking it regularly.   in for follow-up of elevated cholesterol. Doing well without complaints on current medication. Denies side effects of statin including myalgia and arthralgia and nausea. Currently no chest pain, shortness of breath or other cardiovascular related symptoms noted.   History Anne has a past medical history of Arthritis, Cancer, History of kidney stones, History of nephrolithiasis (10/21/2018), Hypertension, Kidney stone, and Sciatic nerve pain.   He has a past surgical history that includes Kidney surgery; Eye surgery (Bilateral); Nephrectomy (Left); Cataract extraction; Cystoscopy/ureteroscopy/holmium laser/stent placement (Right, 11/19/2018); Cystoscopy/ureteroscopy/holmium laser/stent placement (Right, 12/17/2018); and Superficial peroneal nerve release (Right, 05/21/2019).   His family history includes Cancer in his mother; Cancer (age of onset: 67) in his sister; Leukemia in his father; Schizophrenia in his sister.He reports that he has been smoking cigars. He has never used smokeless tobacco. He reports current alcohol use. He reports current drug use. Drug: Marijuana.  Current Outpatient Medications on File Prior to Visit  Medication Sig Dispense Refill   acetaminophen (TYLENOL) 650 MG CR tablet Take 1,300 mg by mouth in the morning, at noon, and at bedtime.     amLODipine (NORVASC) 10 MG tablet TAKE ONE TABLET BY MOUTH EVERY EVENING. 90 tablet 0   aspirin (ASPIRIN ADULT LOW DOSE) 81 MG EC tablet Take 1 tablet (81 mg total) by mouth daily. Swallow whole. 30 tablet  12   Calcium-Magnesium-Zinc (CAL-MAG-ZINC PO) Take 1 tablet by mouth in the morning and at bedtime.     CANNABIDIOL PO Take 10 drops by mouth in the morning and at bedtime. CBD OIL     ELDERBERRY PO Take 15 mLs by mouth daily. In the morning     fluticasone (FLONASE) 50 MCG/ACT nasal spray Place 1-2 sprays into both nostrils daily as needed for allergies or rhinitis (sinus/congestion.).     Liniments (BLUE-EMU SUPER STRENGTH EX) Apply 1 application topically 4 (four) times daily as needed (pain.).      olmesartan (BENICAR) 40 MG tablet Take 1 tablet (40 mg total) by mouth daily. For blood pressure 90 tablet 3   simethicone (MYLICON) 125 MG chewable tablet Chew 125 mg by mouth every 6 (six) hours as needed for flatulence.     No current facility-administered medications on file prior to visit.    ROS Review of Systems  Constitutional:  Negative for activity change, appetite change, chills and fever.  HENT:  Positive for congestion, ear pain (right feels clogged), postnasal drip, rhinorrhea (green) and sinus pressure (right cheek and aroundeye). Negative for ear discharge, hearing loss, sneezing and trouble swallowing.   Respiratory:  Negative for cough, chest tightness and shortness of breath.   Cardiovascular:  Negative for chest pain and palpitations.  Musculoskeletal:  Negative for arthralgias.  Skin:  Negative for rash.    Objective:  BP (!) 143/80   Pulse 88   Temp 97.9 F (36.6 C)   Ht 5\' 10"  (1.778 m)   Wt 223 lb 3.2 oz (101.2 kg)   SpO2 96%   BMI 32.03 kg/m   BP Readings from Last  3 Encounters:  06/27/22 (!) 143/80  12/19/21 (!) 147/79  06/15/21 (!) 146/71    Wt Readings from Last 3 Encounters:  06/27/22 223 lb 3.2 oz (101.2 kg)  12/19/21 221 lb 12.8 oz (100.6 kg)  06/15/21 231 lb 6.4 oz (105 kg)     Physical Exam Vitals reviewed.  Constitutional:      Appearance: He is well-developed.  HENT:     Head: Normocephalic and atraumatic.     Right Ear: Tympanic  membrane and external ear normal. No decreased hearing noted. Tympanic membrane is not injected.     Left Ear: Tympanic membrane and external ear normal. No decreased hearing noted. Tympanic membrane is not injected.     Nose: Mucosal edema, congestion and rhinorrhea present.     Right Sinus: No frontal sinus tenderness.     Left Sinus: No frontal sinus tenderness.     Mouth/Throat:     Mouth: Mucous membranes are moist.     Pharynx: No oropharyngeal exudate or posterior oropharyngeal erythema.  Eyes:     Pupils: Pupils are equal, round, and reactive to light.  Neck:     Meningeal: Brudzinski's sign absent.  Cardiovascular:     Rate and Rhythm: Normal rate and regular rhythm.     Heart sounds: No murmur heard. Pulmonary:     Effort: No respiratory distress.     Breath sounds: Normal breath sounds.  Musculoskeletal:     Cervical back: Normal range of motion and neck supple.  Lymphadenopathy:     Head:     Right side of head: No preauricular adenopathy.     Left side of head: No preauricular adenopathy.     Cervical:     Right cervical: No superficial cervical adenopathy.    Left cervical: No superficial cervical adenopathy.  Neurological:     Mental Status: He is alert and oriented to person, place, and time.       Assessment & Plan:   Sherilyn CooterHenry was seen today for medical management of chronic issues.  Diagnoses and all orders for this visit:  Primary hypertension -     CBC with Differential/Platelet -     CMP14+EGFR  Mixed hyperlipidemia -     Lipid panel  Chronic midline low back pain with right-sided sciatica  Acute maxillary sinusitis, recurrence not specified  Other orders -     fenofibrate 160 MG tablet; Take 1 tablet (160 mg total) by mouth daily. For cholesterol and triglyceride -     metoprolol succinate (TOPROL-XL) 50 MG 24 hr tablet; Take 1 tablet (50 mg total) by mouth daily. For heart and blood pressure -     pregabalin (LYRICA) 75 MG capsule; Take 1  capsule (75 mg total) by mouth at bedtime. -     amoxicillin-clavulanate (AUGMENTIN) 875-125 MG tablet; Take 1 tablet by mouth 2 (two) times daily. Take all of this medication -     pseudoephedrine-guaifenesin (MUCINEX D) 60-600 MG 12 hr tablet; Take 1 tablet by mouth every 12 (twelve) hours for 14 days. As needed for congestion   Allergies as of 06/27/2022       Reactions   Hydrochlorothiazide    Constant urination   Ciprofloxacin Other (See Comments)   Increased lower extremity weakness   Lipitor [atorvastatin Calcium]    Generic lipitor caused joint pain   Tamiflu [oseltamivir]    Extreme weakness and joint instability        Medication List  Accurate as of June 27, 2022 11:59 AM. If you have any questions, ask your nurse or doctor.          acetaminophen 650 MG CR tablet Commonly known as: TYLENOL Take 1,300 mg by mouth in the morning, at noon, and at bedtime.   amLODipine 10 MG tablet Commonly known as: NORVASC TAKE ONE TABLET BY MOUTH EVERY EVENING.   amoxicillin-clavulanate 875-125 MG tablet Commonly known as: AUGMENTIN Take 1 tablet by mouth 2 (two) times daily. Take all of this medication Started by: [name], MD   aspirin EC 81 MG tablet Commonly known as: Aspirin Adult Low Dose Take 1 tablet (81 mg total) by mouth daily. Swallow whole.   BLUE-EMU SUPER STRENGTH EX Apply 1 application topically 4 (four) times daily as needed (pain.).   CAL-MAG-ZINC PO Take 1 tablet by mouth in the morning and at bedtime.   CANNABIDIOL PO Take 10 drops by mouth in the morning and at bedtime. CBD OIL   ELDERBERRY PO Take 15 mLs by mouth daily. In the morning   fenofibrate 160 MG tablet Take 1 tablet (160 mg total) by mouth daily. For cholesterol and triglyceride   fluticasone 50 MCG/ACT nasal spray Commonly known as: FLONASE Place 1-2 sprays into both nostrils daily as needed for allergies or rhinitis (sinus/congestion.).   metoprolol succinate 50 MG  24 hr tablet Commonly known as: TOPROL-XL Take 1 tablet (50 mg total) by mouth daily. For heart and blood pressure What changed:  medication strength how much to take Changed by: [name], MD   olmesartan 40 MG tablet Commonly known as: Benicar Take 1 tablet (40 mg total) by mouth daily. For blood pressure   pregabalin 75 MG capsule Commonly known as: Lyrica Take 1 capsule (75 mg total) by mouth at bedtime. Started by: [name], MD   pseudoephedrine-guaifenesin 60-600 MG 12 hr tablet Commonly known as: Mucinex D Take 1 tablet by mouth every 12 (twelve) hours for 14 days. As needed for congestion Started by: [name], MD   simethicone 125 MG chewable tablet Commonly known as: MYLICON Chew 125 mg by mouth every 6 (six) hours as needed for flatulence.        Meds ordered this encounter  Medications   fenofibrate 160 MG tablet    Sig: Take 1 tablet (160 mg total) by mouth daily. For cholesterol and triglyceride    Dispense:  90 tablet    Refill:  3   metoprolol succinate (TOPROL-XL) 50 MG 24 hr tablet    Sig: Take 1 tablet (50 mg total) by mouth daily. For heart and blood pressure    Dispense:  90 tablet    Refill:  1   pregabalin (LYRICA) 75 MG capsule    Sig: Take 1 capsule (75 mg total) by mouth at bedtime.    Dispense:  90 capsule    Refill:  1   amoxicillin-clavulanate (AUGMENTIN) 875-125 MG tablet    Sig: Take 1 tablet by mouth 2 (two) times daily. Take all of this medication    Dispense:  20 tablet    Refill:  0   pseudoephedrine-guaifenesin (MUCINEX D) 60-600 MG 12 hr tablet    Sig: Take 1 tablet by mouth every 12 (twelve) hours for 14 days. As needed for congestion    Dispense:  20 tablet    Refill:  0      Follow-up: Return in about 6 weeks (around 08/08/2022).  [name], M.D.

## 2022-06-28 ENCOUNTER — Other Ambulatory Visit: Payer: Medicare Other

## 2022-06-28 DIAGNOSIS — E782 Mixed hyperlipidemia: Secondary | ICD-10-CM | POA: Diagnosis not present

## 2022-06-28 DIAGNOSIS — I10 Essential (primary) hypertension: Secondary | ICD-10-CM | POA: Diagnosis not present

## 2022-06-28 LAB — LIPID PANEL

## 2022-06-29 LAB — CBC WITH DIFFERENTIAL/PLATELET
Basophils Absolute: 0.1 10*3/uL (ref 0.0–0.2)
Basos: 1 %
EOS (ABSOLUTE): 0.2 10*3/uL (ref 0.0–0.4)
Eos: 3 %
Hematocrit: 42.1 % (ref 37.5–51.0)
Hemoglobin: 14 g/dL (ref 13.0–17.7)
Immature Grans (Abs): 0 10*3/uL (ref 0.0–0.1)
Immature Granulocytes: 0 %
Lymphocytes Absolute: 1.4 10*3/uL (ref 0.7–3.1)
Lymphs: 22 %
MCH: 32.3 pg (ref 26.6–33.0)
MCHC: 33.3 g/dL (ref 31.5–35.7)
MCV: 97 fL (ref 79–97)
Monocytes Absolute: 0.5 10*3/uL (ref 0.1–0.9)
Monocytes: 8 %
Neutrophils Absolute: 4.4 10*3/uL (ref 1.4–7.0)
Neutrophils: 66 %
Platelets: 237 10*3/uL (ref 150–450)
RBC: 4.34 x10E6/uL (ref 4.14–5.80)
RDW: 13.7 % (ref 11.6–15.4)
WBC: 6.5 10*3/uL (ref 3.4–10.8)

## 2022-06-29 LAB — CMP14+EGFR
ALT: 15 IU/L (ref 0–44)
AST: 23 IU/L (ref 0–40)
Albumin/Globulin Ratio: 2.2 (ref 1.2–2.2)
Albumin: 4.7 g/dL (ref 3.8–4.8)
Alkaline Phosphatase: 45 IU/L (ref 44–121)
BUN/Creatinine Ratio: 15 (ref 10–24)
BUN: 18 mg/dL (ref 8–27)
Bilirubin Total: 0.4 mg/dL (ref 0.0–1.2)
CO2: 20 mmol/L (ref 20–29)
Calcium: 9.6 mg/dL (ref 8.6–10.2)
Chloride: 103 mmol/L (ref 96–106)
Creatinine, Ser: 1.21 mg/dL (ref 0.76–1.27)
Globulin, Total: 2.1 g/dL (ref 1.5–4.5)
Glucose: 130 mg/dL — ABNORMAL HIGH (ref 70–99)
Potassium: 4.3 mmol/L (ref 3.5–5.2)
Sodium: 138 mmol/L (ref 134–144)
Total Protein: 6.8 g/dL (ref 6.0–8.5)
eGFR: 61 mL/min/{1.73_m2} (ref >59)

## 2022-06-29 LAB — LIPID PANEL
Chol/HDL Ratio: 5.1 ratio — ABNORMAL HIGH (ref 0.0–5.0)
Cholesterol, Total: 214 mg/dL — ABNORMAL HIGH (ref 100–199)
HDL: 42 mg/dL (ref >39)
LDL Chol Calc (NIH): 123 mg/dL — ABNORMAL HIGH (ref 0–99)
Triglycerides: 277 mg/dL — ABNORMAL HIGH (ref 0–149)
VLDL Cholesterol Cal: 49 mg/dL — ABNORMAL HIGH (ref 5–40)

## 2022-08-16 ENCOUNTER — Encounter: Payer: Self-pay | Admitting: Family Medicine

## 2022-08-16 ENCOUNTER — Ambulatory Visit (INDEPENDENT_AMBULATORY_CARE_PROVIDER_SITE_OTHER): Payer: Medicare Other | Admitting: Family Medicine

## 2022-08-16 VITALS — BP 132/66 | HR 76 | Temp 98.0°F | Ht 70.0 in | Wt 220.2 lb

## 2022-08-16 DIAGNOSIS — M5441 Lumbago with sciatica, right side: Secondary | ICD-10-CM | POA: Diagnosis not present

## 2022-08-16 DIAGNOSIS — Z23 Encounter for immunization: Secondary | ICD-10-CM

## 2022-08-16 DIAGNOSIS — G8929 Other chronic pain: Secondary | ICD-10-CM

## 2022-08-16 DIAGNOSIS — G5731 Lesion of lateral popliteal nerve, right lower limb: Secondary | ICD-10-CM

## 2022-08-16 DIAGNOSIS — G5732 Lesion of lateral popliteal nerve, left lower limb: Secondary | ICD-10-CM | POA: Diagnosis not present

## 2022-08-16 MED ORDER — PREGABALIN 150 MG PO CAPS: 150.0000 mg | ORAL_CAPSULE | Freq: Every day | ORAL | 1 refills | Status: DC

## 2022-08-16 NOTE — Progress Notes (Signed)
Subjective:  Patient ID: Jesse Barton., male    DOB: June 10, 1940  Age: 82 y.o. MRN: [id number]  CC: Medical Management of Chronic Issues   HPI Jesse Barton. presents for follow up of use of Lyrica for back pain. Also has neuropathic pain in each leg. Started on 75 mg. Pain nowAbout the same, but med well tolerated.     08/16/2022   11:41 AM 06/27/2022   11:23 AM 06/15/2021    8:59 AM  Depression screen PHQ 2/9  Decreased Interest 0 0 0  Down, Depressed, Hopeless 0 0   PHQ - 2 Score 0 0 0    History Jesse Barton has a past medical history of Arthritis, Cancer ([facility]), History of kidney stones, History of nephrolithiasis (10/21/2018), Hypertension, Kidney stone, and Sciatic nerve pain.   He has a past surgical history that includes Kidney surgery; Eye surgery (Bilateral); Nephrectomy (Left); Cataract extraction; Cystoscopy/ureteroscopy/holmium laser/stent placement (Right, 11/19/2018); Cystoscopy/ureteroscopy/holmium laser/stent placement (Right, 12/17/2018); and Superficial peroneal nerve release (Right, 05/21/2019).   His family history includes Cancer in his mother; Cancer (age of onset: 52) in his sister; Leukemia in his father; Schizophrenia in his sister.He reports that he has been smoking cigars. He has never used smokeless tobacco. He reports current alcohol use. He reports current drug use. Drug: Marijuana.    ROS Review of Systems  Constitutional:  Negative for fever.  Respiratory:  Negative for shortness of breath.   Cardiovascular:  Negative for chest pain.  Musculoskeletal:  Positive for arthralgias and back pain.  Skin:  Negative for rash.    Objective:  BP 132/66   Pulse 76   Temp 98 F (36.7 C)   Ht 5\' 10"  (1.778 m)   Wt 220 lb 3.2 oz (99.9 kg)   SpO2 95%   BMI 31.60 kg/m   BP Readings from Last 3 Encounters:  08/16/22 132/66  06/27/22 (!) 143/80  12/19/21 (!) 147/79    Wt Readings from Last 3 Encounters:  08/16/22 220 lb 3.2 oz (99.9 kg)  06/27/22 223 lb  3.2 oz (101.2 kg)  12/19/21 221 lb 12.8 oz (100.6 kg)     Physical Exam Constitutional:      Appearance: He is obese.  Cardiovascular:     Rate and Rhythm: Normal rate and regular rhythm.  Pulmonary:     Effort: Pulmonary effort is normal.     Breath sounds: Normal breath sounds.  Neurological:     Mental Status: He is oriented to person, place, and time.  Psychiatric:        Mood and Affect: Mood normal.       Assessment & Plan:   Darshan was seen today for medical management of chronic issues.  Diagnoses and all orders for this visit:  Chronic midline low back pain with right-sided sciatica  Need for Tdap vaccination -     Tdap vaccine greater than or equal to 7yo IM  Neuropathy of left peroneal nerve  Neuropathy of right peroneal nerve  Other orders -     pregabalin (LYRICA) 150 MG capsule; Take 1 capsule (150 mg total) by mouth at bedtime.       I have changed Jesse Cooter C. Barefield Jr.'s pregabalin. I am also having him maintain his aspirin EC, ELDERBERRY PO, simethicone, Liniments (BLUE-EMU SUPER STRENGTH EX), acetaminophen, Calcium-Magnesium-Zinc (CAL-MAG-ZINC PO), CANNABIDIOL PO, fluticasone, olmesartan, amLODipine, fenofibrate, metoprolol succinate, and amoxicillin-clavulanate.  Allergies as of 08/16/2022       Reactions   Hydrochlorothiazide  Constant urination   Ciprofloxacin Other (See Comments)   Increased lower extremity weakness   Lipitor [atorvastatin Calcium]    Generic lipitor caused joint pain   Tamiflu [oseltamivir]    Extreme weakness and joint instability        Medication List        Accurate as of Aug 16, 2022  9:29 PM. If you have any questions, ask your nurse or doctor.          acetaminophen 650 MG CR tablet Commonly known as: TYLENOL Take 1,300 mg by mouth in the morning, at noon, and at bedtime.   amLODipine 10 MG tablet Commonly known as: NORVASC TAKE ONE TABLET BY MOUTH EVERY EVENING.   amoxicillin-clavulanate  875-125 MG tablet Commonly known as: AUGMENTIN Take 1 tablet by mouth 2 (two) times daily. Take all of this medication   aspirin EC 81 MG tablet Commonly known as: Aspirin Adult Low Dose Take 1 tablet (81 mg total) by mouth daily. Swallow whole.   BLUE-EMU SUPER STRENGTH EX Apply 1 application topically 4 (four) times daily as needed (pain.).   CAL-MAG-ZINC PO Take 1 tablet by mouth in the morning and at bedtime.   CANNABIDIOL PO Take 10 drops by mouth in the morning and at bedtime. CBD OIL   ELDERBERRY PO Take 15 mLs by mouth daily. In the morning   fenofibrate 160 MG tablet Take 1 tablet (160 mg total) by mouth daily. For cholesterol and triglyceride   fluticasone 50 MCG/ACT nasal spray Commonly known as: FLONASE Place 1-2 sprays into both nostrils daily as needed for allergies or rhinitis (sinus/congestion.).   metoprolol succinate 50 MG 24 hr tablet Commonly known as: TOPROL-XL Take 1 tablet (50 mg total) by mouth daily. For heart and blood pressure   olmesartan 40 MG tablet Commonly known as: Benicar Take 1 tablet (40 mg total) by mouth daily. For blood pressure   pregabalin 150 MG capsule Commonly known as: Lyrica Take 1 capsule (150 mg total) by mouth at bedtime. What changed:  medication strength how much to take Changed by: [name], MD   simethicone 125 MG chewable tablet Commonly known as: MYLICON Chew 125 mg by mouth every 6 (six) hours as needed for flatulence.         Follow-up: Return in about 6 weeks (around 09/27/2022).  [name], M.D.

## 2022-08-31 ENCOUNTER — Telehealth: Payer: Self-pay | Admitting: Family Medicine

## 2022-08-31 NOTE — Telephone Encounter (Signed)
Pt. Needs to be seen for this. Thanks, WS 

## 2022-09-01 ENCOUNTER — Ambulatory Visit (INDEPENDENT_AMBULATORY_CARE_PROVIDER_SITE_OTHER): Payer: Medicare Other

## 2022-09-01 VITALS — Ht 70.0 in | Wt 220.0 lb

## 2022-09-01 DIAGNOSIS — Z0000 Encounter for general adult medical examination without abnormal findings: Secondary | ICD-10-CM | POA: Diagnosis not present

## 2022-09-01 NOTE — Telephone Encounter (Signed)
Scheduled sooner appointment.

## 2022-09-01 NOTE — Progress Notes (Signed)
Subjective:   Jesse Barton. is a 82 y.o. male who presents for Medicare Annual/Subsequent preventive examination. I connected with  Andee Poles. on 09/01/22 by a audio enabled telemedicine application and verified that I am speaking with the correct person using two identifiers.  Patient Location: Home  Provider Location: Home Office  I discussed the limitations of evaluation and management by telemedicine. The patient expressed understanding and agreed to proceed.  Review of Systems     Cardiac Risk Factors include: advanced age (>68men, >40 women);male gender;dyslipidemia;hypertension     Objective:    Today's Vitals   09/01/22 1322  Weight: 220 lb (99.8 kg)  Height: 5\' 10"  (1.778 m)   Body mass index is 31.57 kg/m.     09/01/2022    1:25 PM 05/21/2019   11:45 AM 05/12/2019    1:12 PM 12/17/2018    9:25 AM 11/15/2018   10:37 AM 11/06/2018    1:30 PM 04/18/2018    1:27 PM  Advanced Directives  Does Patient Have a Medical Advance Directive? No No Yes Yes Yes Yes Yes  Type of Furniture conservator/restorer;Living will Living will;Healthcare Power of Attorney Living will Living will Living will   Does patient want to make changes to medical advance directive?    No - Patient declined No - Patient declined No - Patient declined   Copy of Healthcare Power of Attorney in Chart?  No - copy requested       Would patient like information on creating a medical advance directive? No - Patient declined No - Patient declined    No - Patient declined     Current Medications (verified) Outpatient Encounter Medications as of 09/01/2022  Medication Sig   acetaminophen (TYLENOL) 650 MG CR tablet Take 1,300 mg by mouth in the morning, at noon, and at bedtime.   amLODipine (NORVASC) 10 MG tablet TAKE ONE TABLET BY MOUTH EVERY EVENING.   aspirin (ASPIRIN ADULT LOW DOSE) 81 MG EC tablet Take 1 tablet (81 mg total) by mouth daily. Swallow whole.   Calcium-Magnesium-Zinc  (CAL-MAG-ZINC PO) Take 1 tablet by mouth in the morning and at bedtime.   CANNABIDIOL PO Take 10 drops by mouth in the morning and at bedtime. CBD OIL   ELDERBERRY PO Take 15 mLs by mouth daily. In the morning   fenofibrate 160 MG tablet Take 1 tablet (160 mg total) by mouth daily. For cholesterol and triglyceride   fluticasone (FLONASE) 50 MCG/ACT nasal spray Place 1-2 sprays into both nostrils daily as needed for allergies or rhinitis (sinus/congestion.).   Liniments (BLUE-EMU SUPER STRENGTH EX) Apply 1 application topically 4 (four) times daily as needed (pain.).    metoprolol succinate (TOPROL-XL) 50 MG 24 hr tablet Take 1 tablet (50 mg total) by mouth daily. For heart and blood pressure   olmesartan (BENICAR) 40 MG tablet Take 1 tablet (40 mg total) by mouth daily. For blood pressure   pregabalin (LYRICA) 150 MG capsule Take 1 capsule (150 mg total) by mouth at bedtime.   simethicone (MYLICON) 125 MG chewable tablet Chew 125 mg by mouth every 6 (six) hours as needed for flatulence.   amoxicillin-clavulanate (AUGMENTIN) 875-125 MG tablet Take 1 tablet by mouth 2 (two) times daily. Take all of this medication (Patient not taking: Reported on 09/01/2022)   No facility-administered encounter medications on file as of 09/01/2022.    Allergies (verified) Hydrochlorothiazide, Ciprofloxacin, Lipitor [atorvastatin calcium], and Tamiflu [oseltamivir]  History: Past Medical History:  Diagnosis Date   Arthritis    Cancer (HCC)    Kidney   History of kidney stones    History of nephrolithiasis 10/21/2018   Hypertension    Kidney stone    Sciatic nerve pain    Past Surgical History:  Procedure Laterality Date   CATARACT EXTRACTION     CYSTOSCOPY/URETEROSCOPY/HOLMIUM LASER/STENT PLACEMENT Right 11/19/2018   Procedure: CYSTOSCOPY/RETROGRADE/URETEROSCOPY/HOLMIUM LASER/STENT PLACEMENT;  Surgeon: Jerilee Field, MD;  Location: [facility];  Service: Urology;  Laterality: Right;    CYSTOSCOPY/URETEROSCOPY/HOLMIUM LASER/STENT PLACEMENT Right 12/17/2018   Procedure: CYSTOSCOP RIGHT  /URETEROSCOPY/HOLMIUM LASER/STENT EXCHANGE;  Surgeon: Jerilee Field, MD;  Location: [facility];  Service: Urology;  Laterality: Right;   EYE SURGERY Bilateral    cataracts   KIDNEY SURGERY     NEPHRECTOMY Left    SUPERFICIAL PERONEAL NERVE RELEASE Right 05/21/2019   Procedure: Right peroneal nerve decompression;  Surgeon: Ollen Gross, MD;  Location: [facility];  Service: Orthopedics;  Laterality: Right;    Family History  Problem Relation Age of Onset   Cancer Mother    Leukemia Father    Schizophrenia Sister    Cancer Sister 28       kidney   Social History   Socioeconomic History   Marital status: Married    Spouse name: Not on file   Number of children: 2   Years of education: 13   Highest education level: Some college, no degree  Occupational History   Not on file  Tobacco Use   Smoking status: Light Smoker    Types: Cigars   Smokeless tobacco: Never   Tobacco comments:    smokes  for the last years   Vaping Use   Vaping Use: Never used  Substance and Sexual Activity   Alcohol use: Yes    Comment: occasional   Drug use: Yes    Types: Marijuana    Comment: cbd oil   Sexual activity: Not on file  Other Topics Concern   Not on file  Social History Narrative   Not on file   Social Determinants of Health   Financial Resource Strain: Low Risk  (09/01/2022)   Overall Financial Resource Strain (CARDIA)    Difficulty of Paying Living Expenses: Not hard at all  Food Insecurity: No Food Insecurity (09/01/2022)   Hunger Vital Sign    Worried About Running Out of Food in the Last Year: Never true    Ran Out of Food in the Last Year: Never true  Transportation Needs: No Transportation Needs (09/01/2022)   PRAPARE - Administrator, Civil Service (Medical): No    Lack of Transportation (Non-Medical): No  Physical Activity: Insufficiently  Active (09/01/2022)   Exercise Vital Sign    Days of Exercise per Week: 3 days    Minutes of Exercise per Session: 30 min  Stress: No Stress Concern Present (09/01/2022)   [employer] of Occupational Health - Occupational Stress Questionnaire    Feeling of Stress : Not at all  Social Connections: Socially Integrated (09/01/2022)   Social Connection and Isolation Panel [NHANES]    Frequency of Communication with Friends and Family: More than three times a week    Frequency of Social Gatherings with Friends and Family: More than three times a week    Attends Religious Services: More than 4 times per year    Active Member of [employer] or Organizations: Yes    Attends Banker Meetings:  More than 4 times per year    Marital Status: Married    Tobacco Counseling Ready to quit: No Counseling given: Not Answered Tobacco comments: smokes  for the last years    Clinical Intake:  Pre-visit preparation completed: Yes  Pain : No/denies pain     Nutritional Risks: None Diabetes: No  How often do you need to have someone help you when you read instructions, pamphlets, or other written materials from your doctor or pharmacy?: 1 - Never  Diabetic?no   Interpreter Needed?: No  Information entered by :: Renie Ora, LPN   Activities of Daily Living    09/01/2022    1:25 PM  In your present state of health, do you have any difficulty performing the following activities:  Hearing? 0  Vision? 0  Difficulty concentrating or making decisions? 0  Walking or climbing stairs? 0  Dressing or bathing? 0  Doing errands, shopping? 0  Preparing Food and eating ? N  Using the Toilet? N  In the past six months, have you accidently leaked urine? N  Do you have problems with loss of bowel control? N  Managing your Medications? N  Managing your Finances? N  Housekeeping or managing your Housekeeping? N    Patient Care Team: [name], MD as PCP - General (Family  Medicine)  Indicate any recent Medical Services you may have received from other than [facility] providers in the past year (date may be approximate).     Assessment:   This is a routine wellness examination for [place].  Hearing/Vision screen Vision Screening - Comments:: Wears rx glasses - up to date with routine eye exams with  Dr.Turner  Dietary issues and exercise activities discussed: Current Exercise Habits: Home exercise routine, Type of exercise: walking, Time (Minutes): 30, Frequency (Times/Week): 3, Weekly Exercise (Minutes/Week): 90, Intensity: Mild, Exercise limited by: orthopedic condition(s)   Goals Addressed             This Visit's Progress    DIET - INCREASE WATER INTAKE         Depression Screen    09/01/2022    1:24 PM 08/16/2022   11:41 AM 06/27/2022   11:23 AM 06/15/2021    8:59 AM 12/16/2020    8:53 AM 09/27/2020    9:53 AM 09/27/2020    9:44 AM  PHQ 2/9 Scores  PHQ - 2 Score 0 0 0 0 0 0 0  PHQ- 9 Score      4     Fall Risk    09/01/2022    1:23 PM 08/16/2022   11:41 AM 06/27/2022   11:23 AM 06/15/2021    8:59 AM 12/16/2020    8:53 AM  Fall Risk   Falls in the past year? 0 0 0 0 0  Number falls in past yr: 0      Injury with Fall? 0      Risk for fall due to : No Fall Risks      Follow up Falls prevention discussed        FALL RISK PREVENTION PERTAINING TO THE HOME:  Any stairs in or around the home? No  If so, are there any without handrails? No  Home free of loose throw rugs in walkways, pet beds, electrical cords, etc? Yes  Adequate lighting in your home to reduce risk of falls? Yes   ASSISTIVE DEVICES UTILIZED TO PREVENT FALLS:  Life alert? No  Use of a cane, walker or w/c? Yes  Grab bars in the bathroom? Yes  Shower chair or bench in shower? No  Elevated toilet seat or a handicapped toilet? No       05/31/2017   12:01 PM  MMSE - Mini Mental State Exam  Orientation to time 5  Orientation to Place 5  Registration 3  Attention/  Calculation 5  Recall 1  Language- name 2 objects 2  Language- repeat 1  Language- follow 3 step command 3  Language- read & follow direction 1  Write a sentence 1  Copy design 1  Total score 28        09/01/2022    1:26 PM  6CIT Screen  What Year? 0 points  What month? 0 points  What time? 0 points  Count back from 20 0 points  Months in reverse 0 points  Repeat phrase 0 points  Total Score 0 points    Immunizations Immunization History  Administered Date(s) Administered   Fluad Quad(high Dose 65+) 03/01/2020, 12/16/2020, 12/19/2021   Influenza,inj,Quad PF,6+ Mos 03/11/2015   Moderna Sars-Covid-2 Vaccination 06/19/2019, 07/17/2019   Pneumococcal Conjugate-13 05/16/2019   Pneumococcal Polysaccharide-23 08/30/2020   Tdap 08/16/2022    TDAP status: Up to date  Flu Vaccine status: Up to date  Pneumococcal vaccine status: Up to date  Covid-19 vaccine status: Declined, Education has been provided regarding the importance of this vaccine but patient still declined. Advised may receive this vaccine at local pharmacy or Health Dept.or vaccine clinic. Aware to provide a copy of the vaccination record if obtained from local pharmacy or Health Dept. Verbalized acceptance and understanding.  Qualifies for Shingles Vaccine? Yes   Zostavax completed No   Shingrix Completed?: No.    Education has been provided regarding the importance of this vaccine. Patient has been advised to call insurance company to determine out of pocket expense if they have not yet received this vaccine. Advised may also receive vaccine at local pharmacy or Health Dept. Verbalized acceptance and understanding.  Screening Tests Health Maintenance  Topic Date Due   COVID-19 Vaccine (3 - 2023-24 season) 09/01/2022 (Originally 11/18/2021)   Zoster Vaccines- Shingrix (1 of 2) 11/16/2022 (Originally [birth year]/19/1993)   Hepatitis C Screening  06/27/2023 (Originally 01/19/1959)   Medicare Annual Wellness (AWV)  09/01/2023    DTaP/Tdap/Td (2 - Td or Tdap) 08/15/2032   Pneumonia Vaccine 68+ Years old  Completed   HPV VACCINES  Aged Out   INFLUENZA VACCINE  Discontinued    Health Maintenance  There are no preventive care reminders to display for this patient.   Colorectal cancer screening: No longer required.   Lung Cancer Screening: (Low Dose CT Chest recommended if Age 59-80 years, 30 pack-year currently smoking OR have quit w/in 15years.) does qualify.   Lung Cancer Screening Referral: declined   Additional Screening:  Hepatitis C Screening: does qualify;  Vision Screening: Recommended annual ophthalmology exams for early detection of glaucoma and other disorders of the eye. Is the patient up to date with their annual eye exam?  No  Who is the provider or what is the name of the office in which the patient attends annual eye exams? Dr.Turner patient to call schedule  If pt is not established with a provider, would they like to be referred to a provider to establish care? No .   Dental Screening: Recommended annual dental exams for proper oral hygiene  Community Resource Referral / Chronic Care Management: CRR required this visit?  No   CCM required this visit?  No      Plan:     I have personally reviewed and noted the following in the patient's chart:   Medical and social history Use of alcohol, tobacco or illicit drugs  Current medications and supplements including opioid prescriptions. Patient is not currently taking opioid prescriptions. Functional ability and status Nutritional status Physical activity Advanced directives List of other physicians Hospitalizations, surgeries, and ER visits in previous 12 months Vitals Screenings to include cognitive, depression, and falls Referrals and appointments  In addition, I have reviewed and discussed with patient certain preventive protocols, quality metrics, and best practice recommendations. A written personalized care plan for  preventive services as well as general preventive health recommendations were provided to patient.     Lorrene Reid, LPN   1/61/0960   Nurse Notes: none

## 2022-09-01 NOTE — Telephone Encounter (Signed)
Pt r/c. I offered 6/26//2024. Pt asking to be seen sooner. Please call back

## 2022-09-01 NOTE — Telephone Encounter (Signed)
Spoke to pt wife, is aware that pt needs an appt with Dr. Darlyn Read, wife stated she could not make appt but pt would call back to schedule.

## 2022-09-01 NOTE — Patient Instructions (Signed)
[name] , Thank you for taking time to come for your Medicare Wellness Visit. I appreciate your ongoing commitment to your health goals. Please review the following plan we discussed and let me know if I can assist you in the future.   These are the goals we discussed:  Goals      DIET - INCREASE WATER INTAKE     Exercise 150 min/wk Moderate Activity        This is a list of the screening recommended for you and due dates:  Health Maintenance  Topic Date Due   COVID-19 Vaccine (3 - 2023-24 season) 09/01/2022*   Zoster (Shingles) Vaccine (1 of 2) 11/16/2022*   Hepatitis C Screening  06/27/2023*   Medicare Annual Wellness Visit  09/01/2023   DTaP/Tdap/Td vaccine (2 - Td or Tdap) 08/15/2032   Pneumonia Vaccine  Completed   HPV Vaccine  Aged Out   Flu Shot  Discontinued  *Topic was postponed. The date shown is not the original due date.    Advanced directives: Advance directive discussed with you today. I have provided a copy for you to complete at home and have notarized. Once this is complete please bring a copy in to our office so we can scan it into your chart.   Conditions/risks identified: Aim for 30 minutes of exercise or brisk walking, 6-8 glasses of water, and 5 servings of fruits and vegetables each day.   Next appointment: Follow up in one year for your annual wellness visit.   Preventive Care 41 Years and Older, Male  Preventive care refers to lifestyle choices and visits with your health care provider that can promote health and wellness. What does preventive care include? A yearly physical exam. This is also called an annual well check. Dental exams once or twice a year. Routine eye exams. Ask your health care provider how often you should have your eyes checked. Personal lifestyle choices, including: Daily care of your teeth and gums. Regular physical activity. Eating a healthy diet. Avoiding tobacco and drug use. Limiting alcohol use. Practicing safe  sex. Taking low doses of aspirin every day. Taking vitamin and mineral supplements as recommended by your health care provider. What happens during an annual well check? The services and screenings done by your health care provider during your annual well check will depend on your age, overall health, lifestyle risk factors, and family history of disease. Counseling  Your health care provider may ask you questions about your: Alcohol use. Tobacco use. Drug use. Emotional well-being. Home and relationship well-being. Sexual activity. Eating habits. History of falls. Memory and ability to understand (cognition). Work and work Astronomer. Screening  You may have the following tests or measurements: Height, weight, and BMI. Blood pressure. Lipid and cholesterol levels. These may be checked every 5 years, or more frequently if you are over 8 years old. Skin check. Lung cancer screening. You may have this screening every year starting at age 70 if you have a 30-pack-year history of smoking and currently smoke or have quit within the past 15 years. Fecal occult blood test (FOBT) of the stool. You may have this test every year starting at age 80. Flexible sigmoidoscopy or colonoscopy. You may have a sigmoidoscopy every 5 years or a colonoscopy every 10 years starting at age 14. Prostate cancer screening. Recommendations will vary depending on your family history and other risks. Hepatitis C blood test. Hepatitis B blood test. Sexually transmitted disease (STD) testing. Diabetes screening. This is done  by checking your blood sugar (glucose) after you have not eaten for a while (fasting). You may have this done every 1-3 years. Abdominal aortic aneurysm (AAA) screening. You may need this if you are a current or former smoker. Osteoporosis. You may be screened starting at age 39 if you are at high risk. Talk with your health care provider about your test results, treatment options, and if  necessary, the need for more tests. Vaccines  Your health care provider may recommend certain vaccines, such as: Influenza vaccine. This is recommended every year. Tetanus, diphtheria, and acellular pertussis (Tdap, Td) vaccine. You may need a Td booster every 10 years. Zoster vaccine. You may need this after age 64. Pneumococcal 13-valent conjugate (PCV13) vaccine. One dose is recommended after age 70. Pneumococcal polysaccharide (PPSV23) vaccine. One dose is recommended after age 59. Talk to your health care provider about which screenings and vaccines you need and how often you need them. This information is not intended to replace advice given to you by your health care provider. Make sure you discuss any questions you have with your health care provider. Document Released: 04/02/2015 Document Revised: 11/24/2015 Document Reviewed: 01/05/2015 Elsevier Interactive Patient Education  2017 [employer].  Fall Prevention in the Home Falls can cause injuries. They can happen to people of all ages. There are many things you can do to make your home safe and to help prevent falls. What can I do on the outside of my home? Regularly fix the edges of walkways and driveways and fix any cracks. Remove anything that might make you trip as you walk through a door, such as a raised step or threshold. Trim any bushes or trees on the path to your home. Use bright outdoor lighting. Clear any walking paths of anything that might make someone trip, such as rocks or tools. Regularly check to see if handrails are loose or broken. Make sure that both sides of any steps have handrails. Any raised decks and porches should have guardrails on the edges. Have any leaves, snow, or ice cleared regularly. Use sand or salt on walking paths during winter. Clean up any spills in your garage right away. This includes oil or grease spills. What can I do in the bathroom? Use night lights. Install grab bars by the toilet  and in the tub and shower. Do not use towel bars as grab bars. Use non-skid mats or decals in the tub or shower. If you need to sit down in the shower, use a plastic, non-slip stool. Keep the floor dry. Clean up any water that spills on the floor as soon as it happens. Remove soap buildup in the tub or shower regularly. Attach bath mats securely with double-sided non-slip rug tape. Do not have throw rugs and other things on the floor that can make you trip. What can I do in the bedroom? Use night lights. Make sure that you have a light by your bed that is easy to reach. Do not use any sheets or blankets that are too big for your bed. They should not hang down onto the floor. Have a firm chair that has side arms. You can use this for support while you get dressed. Do not have throw rugs and other things on the floor that can make you trip. What can I do in the kitchen? Clean up any spills right away. Avoid walking on wet floors. Keep items that you use a lot in easy-to-reach places. If you need to  reach something above you, use a strong step stool that has a grab bar. Keep electrical cords out of the way. Do not use floor polish or wax that makes floors slippery. If you must use wax, use non-skid floor wax. Do not have throw rugs and other things on the floor that can make you trip. What can I do with my stairs? Do not leave any items on the stairs. Make sure that there are handrails on both sides of the stairs and use them. Fix handrails that are broken or loose. Make sure that handrails are as long as the stairways. Check any carpeting to make sure that it is firmly attached to the stairs. Fix any carpet that is loose or worn. Avoid having throw rugs at the top or bottom of the stairs. If you do have throw rugs, attach them to the floor with carpet tape. Make sure that you have a light switch at the top of the stairs and the bottom of the stairs. If you do not have them, ask someone to add  them for you. What else can I do to help prevent falls? Wear shoes that: Do not have high heels. Have rubber bottoms. Are comfortable and fit you well. Are closed at the toe. Do not wear sandals. If you use a stepladder: Make sure that it is fully opened. Do not climb a closed stepladder. Make sure that both sides of the stepladder are locked into place. Ask someone to hold it for you, if possible. Clearly mark and make sure that you can see: Any grab bars or handrails. First and last steps. Where the edge of each step is. Use tools that help you move around (mobility aids) if they are needed. These include: Canes. Walkers. Scooters. Crutches. Turn on the lights when you go into a dark area. Replace any light bulbs as soon as they burn out. Set up your furniture so you have a clear path. Avoid moving your furniture around. If any of your floors are uneven, fix them. If there are any pets around you, be aware of where they are. Review your medicines with your doctor. Some medicines can make you feel dizzy. This can increase your chance of falling. Ask your doctor what other things that you can do to help prevent falls. This information is not intended to replace advice given to you by your health care provider. Make sure you discuss any questions you have with your health care provider. Document Released: 12/31/2008 Document Revised: 08/12/2015 Document Reviewed: 04/10/2014 Elsevier Interactive Patient Education  2017 [employer].

## 2022-09-04 ENCOUNTER — Ambulatory Visit: Payer: Medicare Other | Admitting: Family Medicine

## 2022-09-13 ENCOUNTER — Encounter: Payer: Self-pay | Admitting: Family Medicine

## 2022-09-13 ENCOUNTER — Ambulatory Visit (INDEPENDENT_AMBULATORY_CARE_PROVIDER_SITE_OTHER): Payer: Medicare Other | Admitting: Family Medicine

## 2022-09-13 VITALS — BP 149/75 | HR 86 | Temp 97.7°F | Ht 70.0 in | Wt 222.0 lb

## 2022-09-13 DIAGNOSIS — G8929 Other chronic pain: Secondary | ICD-10-CM

## 2022-09-13 DIAGNOSIS — M25561 Pain in right knee: Secondary | ICD-10-CM

## 2022-09-13 DIAGNOSIS — M25562 Pain in left knee: Secondary | ICD-10-CM

## 2022-09-13 MED ORDER — ACETAMINOPHEN 500 MG PO TABS: 1000.0000 mg | ORAL_TABLET | Freq: Three times a day (TID) | ORAL | 99 refills | Status: AC

## 2022-09-13 MED ORDER — GABAPENTIN 300 MG PO CAPS: ORAL_CAPSULE | ORAL | 2 refills | Status: DC

## 2022-09-13 NOTE — Progress Notes (Signed)
Subjective:  Patient ID: Jesse Poles., male    DOB: Jul 06, 1940  Age: 82 y.o. MRN: [id number]  CC: No chief complaint on file.   HPI Jesse Barton. presents for increase in knee pain with pregabalin. Pain increased tremendously within 2 days of starting the medication at the higher 150 mg dose. It went away when he DCed the medication, again within a day or two. He did not get relief or have side effects with the lower dose. Pain now at baseline. HE has a lot of arthritis in the knees, he has only one kidney sp he cannot take NSAIDS.      09/13/2022    1:22 PM 09/01/2022    1:24 PM 08/16/2022   11:41 AM  Depression screen PHQ 2/9  Decreased Interest 0 0 0  Down, Depressed, Hopeless 0 0 0  PHQ - 2 Score 0 0 0    History Constant has a past medical history of Arthritis, Cancer (HCC), History of kidney stones, History of nephrolithiasis (10/21/2018), Hypertension, Kidney stone, and Sciatic nerve pain.   He has a past surgical history that includes Kidney surgery; Eye surgery (Bilateral); Nephrectomy (Left); Cataract extraction; Cystoscopy/ureteroscopy/holmium laser/stent placement (Right, 11/19/2018); Cystoscopy/ureteroscopy/holmium laser/stent placement (Right, 12/17/2018); and Superficial peroneal nerve release (Right, 05/21/2019).   His family history includes Cancer in his mother; Cancer (age of onset: 79) in his sister; Leukemia in his father; Schizophrenia in his sister.He reports that he has been smoking cigars. He has never used smokeless tobacco. He reports current alcohol use. He reports current drug use. Drug: Marijuana.    ROS Review of Systems  Constitutional:  Negative for fever.  Respiratory:  Negative for shortness of breath.   Cardiovascular:  Negative for chest pain.  Musculoskeletal:  Positive for arthralgias and gait problem.  Skin:  Negative for rash.    Objective:  BP (!) 149/75   Pulse 86   Temp 97.7 F (36.5 C)   Ht 5\' 10"  (1.778 m)   Wt 222 lb (100.7 kg)    SpO2 98%   BMI 31.85 kg/m   BP Readings from Last 3 Encounters:  09/13/22 (!) 149/75  08/16/22 132/66  06/27/22 (!) 143/80    Wt Readings from Last 3 Encounters:  09/13/22 222 lb (100.7 kg)  09/01/22 220 lb (99.8 kg)  08/16/22 220 lb 3.2 oz (99.9 kg)     Physical Exam Vitals reviewed.  Constitutional:      Appearance: He is well-developed.  HENT:     Head: Normocephalic and atraumatic.     Right Ear: External ear normal.     Left Ear: External ear normal.     Mouth/Throat:     Pharynx: No oropharyngeal exudate or posterior oropharyngeal erythema.  Eyes:     Pupils: Pupils are equal, round, and reactive to light.  Cardiovascular:     Rate and Rhythm: Normal rate and regular rhythm.     Heart sounds: No murmur heard. Pulmonary:     Effort: No respiratory distress.     Breath sounds: Normal breath sounds.  Musculoskeletal:        General: Tenderness (knees) present. No swelling.     Cervical back: Normal range of motion and neck supple.  Neurological:     Mental Status: He is alert and oriented to person, place, and time.       Assessment & Plan:   Diagnoses and all orders for this visit:  Chronic pain of both knees  Other  orders -     gabapentin (NEURONTIN) 300 MG capsule; 1 at bedtime for 2 weeks then one twice a day. -     acetaminophen (TYLENOL) 500 MG tablet; Take 2 tablets (1,000 mg total) by mouth 3 (three) times daily.       I have discontinued [name]'s acetaminophen, amoxicillin-clavulanate, and pregabalin. I am also having him start on gabapentin and acetaminophen. Additionally, I am having him maintain his aspirin EC, ELDERBERRY PO, simethicone, Liniments (BLUE-EMU SUPER STRENGTH EX), Calcium-Magnesium-Zinc (CAL-MAG-ZINC PO), CANNABIDIOL PO, fluticasone, olmesartan, amLODipine, fenofibrate, and metoprolol succinate.  Allergies as of 09/13/2022       Reactions   Hydrochlorothiazide    Constant urination   Ciprofloxacin Other (See  Comments)   Increased lower extremity weakness   Lipitor [atorvastatin Calcium]    Generic lipitor caused joint pain   Tamiflu [oseltamivir]    Extreme weakness and joint instability   Pregabalin Other (See Comments)   JOINT PAIN AND WEAKNESS        Medication List        Accurate as of September 13, 2022  2:01 PM. If you have any questions, ask your nurse or doctor.          STOP taking these medications    acetaminophen 650 MG CR tablet Commonly known as: TYLENOL Replaced by: acetaminophen 500 MG tablet Stopped by: [name], MD   amoxicillin-clavulanate [phone] MG tablet Commonly known as: AUGMENTIN Stopped by: [name], MD   pregabalin 150 MG capsule Commonly known as: Lyrica Stopped by: [name], MD       TAKE these medications    acetaminophen 500 MG tablet Commonly known as: TYLENOL Take 2 tablets (1,000 mg total) by mouth 3 (three) times daily. Replaces: acetaminophen 650 MG CR tablet Started by: [name], MD   amLODipine 10 MG tablet Commonly known as: NORVASC TAKE ONE TABLET BY MOUTH EVERY EVENING.   aspirin EC 81 MG tablet Commonly known as: Aspirin Adult Low Dose Take 1 tablet (81 mg total) by mouth daily. Swallow whole.   BLUE-EMU SUPER STRENGTH EX Apply 1 application topically 4 (four) times daily as needed (pain.).   CAL-MAG-ZINC PO Take 1 tablet by mouth in the morning and at bedtime.   CANNABIDIOL PO Take 10 drops by mouth in the morning and at bedtime. CBD OIL   ELDERBERRY PO Take 15 mLs by mouth daily. In the morning   fenofibrate 160 MG tablet Take 1 tablet (160 mg total) by mouth daily. For cholesterol and triglyceride   fluticasone 50 MCG/ACT nasal spray Commonly known as: FLONASE Place 1-2 sprays into both nostrils daily as needed for allergies or rhinitis (sinus/congestion.).   gabapentin 300 MG capsule Commonly known as: NEURONTIN 1 at bedtime for 2 weeks then one twice a day. Started by: [name], MD   metoprolol succinate 50 MG 24 hr tablet Commonly known as: TOPROL-XL Take 1 tablet (50 mg total) by mouth daily. For heart and blood pressure   olmesartan 40 MG tablet Commonly known as: Benicar Take 1 tablet (40 mg total) by mouth daily. For blood pressure   simethicone 125 MG chewable tablet Commonly known as: MYLICON Chew 125 mg by mouth every 6 (six) hours as needed for flatulence.         Follow-up: Return in about 1 month (around 10/13/2022).  [name], M.D.

## 2022-09-27 ENCOUNTER — Ambulatory Visit: Payer: Medicare Other | Admitting: Family Medicine

## 2022-10-16 ENCOUNTER — Ambulatory Visit (INDEPENDENT_AMBULATORY_CARE_PROVIDER_SITE_OTHER): Payer: Medicare Other | Admitting: Family Medicine

## 2022-10-16 ENCOUNTER — Encounter: Payer: Self-pay | Admitting: Family Medicine

## 2022-10-16 VITALS — BP 138/72 | HR 80 | Temp 98.1°F | Ht 70.0 in | Wt 222.2 lb

## 2022-10-16 DIAGNOSIS — G5731 Lesion of lateral popliteal nerve, right lower limb: Secondary | ICD-10-CM | POA: Diagnosis not present

## 2022-10-16 NOTE — Progress Notes (Signed)
Subjective:  Patient ID: Jesse Poles., male    DOB: [birth year]  Age: 82 y.o. MRN: [id number]  CC: Follow-up and Knee Pain   HPI Jesse Poles. presents for Dced gabapentin after 3 weeks because it gave him cramps in the legs. Has tried multiple therapies and hasn't tolerated any. Pain is now described as cramping.      10/16/2022   10:42 AM 09/13/2022    1:22 PM 09/01/2022    1:24 PM  Depression screen PHQ 2/9  Decreased Interest 0 0 0  Down, Depressed, Hopeless 0 0 0  PHQ - 2 Score 0 0 0    History Jesse Barton has a past medical history of Arthritis, Cancer (HCC), History of kidney stones, History of nephrolithiasis (10/21/2018), Hypertension, Kidney stone, and Sciatic nerve pain.   Jesse Barton has a past surgical history that includes Kidney surgery; Eye surgery (Bilateral); Nephrectomy (Left); Cataract extraction; Cystoscopy/ureteroscopy/holmium laser/stent placement (Right, 11/19/2018); Cystoscopy/ureteroscopy/holmium laser/stent placement (Right, 12/17/2018); and Superficial peroneal nerve release (Right, 05/21/2019).   His family history includes Cancer in his mother; Cancer (age of onset: 79) in his sister; Leukemia in his father; Schizophrenia in his sister.Jesse Barton reports that Jesse Barton has been smoking cigars. Jesse Barton has never used smokeless tobacco. Jesse Barton reports current alcohol use. Jesse Barton reports current drug use. Drug: Marijuana.    ROS Review of Systems  Constitutional:  Negative for fever.  Respiratory:  Negative for shortness of breath.   Cardiovascular:  Negative for chest pain.  Musculoskeletal:  Negative for arthralgias.  Skin:  Negative for rash.    Objective:  BP 138/72   Pulse 80   Temp 98.1 F (36.7 C)   Ht 5\' 10"  (1.778 m)   Wt 222 lb 3.2 oz (100.8 kg)   SpO2 97%   BMI 31.88 kg/m   BP Readings from Last 3 Encounters:  10/16/22 138/72  09/13/22 (!) 149/75  08/16/22 132/66    Wt Readings from Last 3 Encounters:  10/16/22 222 lb 3.2 oz (100.8 kg)  09/13/22 222 lb (100.7 kg)   09/01/22 220 lb (99.8 kg)     Physical Exam Vitals reviewed.  Constitutional:      Appearance: Jesse Barton is well-developed.  HENT:     Head: Normocephalic and atraumatic.     Right Ear: External ear normal.     Left Ear: External ear normal.     Mouth/Throat:     Pharynx: No oropharyngeal exudate or posterior oropharyngeal erythema.  Eyes:     Pupils: Pupils are equal, round, and reactive to light.  Cardiovascular:     Rate and Rhythm: Normal rate and regular rhythm.     Heart sounds: No murmur heard. Pulmonary:     Effort: No respiratory distress.     Breath sounds: Normal breath sounds.  Musculoskeletal:     Cervical back: Normal range of motion and neck supple.  Neurological:     Mental Status: Jesse Barton is alert and oriented to person, place, and time.       Assessment & Plan:   Jesse Barton was seen today for follow-up and knee pain.  Diagnoses and all orders for this visit:  Neuropathy of right peroneal nerve       I am having Jesse Barton. maintain his aspirin EC, ELDERBERRY PO, simethicone, Liniments (BLUE-EMU SUPER STRENGTH EX), Calcium-Magnesium-Zinc (CAL-MAG-ZINC PO), CANNABIDIOL PO, fluticasone, olmesartan, amLODipine, fenofibrate, metoprolol succinate, gabapentin, and acetaminophen.  Allergies as of 10/16/2022       Reactions   Hydrochlorothiazide  Constant urination   Ciprofloxacin Other (See Comments)   Increased lower extremity weakness   Lipitor [atorvastatin Calcium]    Generic lipitor caused joint pain   Tamiflu [oseltamivir]    Extreme weakness and joint instability   Pregabalin Other (See Comments)   JOINT PAIN AND WEAKNESS        Medication List        Accurate as of October 16, 2022  5:03 PM. If you have any questions, ask your nurse or doctor.          acetaminophen 500 MG tablet Commonly known as: TYLENOL Take 2 tablets (1,000 mg total) by mouth 3 (three) times daily.   amLODipine 10 MG tablet Commonly known as: NORVASC TAKE ONE  TABLET BY MOUTH EVERY EVENING.   aspirin EC 81 MG tablet Commonly known as: Aspirin Adult Low Dose Take 1 tablet (81 mg total) by mouth daily. Swallow whole.   BLUE-EMU SUPER STRENGTH EX Apply 1 application topically 4 (four) times daily as needed (pain.).   CAL-MAG-ZINC PO Take 1 tablet by mouth in the morning and at bedtime.   CANNABIDIOL PO Take 10 drops by mouth in the morning and at bedtime. CBD OIL   ELDERBERRY PO Take 15 mLs by mouth daily. In the morning   fenofibrate 160 MG tablet Take 1 tablet (160 mg total) by mouth daily. For cholesterol and triglyceride   fluticasone 50 MCG/ACT nasal spray Commonly known as: FLONASE Place 1-2 sprays into both nostrils daily as needed for allergies or rhinitis (sinus/congestion.).   gabapentin 300 MG capsule Commonly known as: NEURONTIN 1 at bedtime for 2 weeks then one twice a day.   metoprolol succinate 50 MG 24 hr tablet Commonly known as: TOPROL-XL Take 1 tablet (50 mg total) by mouth daily. For heart and blood pressure   olmesartan 40 MG tablet Commonly known as: Benicar Take 1 tablet (40 mg total) by mouth daily. For blood pressure   simethicone 125 MG chewable tablet Commonly known as: MYLICON Chew 125 mg by mouth every 6 (six) hours as needed for flatulence.         Follow-up: No follow-ups on file.  [name], M.D.

## 2022-11-21 ENCOUNTER — Other Ambulatory Visit: Payer: Self-pay | Admitting: Family Medicine

## 2022-11-21 DIAGNOSIS — I10 Essential (primary) hypertension: Secondary | ICD-10-CM

## 2023-01-16 ENCOUNTER — Other Ambulatory Visit: Payer: Self-pay | Admitting: Family Medicine

## 2023-04-10 ENCOUNTER — Other Ambulatory Visit: Payer: Self-pay | Admitting: Family Medicine

## 2023-04-19 ENCOUNTER — Ambulatory Visit: Payer: Medicare Other | Admitting: Family Medicine

## 2023-05-03 ENCOUNTER — Ambulatory Visit: Payer: Medicare Other | Admitting: Family Medicine

## 2023-05-03 VITALS — BP 127/63 | HR 67 | Ht 70.0 in | Wt 215.0 lb

## 2023-05-03 DIAGNOSIS — Z23 Encounter for immunization: Secondary | ICD-10-CM

## 2023-05-03 DIAGNOSIS — I10 Essential (primary) hypertension: Secondary | ICD-10-CM

## 2023-05-03 DIAGNOSIS — J0100 Acute maxillary sinusitis, unspecified: Secondary | ICD-10-CM

## 2023-05-03 DIAGNOSIS — E782 Mixed hyperlipidemia: Secondary | ICD-10-CM

## 2023-05-03 DIAGNOSIS — G5732 Lesion of lateral popliteal nerve, left lower limb: Secondary | ICD-10-CM

## 2023-05-03 DIAGNOSIS — G5731 Lesion of lateral popliteal nerve, right lower limb: Secondary | ICD-10-CM

## 2023-05-03 LAB — LIPID PANEL

## 2023-05-03 MED ORDER — AMOXICILLIN-POT CLAVULANATE 875-125 MG PO TABS: 1.0000 | ORAL_TABLET | Freq: Two times a day (BID) | ORAL | 0 refills | Status: DC

## 2023-05-03 NOTE — Progress Notes (Signed)
 Subjective:  Patient ID: Jesse Poles., male    DOB: Aug 11, 1940  Age: 83 y.o. MRN: [id number]  CC: Medical Management of Chronic Issues and Sinus Problem (3 weeks. Belieevs he might have sinus infection. Green and thick mucous. Sinus pressure ena da lot of drainage. OTC not helping. )   HPI Jesse Poles. presents for Symptoms include congestion, facial pain, nasal congestion, non productive cough, post nasal drip and sinus pressure. There is no fever, chills, or sweats. Onset of symptoms was a few days ago, gradually worsening since that time.    presents for  follow-up of hypertension. Patient has no history of headache chest pain or shortness of breath or recent cough. Patient also denies symptoms of TIA such as focal numbness or weakness. Patient denies side effects from medication. States taking it regularly.  Also has pain from bilateral peroneal neuropathy. Chronic. Stable   in for follow-up of elevated cholesterol. Doing well without complaints on current medication. Denies side effects of statin including myalgia and arthralgia and nausea. Currently no chest pain, shortness of breath or other cardiovascular related symptoms noted.        05/03/2023    1:37 PM 10/16/2022   10:42 AM 09/13/2022    1:22 PM  Depression screen PHQ 2/9  Decreased Interest 0 0 0  Down, Depressed, Hopeless 0 0 0  PHQ - 2 Score 0 0 0  Altered sleeping 0    Tired, decreased energy 0    Change in appetite 0    Feeling bad or failure about yourself  0    Trouble concentrating 0    Moving slowly or fidgety/restless 0    Suicidal thoughts 0    PHQ-9 Score 0    Difficult doing work/chores Not difficult at all      History Jesse Barton has a past medical history of Arthritis, Cancer (HCC), History of kidney stones, History of nephrolithiasis (10/21/2018), Hypertension, Kidney stone, and Sciatic nerve pain.   He has a past surgical history that includes Kidney surgery; Eye surgery (Bilateral);  Nephrectomy (Left); Cataract extraction; Cystoscopy/ureteroscopy/holmium laser/stent placement (Right, 11/19/2018); Cystoscopy/ureteroscopy/holmium laser/stent placement (Right, 12/17/2018); and Superficial peroneal nerve release (Right, 05/21/2019).   His family history includes Cancer in his mother; Cancer (age of onset: 52) in his sister; Leukemia in his father; Schizophrenia in his sister.He reports that he has been smoking cigars. He has never used smokeless tobacco. He reports current alcohol use. He reports current drug use. Drug: Marijuana.    ROS Review of Systems  Constitutional:  Positive for chills and fatigue. Negative for fever.  HENT:  Negative for congestion and sore throat.   Respiratory:  Negative for cough, shortness of breath and wheezing.   Cardiovascular:  Negative for chest pain.  Musculoskeletal:  Positive for myalgias. Negative for arthralgias.  Skin:  Negative for color change and rash.  Neurological:  Negative for dizziness and syncope.    Objective:  BP 127/63   Pulse 67   Ht 5\' 10"  (1.778 m)   Wt 215 lb (97.5 kg)   SpO2 97%   BMI 30.85 kg/m   BP Readings from Last 3 Encounters:  05/03/23 127/63  10/16/22 138/72  09/13/22 (!) 149/75    Wt Readings from Last 3 Encounters:  05/03/23 215 lb (97.5 kg)  10/16/22 222 lb 3.2 oz (100.8 kg)  09/13/22 222 lb (100.7 kg)     Physical Exam Vitals reviewed.  Constitutional:      Appearance: He is  well-developed.  HENT:     Head: Normocephalic and atraumatic.     Right Ear: External ear normal.     Left Ear: External ear normal.     Mouth/Throat:     Pharynx: No oropharyngeal exudate or posterior oropharyngeal erythema.  Eyes:     Pupils: Pupils are equal, round, and reactive to light.  Cardiovascular:     Rate and Rhythm: Normal rate and regular rhythm.     Heart sounds: No murmur heard. Pulmonary:     Effort: No respiratory distress.     Breath sounds: Normal breath sounds.  Musculoskeletal:      Cervical back: Normal range of motion and neck supple.  Neurological:     Mental Status: He is alert and oriented to person, place, and time.       Assessment & Plan:   Janai was seen today for medical management of chronic issues and sinus problem.  Diagnoses and all orders for this visit:  Neuropathy of left peroneal nerve -     CBC with Differential/Platelet -     CMP14+EGFR -     Lipid panel  Neuropathy of right peroneal nerve -     CBC with Differential/Platelet -     CMP14+EGFR -     Lipid panel  Primary hypertension -     CBC with Differential/Platelet -     CMP14+EGFR -     Lipid panel  Encounter for immunization -     Flu Vaccine Trivalent High Dose (Fluad)  Acute maxillary sinusitis, recurrence not specified  Other orders -     amoxicillin-clavulanate (AUGMENTIN) 875-125 MG tablet; Take 1 tablet by mouth 2 (two) times daily. Take all of this medication -     olmesartan (BENICAR) 40 MG tablet; Take 1 tablet (40 mg total) by mouth daily.       I have discontinued Sherilyn Cooter C. Zeiders Jr.'s simethicone and Liniments (BLUE-EMU SUPER STRENGTH EX). I have also changed his olmesartan. Additionally, I am having him start on amoxicillin-clavulanate. Lastly, I am having him maintain his aspirin EC, ELDERBERRY PO, Calcium-Magnesium-Zinc (CAL-MAG-ZINC PO), CANNABIDIOL PO, fluticasone, fenofibrate, gabapentin, acetaminophen, amLODipine, metoprolol succinate, and Elderberry.  Allergies as of 05/03/2023       Reactions   Hydrochlorothiazide    Constant urination   Ciprofloxacin Other (See Comments)   Increased lower extremity weakness   Lipitor [atorvastatin Calcium]    Generic lipitor caused joint pain   Tamiflu [oseltamivir]    Extreme weakness and joint instability   Pregabalin Other (See Comments)   JOINT PAIN AND WEAKNESS        Medication List        Accurate as of May 03, 2023 11:59 PM. If you have any questions, ask your nurse or doctor.           STOP taking these medications    BLUE-EMU SUPER STRENGTH EX   simethicone 125 MG chewable tablet Commonly known as: MYLICON       TAKE these medications    acetaminophen 500 MG tablet Commonly known as: TYLENOL Take 2 tablets (1,000 mg total) by mouth 3 (three) times daily.   amLODipine 10 MG tablet Commonly known as: NORVASC TAKE ONE TABLET EVERY EVENING   amoxicillin-clavulanate 875-125 MG tablet Commonly known as: AUGMENTIN Take 1 tablet by mouth 2 (two) times daily. Take all of this medication   aspirin EC 81 MG tablet Commonly known as: Aspirin Adult Low Dose Take 1 tablet (81 mg total) by  mouth daily. Swallow whole.   CAL-MAG-ZINC PO Take 1 tablet by mouth in the morning and at bedtime.   CANNABIDIOL PO Take 10 drops by mouth in the morning and at bedtime. CBD OIL   ELDERBERRY PO Take 15 mLs by mouth daily. In the morning   Elderberry 500 MG Caps   fenofibrate 160 MG tablet Take 1 tablet (160 mg total) by mouth daily. For cholesterol and triglyceride   fluticasone 50 MCG/ACT nasal spray Commonly known as: FLONASE Place 1-2 sprays into both nostrils daily as needed for allergies or rhinitis (sinus/congestion.).   gabapentin 300 MG capsule Commonly known as: NEURONTIN 1 at bedtime for 2 weeks then one twice a day.   metoprolol succinate 50 MG 24 hr tablet Commonly known as: TOPROL-XL TAKE ONE TABLET DAILY FOR BLOOD PRESSURE   olmesartan 40 MG tablet Commonly known as: BENICAR Take 1 tablet (40 mg total) by mouth daily. What changed: See the new instructions.         Follow-up: Return in about 6 months (around 10/31/2023).  [name], M.D.

## 2023-05-04 LAB — LIPID PANEL
Cholesterol, Total: 206 mg/dL — ABNORMAL HIGH (ref 100–199)
HDL: 44 mg/dL (ref >39)
LDL CALC COMMENT:: 4.7 ratio (ref 0.0–5.0)
LDL Chol Calc (NIH): 113 mg/dL — ABNORMAL HIGH (ref 0–99)
Triglycerides: 284 mg/dL — ABNORMAL HIGH (ref 0–149)
VLDL Cholesterol Cal: 49 mg/dL — ABNORMAL HIGH (ref 5–40)

## 2023-05-04 LAB — CBC WITH DIFFERENTIAL/PLATELET
Basophils Absolute: 0 10*3/uL (ref 0.0–0.2)
Basos: 1 %
EOS (ABSOLUTE): 0.1 10*3/uL (ref 0.0–0.4)
Eos: 2 %
Hematocrit: 42.3 % (ref 37.5–51.0)
Hemoglobin: 14.2 g/dL (ref 13.0–17.7)
Immature Grans (Abs): 0 10*3/uL (ref 0.0–0.1)
Immature Granulocytes: 0 %
Lymphocytes Absolute: 2.1 10*3/uL (ref 0.7–3.1)
Lymphs: 34 %
MCH: 32.9 pg (ref 26.6–33.0)
MCHC: 33.6 g/dL (ref 31.5–35.7)
MCV: 98 fL — ABNORMAL HIGH (ref 79–97)
Monocytes Absolute: 0.5 10*3/uL (ref 0.1–0.9)
Monocytes: 8 %
Neutrophils Absolute: 3.4 10*3/uL (ref 1.4–7.0)
Neutrophils: 55 %
Platelets: 245 10*3/uL (ref 150–450)
RBC: 4.32 x10E6/uL (ref 4.14–5.80)
RDW: 13 % (ref 11.6–15.4)
WBC: 6.2 10*3/uL (ref 3.4–10.8)

## 2023-05-04 LAB — CMP14+EGFR
ALT: 17 IU/L (ref 0–44)
AST: 27 IU/L (ref 0–40)
Albumin: 4.7 g/dL (ref 3.8–4.8)
Alkaline Phosphatase: 45 IU/L (ref 44–121)
BUN/Creatinine Ratio: 15 (ref 10–24)
BUN: 19 mg/dL (ref 8–27)
Bilirubin Total: 0.4 mg/dL (ref 0.0–1.2)
CO2: 22 mmol/L (ref 20–29)
Calcium: 9.8 mg/dL (ref 8.6–10.2)
Chloride: 103 mmol/L (ref 96–106)
Creatinine, Ser: 1.31 mg/dL — ABNORMAL HIGH (ref 0.76–1.27)
Globulin, Total: 1.9 g/dL (ref 1.5–4.5)
Glucose: 91 mg/dL (ref 70–99)
Potassium: 4.4 mmol/L (ref 3.5–5.2)
Sodium: 141 mmol/L (ref 134–144)
Total Protein: 6.6 g/dL (ref 6.0–8.5)
eGFR: 55 mL/min/{1.73_m2} — ABNORMAL LOW (ref >59)

## 2023-05-07 ENCOUNTER — Encounter: Payer: Self-pay | Admitting: Family Medicine

## 2023-05-07 MED ORDER — OLMESARTAN MEDOXOMIL 40 MG PO TABS: 40.0000 mg | ORAL_TABLET | Freq: Every day | ORAL | 3 refills | Status: AC

## 2023-05-21 ENCOUNTER — Other Ambulatory Visit: Payer: Self-pay | Admitting: Family Medicine

## 2023-05-21 DIAGNOSIS — I10 Essential (primary) hypertension: Secondary | ICD-10-CM

## 2023-06-04 ENCOUNTER — Other Ambulatory Visit: Payer: Self-pay | Admitting: Family Medicine

## 2023-08-06 ENCOUNTER — Other Ambulatory Visit: Payer: Self-pay | Admitting: Family Medicine

## 2023-09-04 ENCOUNTER — Ambulatory Visit (INDEPENDENT_AMBULATORY_CARE_PROVIDER_SITE_OTHER): Payer: Medicare Other

## 2023-09-04 VITALS — BP 127/63 | HR 67 | Ht 70.0 in | Wt 215.0 lb

## 2023-09-04 DIAGNOSIS — Z0000 Encounter for general adult medical examination without abnormal findings: Secondary | ICD-10-CM | POA: Diagnosis not present

## 2023-09-04 NOTE — Patient Instructions (Signed)
 Mr. Powe , Thank you for taking time out of your busy schedule to complete your Annual Wellness Visit with me. I enjoyed our conversation and look forward to speaking with you again next year. I, as well as your care team,  appreciate your ongoing commitment to your health goals. Please review the following plan we discussed and let me know if I can assist you in the future. Your Game plan/ To Do List     Follow up Visits: Next Medicare AWV with our clinical staff: 09/04/24 at 1:10p.m.   Next Office Visit with your provider: 10/31/23 at 1:10pm.m  Clinician Recommendations:  Aim for 30 minutes of exercise or brisk walking, 6-8 glasses of water, and 5 servings of fruits and vegetables each day.       This is a list of the screening recommended for you and due dates:  Health Maintenance  Topic Date Due   Zoster (Shingles) Vaccine (1 of 2) Never done   COVID-19 Vaccine (3 - 2024-25 season) 09/19/2024*   Medicare Annual Wellness Visit  09/03/2024   DTaP/Tdap/Td vaccine (2 - Td or Tdap) 08/15/2032   Pneumococcal Vaccine for age over 46  Completed   HPV Vaccine  Aged Out   Meningitis B Vaccine  Aged Out   Flu Shot  Discontinued  *Topic was postponed. The date shown is not the original due date.    Advanced directives: (Copy Requested) Please bring a copy of your health care power of attorney and living will to the office to be added to your chart at your convenience. You can mail to [facility] [address] [place], [place] [address] or email to [email or web address] Advance Care Planning is important because it:  [x]  Makes sure you receive the medical care that is consistent with your values, goals, and preferences  [x]  It provides guidance to your family and loved ones and reduces their decisional burden about whether or not they are making the right decisions based on your wishes.  Follow the link provided in your after visit summary or read over the paperwork we  have mailed to you to help you started getting your Advance Directives in place. If you need assistance in completing these, please reach out to us  so that we can help you!  See attachments for Preventive Care and Fall Prevention Tips.

## 2023-09-04 NOTE — Progress Notes (Signed)
 Subjective:   Jesse Barton. is a 83 y.o. who presents for a Medicare Wellness preventive visit.  As a reminder, Annual Wellness Visits don't include a physical exam, and some assessments may be limited, especially if this visit is performed virtually. We may recommend an in-person follow-up visit with your provider if needed.  Visit Complete: Virtual I connected with  Jesse Barton. on 09/04/23 by a audio enabled telemedicine application and verified that I am speaking with the correct person using two identifiers.  Patient Location: Home  Provider Location: Home Office  I discussed the limitations of evaluation and management by telemedicine. The patient expressed understanding and agreed to proceed.  Vital Signs: Because this visit was a virtual/telehealth visit, some criteria may be missing or patient reported. Any vitals not documented were not able to be obtained and vitals that have been documented are patient reported.  VideoDeclined- This patient declined Librarian, academic. Therefore the visit was completed with audio only.  Persons Participating in Visit: Patient.  AWV Questionnaire: No: Patient Medicare AWV questionnaire was not completed prior to this visit.  Cardiac Risk Factors include: advanced age (>67men, >6 women);male gender;obesity (BMI >30kg/m2);dyslipidemia;hypertension     Objective:    Today's Vitals   09/04/23 1235 09/04/23 1238  BP: 127/63   Pulse: 67   Weight: 215 lb (97.5 kg)   Height: 5' 10 (1.778 m)   PainSc:  4    Body mass index is 30.85 kg/m.     09/04/2023   12:55 PM 09/01/2022    1:25 PM 05/21/2019   11:45 AM 05/12/2019    1:12 PM 12/17/2018    9:25 AM 11/15/2018   10:37 AM 11/06/2018    1:30 PM  Advanced Directives  Does Patient Have a Medical Advance Directive? Yes No No Yes Yes Yes Yes  Type of Advance Directive Living will  Healthcare Power of [place];Living will Living will;Healthcare Power of  Attorney Living will Living will Living will  Does patient want to make changes to medical advance directive?     No - Patient declined No - Patient declined No - Patient declined   Copy of Healthcare Power of Attorney in Chart?   No - copy requested      Would patient like information on creating a medical advance directive?  No - Patient declined No - Patient declined    No - Patient declined      Data saved with a previous flowsheet row definition    Current Medications (verified) Outpatient Encounter Medications as of 09/04/2023  Medication Sig   acetaminophen  (TYLENOL ) 500 MG tablet Take 2 tablets (1,000 mg total) by mouth 3 (three) times daily.   amLODipine  (NORVASC ) 10 MG tablet TAKE ONE TABLET EVERY EVENING   amoxicillin -clavulanate (AUGMENTIN ) 875-125 MG tablet Take 1 tablet by mouth 2 (two) times daily. Take all of this medication   aspirin  (ASPIRIN  ADULT LOW DOSE) 81 MG EC tablet Take 1 tablet (81 mg total) by mouth daily. Swallow whole.   Calcium -Magnesium-Zinc (CAL-MAG-ZINC PO) Take 1 tablet by mouth in the morning and at bedtime.   CANNABIDIOL PO Take 10 drops by mouth in the morning and at bedtime. CBD OIL   Elderberry 500 MG CAPS    ELDERBERRY PO Take 15 mLs by mouth daily. In the morning   fenofibrate  160 MG tablet TAKE ONE TABLET DAILY FOR CHOLESTEROL AND triglyceride   fluticasone (FLONASE) 50 MCG/ACT nasal spray Place 1-2 sprays into both  nostrils daily as needed for allergies or rhinitis (sinus/congestion.).   metoprolol  succinate (TOPROL -XL) 50 MG 24 hr tablet TAKE ONE TABLET DAILY FOR BLOOD PRESSURE   olmesartan  (BENICAR ) 40 MG tablet Take 1 tablet (40 mg total) by mouth daily.   gabapentin  (NEURONTIN ) 300 MG capsule 1 at bedtime for 2 weeks then one twice a day. (Patient not taking: Reported on 09/04/2023)   No facility-administered encounter medications on file as of 09/04/2023.    Allergies (verified) Hydrochlorothiazide, Ciprofloxacin , Lipitor  [atorvastatin   calcium ], Tamiflu  [oseltamivir ], and Pregabalin    History: Past Medical History:  Diagnosis Date   Arthritis    Cancer (HCC)    Kidney   History of kidney stones    History of nephrolithiasis 10/21/2018   Hypertension    Kidney stone    Sciatic nerve pain    Past Surgical History:  Procedure Laterality Date   CATARACT EXTRACTION     CYSTOSCOPY/URETEROSCOPY/HOLMIUM LASER/STENT PLACEMENT Right 11/19/2018   Procedure: CYSTOSCOPY/RETROGRADE/URETEROSCOPY/HOLMIUM LASER/STENT PLACEMENT;  Surgeon: Christina Coyer, MD;  Location: [facility];  Service: Urology;  Laterality: Right;   CYSTOSCOPY/URETEROSCOPY/HOLMIUM LASER/STENT PLACEMENT Right 12/17/2018   Procedure: CYSTOSCOP RIGHT  /URETEROSCOPY/HOLMIUM LASER/STENT EXCHANGE;  Surgeon: Christina Coyer, MD;  Location: [facility];  Service: Urology;  Laterality: Right;   EYE SURGERY Bilateral    cataracts   KIDNEY SURGERY     NEPHRECTOMY Left    SUPERFICIAL PERONEAL NERVE RELEASE Right 05/21/2019   Procedure: Right peroneal nerve decompression;  Surgeon: Liliane Rei, MD;  Location: [facility];  Service: Orthopedics;  Laterality: Right;    Family History  Problem Relation Age of Onset   Cancer Mother    Leukemia Father    Schizophrenia Sister    Cancer Sister 21       kidney   Social History   Socioeconomic History   Marital status: Married    Spouse name: Not on file   Number of children: 2   Years of education: 13   Highest education level: Some college, no degree  Occupational History   Not on file  Tobacco Use   Smoking status: Light Smoker    Types: Cigars   Smokeless tobacco: Never   Tobacco comments:    smokes  for the last years   Vaping Use   Vaping status: Never Used  Substance and Sexual Activity   Alcohol use: Yes    Comment: occasional   Drug use: Yes    Types: Marijuana    Comment: cbd oil   Sexual activity: Not on file  Other Topics Concern   Not on file  Social History Narrative   Not  on file   Social Drivers of Health   Financial Resource Strain: Low Risk  (09/04/2023)   Overall Financial Resource Strain (CARDIA)    Difficulty of Paying Living Expenses: Not hard at all  Food Insecurity: No Food Insecurity (09/04/2023)   Hunger Vital Sign    Worried About Running Out of Food in the Last Year: Never true    Ran Out of Food in the Last Year: Never true  Transportation Needs: No Transportation Needs (09/04/2023)   PRAPARE - Administrator, Civil Service (Medical): No    Lack of Transportation (Non-Medical): No  Physical Activity: Insufficiently Active (09/04/2023)   Exercise Vital Sign    Days of Exercise per Week: 7 days    Minutes of Exercise per Session: 20 min  Stress: No Stress Concern Present (09/04/2023)   [employer] of  Occupational Health - Occupational Stress Questionnaire    Feeling of Stress: Not at all  Social Connections: Moderately Isolated (09/04/2023)   Social Connection and Isolation Panel    Frequency of Communication with Friends and Family: More than three times a week    Frequency of Social Gatherings with Friends and Family: More than three times a week    Attends Religious Services: Never    Database administrator or Organizations: No    Attends Engineer, structural: Never    Marital Status: Married    Tobacco Counseling Ready to quit: Not Answered Counseling given: Yes Tobacco comments: smokes  for the last years     Clinical Intake:  Pre-visit preparation completed: Yes  Pain : 0-10 (r-leg/back) Pain Score: 4  Pain Type: Chronic pain Pain Location: Back (R-leg/back) Pain Orientation: Right Pain Descriptors / Indicators: Aching Pain Onset: Other (comment) Pain Frequency: Constant Pain Relieving Factors: tylenol   Pain Relieving Factors: tylenol   BMI - recorded: 30.85 Nutritional Status: BMI > 30  Obese Nutritional Risks: None Diabetes: No  Lab Results  Component Value Date   HGBA1C 5.5  05/01/2016     How often do you need to have someone help you when you read instructions, pamphlets, or other written materials from your doctor or pharmacy?: 1 - Never  Interpreter Needed?: No  Information entered by :: Alia t/cma   Activities of Daily Living     09/04/2023   12:49 PM  In your present state of health, do you have any difficulty performing the following activities:  Hearing? 0  Vision? 0  Difficulty concentrating or making decisions? 0  Walking or climbing stairs? 1  Comment need somethig to hold on to  Dressing or bathing? 0  Doing errands, shopping? 0  Preparing Food and eating ? N  Using the Toilet? N  In the past six months, have you accidently leaked urine? N  Do you have problems with loss of bowel control? N  Managing your Medications? N  Managing your Finances? N  Housekeeping or managing your Housekeeping? N    Patient Care Team: Roise Cleaver, MD as PCP - General (Family Medicine)  I have updated your Care Teams any recent Medical Services you may have received from other providers in the past year.     Assessment:   This is a routine wellness examination for Jesse Barton.  Hearing/Vision screen Hearing Screening - Comments:: Pt denies hearing aids Vision Screening - Comments:: Pt could sees 20/20/ pt goes Dr. Micael Adas in [place]/last ov couple years/going to get an appt soon   Goals Addressed             This Visit's Progress    Exercise 150 min/wk Moderate Activity   On track      Depression Screen     09/04/2023   12:57 PM 05/03/2023    1:37 PM 10/16/2022   10:42 AM 09/13/2022    1:22 PM 09/01/2022    1:24 PM 08/16/2022   11:41 AM 06/27/2022   11:23 AM  PHQ 2/9 Scores  PHQ - 2 Score 0 0 0 0 0 0 0  PHQ- 9 Score 1 0         Fall Risk     09/04/2023   12:36 PM 10/16/2022   10:42 AM 09/13/2022    1:22 PM 09/01/2022    1:23 PM 08/16/2022   11:41 AM  Fall Risk   Falls in the past year? 0 0 0 0  0  Number falls in past yr: 0   0   Injury  with Fall? 0   0   Risk for fall due to : No Fall Risks   No Fall Risks   Follow up Falls evaluation completed   Falls prevention discussed     MEDICARE RISK AT HOME:  Medicare Risk at Home Any stairs in or around the home?: No If so, are there any without handrails?: No Home free of loose throw rugs in walkways, pet beds, electrical cords, etc?: Yes Adequate lighting in your home to reduce risk of falls?: Yes Life alert?: No Use of a cane, walker or w/c?: Yes (walker/sticks) Grab bars in the bathroom?: No Shower chair or bench in shower?: No Elevated toilet seat or a handicapped toilet?: No  TIMED UP AND GO:  Was the test performed?  no  Cognitive Function: 6CIT completed    05/31/2017   12:01 PM  MMSE - Mini Mental State Exam  Orientation to time 5  Orientation to Place 5  Registration 3  Attention/ Calculation 5  Recall 1  Language- name 2 objects 2  Language- repeat 1  Language- follow 3 step command 3  Language- read & follow direction 1  Write a sentence 1  Copy design 1  Total score 28        09/04/2023   12:59 PM 09/01/2022    1:26 PM  6CIT Screen  What Year? 0 points 0 points  What month? 0 points 0 points  What time? 0 points 0 points  Count back from 20 0 points 0 points  Months in reverse 0 points 0 points  Repeat phrase 0 points 0 points  Total Score 0 points 0 points    Immunizations Immunization History  Administered Date(s) Administered   Fluad Quad(high Dose 65+) 03/01/2020, 12/16/2020, 12/19/2021   Fluad Trivalent(High Dose 65+) 05/03/2023   Influenza,inj,Quad PF,6+ Mos 03/11/2015   Moderna Sars-Covid-2 Vaccination 06/19/2019, 07/17/2019   Pneumococcal Conjugate-13 05/16/2019   Pneumococcal Polysaccharide-23 08/30/2020   Tdap 08/16/2022    Screening Tests Health Maintenance  Topic Date Due   Zoster Vaccines- Shingrix (1 of 2) Never done   COVID-19 Vaccine (3 - 2024-25 season) 09/19/2024 (Originally 11/19/2022)   Medicare Annual  Wellness (AWV)  09/03/2024   DTaP/Tdap/Td (2 - Td or Tdap) 08/15/2032   Pneumococcal Vaccine: 50+ Years  Completed   HPV VACCINES  Aged Out   Meningococcal B Vaccine  Aged Out   INFLUENZA VACCINE  Discontinued    Health Maintenance  Health Maintenance Due  Topic Date Due   Zoster Vaccines- Shingrix (1 of 2) Never done   Health Maintenance Items Addressed: See Nurse Notes at the end of this note  Additional Screening:  Vision Screening: Recommended annual ophthalmology exams for early detection of glaucoma and other disorders of the eye. Would you like a referral to an eye doctor? No    Dental Screening: Recommended annual dental exams for proper oral hygiene  Community Resource Referral / Chronic Care Management: CRR required this visit?  No   CCM required this visit?  No   Plan:    I have personally reviewed and noted the following in the patient's chart:   Medical and social history Use of alcohol, tobacco or illicit drugs  Current medications and supplements including opioid prescriptions. Patient is not currently taking opioid prescriptions. Functional ability and status Nutritional status Physical activity Advanced directives List of other physicians Hospitalizations, surgeries, and ER visits in  previous 12 months Vitals Screenings to include cognitive, depression, and falls Referrals and appointments  In addition, I have reviewed and discussed with patient certain preventive protocols, quality metrics, and best practice recommendations. A written personalized care plan for preventive services as well as general preventive health recommendations were provided to patient.   Michaelle Adolphus, CMA   09/04/2023   After Visit Summary: (Declined) Due to this being a telephonic visit, with patients personalized plan was offered to patient but patient Declined AVS at this time   Notes: pt is due Shingles vaccine, however, per pt stated that he already had the shots 78yrs  ago plus he already had the Shingles. There is no record, suggest pt talk to pcp for recommendations? Per pt aware due for eye exam, will make an appt soon.

## 2023-09-17 ENCOUNTER — Telehealth: Payer: Self-pay

## 2023-09-17 NOTE — Telephone Encounter (Signed)
 Spoke to patient to confirm insurance. Added insurance to DOS for AWV and will refile

## 2023-09-17 NOTE — Telephone Encounter (Signed)
 Copied from CRM [id number]. Topic: General - Other >> Sep 17, 2023  2:16 PM Miquel SAILOR wrote: Reason for CRM: Patient received bill for his AWV on 06/17 patient recieved bill for 328.00. Needs call back on issue. [phone]

## 2023-10-31 ENCOUNTER — Ambulatory Visit: Payer: Medicare Other | Admitting: Family Medicine

## 2023-10-31 ENCOUNTER — Encounter: Payer: Self-pay | Admitting: Family Medicine

## 2023-10-31 VITALS — BP 148/74 | HR 79 | Temp 97.7°F | Ht 70.0 in | Wt 207.6 lb

## 2023-10-31 DIAGNOSIS — I10 Essential (primary) hypertension: Secondary | ICD-10-CM

## 2023-10-31 DIAGNOSIS — E782 Mixed hyperlipidemia: Secondary | ICD-10-CM | POA: Diagnosis not present

## 2023-10-31 DIAGNOSIS — Z23 Encounter for immunization: Secondary | ICD-10-CM

## 2023-10-31 MED ORDER — METOPROLOL SUCCINATE ER 50 MG PO TB24: 50.0000 mg | ORAL_TABLET | Freq: Every day | ORAL | 3 refills | Status: AC

## 2023-10-31 MED ORDER — AMLODIPINE BESYLATE 10 MG PO TABS
10.0000 mg | ORAL_TABLET | Freq: Every evening | ORAL | 3 refills | Status: AC
Start: 2023-10-31 — End: ?

## 2023-10-31 NOTE — Addendum Note (Signed)
 Addended by: JODENE CORNERS B on: 10/31/2023 02:58 PM   Modules accepted: Orders

## 2023-10-31 NOTE — Progress Notes (Signed)
 Subjective:  Patient ID: Jesse JAYSON Seward Mickey., male    DOB: 08/02/1940  Age: 83 y.o. MRN: [id number]  CC: 6 month follow up   HPI  Discussed the use of AI scribe software for clinical note transcription with the patient, who gave verbal consent to proceed.  History of Present Illness   Jesse Baumler. is an 83 year old male with hypertension and neuropathy who presents for a follow-up visit.  He experiences leg cramps, particularly at night, which he associates with the use of gabapentin . He had been on gabapentin  for about three years without issues, but upon resuming it recently, he noticed the onset of cramps, especially in his right leg. He discontinued gabapentin , yet the cramps persist. He manages the cramps by taking a spoonful of yellow mustard, which provides some relief.  He is currently taking fenofibrate  for cholesterol and a combination of amlodipine , metoprolol , and olmesartan  for blood pressure management. No dizziness upon standing.  He is the primary caregiver for his 98 year old sister who has paranoid schizophrenia. He assists her with daily activities such as food, groceries, and laundry, which he finds physically demanding given his own health conditions.             10/31/2023    1:13 PM 09/04/2023   12:57 PM 05/03/2023    1:37 PM  Depression screen PHQ 2/9  Decreased Interest 0 0 0  Down, Depressed, Hopeless 0 0 0  PHQ - 2 Score 0 0 0  Altered sleeping  0 0  Tired, decreased energy  1 0  Change in appetite  0 0  Feeling bad or failure about yourself   0 0  Trouble concentrating  0 0  Moving slowly or fidgety/restless  0 0  Suicidal thoughts  0 0  PHQ-9 Score  1 0  Difficult doing work/chores  Not difficult at all Not difficult at all    History Jesse Barton has a past medical history of Arthritis, Cancer (HCC), History of kidney stones, History of nephrolithiasis (10/21/2018), Hypertension, Kidney stone, and Sciatic nerve pain.   He has a past surgical  history that includes Kidney surgery; Eye surgery (Bilateral); Nephrectomy (Left); Cataract extraction; Cystoscopy/ureteroscopy/holmium laser/stent placement (Right, 11/19/2018); Cystoscopy/ureteroscopy/holmium laser/stent placement (Right, 12/17/2018); and Superficial peroneal nerve release (Right, 05/21/2019).   His family history includes Cancer in his mother; Cancer (age of onset: 17) in his sister; Leukemia in his father; Schizophrenia in his sister.He reports that he has been smoking cigars. He has never used smokeless tobacco. He reports current alcohol use. He reports current drug use. Drug: Marijuana.    ROS Review of Systems  Constitutional:  Negative for fever.  Respiratory:  Negative for shortness of breath.   Cardiovascular:  Negative for chest pain.  Musculoskeletal:  Negative for arthralgias.  Skin:  Negative for rash.    Objective:  BP (!) 148/74   Pulse 79   Temp 97.7 F (36.5 C)   Ht 5' 10 (1.778 m)   Wt 207 lb 9.6 oz (94.2 kg)   SpO2 96%   BMI 29.79 kg/m   BP Readings from Last 3 Encounters:  10/31/23 (!) 148/74  09/04/23 127/63  05/03/23 127/63    Wt Readings from Last 3 Encounters:  10/31/23 207 lb 9.6 oz (94.2 kg)  09/04/23 215 lb (97.5 kg)  05/03/23 215 lb (97.5 kg)     Physical Exam Vitals reviewed.  Constitutional:      Appearance: He is well-developed.  HENT:  Head: Normocephalic and atraumatic.     Right Ear: External ear normal.     Left Ear: External ear normal.     Mouth/Throat:     Pharynx: No oropharyngeal exudate or posterior oropharyngeal erythema.  Eyes:     Pupils: Pupils are equal, round, and reactive to light.  Cardiovascular:     Rate and Rhythm: Normal rate and regular rhythm.     Heart sounds: No murmur heard. Pulmonary:     Effort: No respiratory distress.     Breath sounds: Normal breath sounds.  Musculoskeletal:     Cervical back: Normal range of motion and neck supple.  Neurological:     Mental Status: He is alert  and oriented to person, place, and time.      Assessment & Plan:  Primary hypertension -     CBC with Differential/Platelet -     CMP14+EGFR -     amLODIPine  Besylate; Take 1 tablet (10 mg total) by mouth every evening.  Dispense: 90 tablet; Refill: 3  Mixed hyperlipidemia -     Lipid panel  Other orders -     Metoprolol  Succinate ER; Take 1 tablet (50 mg total) by mouth daily. Take with or immediately following a meal.  Dispense: 90 tablet; Refill: 3    Assessment and Plan    Essential hypertension Blood pressure slightly elevated at 147 mmHg, possibly due to stress. Current medications: amlodipine , metoprolol , olmesartan . - Refilled amlodipine  and metoprolol  prescriptions. - Schedule head-to-toe physical in six months.  Peripheral neuropathy with leg cramps Leg cramps persist, particularly at night. Gabapentin  discontinuation did not resolve cramps, indicating it was not the cause. Discussed ropinirole and its side effects, but he prefers trying mustard first. - Try spoonful of mustard at bedtime for leg cramps. - Consider ropinirole if mustard ineffective. - Monitor for ropinirole side effects if initiated.  Hyperlipidemia  General Health Maintenance Discussed shingles vaccine importance due to past shingles history. - Administer shingles vaccine after completing paperwork.          Follow-up: Return in about 6 months (around 05/02/2024) for [facility] physical.  Butler Der, M.D.

## 2023-11-01 LAB — CMP14+EGFR
ALT: 13 IU/L (ref 0–44)
AST: 21 IU/L (ref 0–40)
Albumin: 4.7 g/dL (ref 3.8–4.8)
Alkaline Phosphatase: 42 IU/L — ABNORMAL LOW (ref 44–121)
BUN/Creatinine Ratio: 16 (ref 10–24)
BUN: 22 mg/dL (ref 8–27)
Bilirubin Total: 0.3 mg/dL (ref 0.0–1.2)
CO2: 23 mmol/L (ref 20–29)
Calcium: 10.6 mg/dL — ABNORMAL HIGH (ref 8.6–10.2)
Chloride: 105 mmol/L (ref 96–106)
Creatinine, Ser: 1.39 mg/dL — ABNORMAL HIGH (ref 0.76–1.27)
Globulin, Total: 2.3 g/dL (ref 1.5–4.5)
Glucose: 101 mg/dL — ABNORMAL HIGH (ref 70–99)
Potassium: 4.9 mmol/L (ref 3.5–5.2)
Sodium: 144 mmol/L (ref 134–144)
Total Protein: 7 g/dL (ref 6.0–8.5)
eGFR: 51 mL/min/1.73 — ABNORMAL LOW (ref >59)

## 2023-11-01 LAB — CBC WITH DIFFERENTIAL/PLATELET
Basophils Absolute: 0.1 x10E3/uL (ref 0.0–0.2)
Basos: 1 %
EOS (ABSOLUTE): 0.1 x10E3/uL (ref 0.0–0.4)
Eos: 2 %
Hematocrit: 43.4 % (ref 37.5–51.0)
Hemoglobin: 14.2 g/dL (ref 13.0–17.7)
Immature Grans (Abs): 0 x10E3/uL (ref 0.0–0.1)
Immature Granulocytes: 0 %
Lymphocytes Absolute: 2.1 x10E3/uL (ref 0.7–3.1)
Lymphs: 28 %
MCH: 33.1 pg — ABNORMAL HIGH (ref 26.6–33.0)
MCHC: 32.7 g/dL (ref 31.5–35.7)
MCV: 101 fL — ABNORMAL HIGH (ref 79–97)
Monocytes Absolute: 0.5 x10E3/uL (ref 0.1–0.9)
Monocytes: 8 %
Neutrophils Absolute: 4.4 x10E3/uL (ref 1.4–7.0)
Neutrophils: 61 %
Platelets: 246 x10E3/uL (ref 150–450)
RBC: 4.29 x10E6/uL (ref 4.14–5.80)
RDW: 13.2 % (ref 11.6–15.4)
WBC: 7.2 x10E3/uL (ref 3.4–10.8)

## 2023-11-01 LAB — LIPID PANEL
Chol/HDL Ratio: 4.9 ratio (ref 0.0–5.0)
Cholesterol, Total: 205 mg/dL — ABNORMAL HIGH (ref 100–199)
HDL: 42 mg/dL (ref >39)
LDL Chol Calc (NIH): 120 mg/dL — ABNORMAL HIGH (ref 0–99)
Triglycerides: 247 mg/dL — ABNORMAL HIGH (ref 0–149)
VLDL Cholesterol Cal: 43 mg/dL — ABNORMAL HIGH (ref 5–40)

## 2023-11-04 ENCOUNTER — Ambulatory Visit: Payer: Self-pay | Admitting: Family Medicine

## 2023-11-05 NOTE — Telephone Encounter (Signed)
 Patient aware and verbalized understanding.

## 2024-01-03 ENCOUNTER — Ambulatory Visit: Admitting: Family Medicine

## 2024-01-03 ENCOUNTER — Encounter: Payer: Self-pay | Admitting: Family Medicine

## 2024-01-03 VITALS — BP 129/69 | HR 78 | Temp 98.5°F | Ht 70.0 in | Wt 205.2 lb

## 2024-01-03 DIAGNOSIS — R197 Diarrhea, unspecified: Secondary | ICD-10-CM

## 2024-01-03 MED ORDER — DIPHENOXYLATE-ATROPINE 2.5-0.025 MG PO TABS: 2.0000 | ORAL_TABLET | Freq: Four times a day (QID) | ORAL | 0 refills | Status: DC | PRN

## 2024-01-03 NOTE — Progress Notes (Signed)
 Subjective:  Patient ID: Jesse JAYSON Seward Mickey., male    DOB: 10/27/1940  Age: 83 y.o. MRN: [id number]  CC: Diarrhea   HPI  Discussed the use of AI scribe software for clinical note transcription with the patient, who gave verbal consent to proceed.  History of Present Illness Jesse Onorato. is an 83 year old male who presents with diarrhea and gas issues.  He has been experiencing diarrhea for the past two weeks, initially lasting three to four days before resolving, and then recurring a couple of days ago. The diarrhea worsens in the afternoon after meals, particularly after consuming foods like bagels and chicken noodle soup. He describes the bowel movements as 'real soft' and notes frequent nighttime bathroom visits after eating.  There is no associated pain or cramping with the bowel movements, but he experiences a sensation of gas that he is unable to pass. He finds relief when he is able to pass gas, describing it as feeling 'so good'.  He has been taking Imodium since the weekend to manage the diarrhea but is hesitant to take Gas-X due to concerns about mixing medications, although he typically takes Gas-X daily. No reflux, ulcer, or heartburn symptoms.  His past medical history includes kidney removal, after which he experienced issues with passing gas.          01/03/2024   11:38 AM 10/31/2023    1:13 PM 09/04/2023   12:57 PM  Depression screen PHQ 2/9  Decreased Interest 0 0 0  Down, Depressed, Hopeless 0 0 0  PHQ - 2 Score 0 0 0  Altered sleeping   0  Tired, decreased energy   1  Change in appetite   0  Feeling bad or failure about yourself    0  Trouble concentrating   0  Moving slowly or fidgety/restless   0  Suicidal thoughts   0  PHQ-9 Score   1  Difficult doing work/chores   Not difficult at all    History Jesse Barton has a past medical history of Arthritis, Cancer (HCC), History of kidney stones, History of nephrolithiasis (10/21/2018), Hypertension, Kidney  stone, and Sciatic nerve pain.   He has a past surgical history that includes Kidney surgery; Eye surgery (Bilateral); Nephrectomy (Left); Cataract extraction; Cystoscopy/ureteroscopy/holmium laser/stent placement (Right, 11/19/2018); Cystoscopy/ureteroscopy/holmium laser/stent placement (Right, 12/17/2018); and Superficial peroneal nerve release (Right, 05/21/2019).   His family history includes Cancer in his mother; Cancer (age of onset: 62) in his sister; Leukemia in his father; Schizophrenia in his sister.He reports that he has been smoking cigars. He has never used smokeless tobacco. He reports current alcohol use. He reports current drug use. Drug: Marijuana.    ROS Review of Systems  Constitutional: Negative.   HENT: Negative.    Eyes:  Negative for visual disturbance.  Respiratory:  Negative for cough and shortness of breath.   Cardiovascular:  Negative for chest pain and leg swelling.  Gastrointestinal:  Positive for diarrhea and nausea. Negative for abdominal pain and vomiting.  Genitourinary:  Negative for difficulty urinating.  Musculoskeletal:  Negative for arthralgias and myalgias.  Skin:  Negative for rash.  Neurological:  Negative for headaches.  Psychiatric/Behavioral:  Negative for sleep disturbance.     Objective:  BP 129/69   Pulse 78   Temp 98.5 F (36.9 C)   Ht 5' 10 (1.778 m)   Wt 205 lb 3.2 oz (93.1 kg)   SpO2 96%   BMI 29.44 kg/m   BP Readings  from Last 3 Encounters:  01/03/24 129/69  10/31/23 (!) 148/74  09/04/23 127/63    Wt Readings from Last 3 Encounters:  01/03/24 205 lb 3.2 oz (93.1 kg)  10/31/23 207 lb 9.6 oz (94.2 kg)  09/04/23 215 lb (97.5 kg)     Physical Exam Physical Exam GENERAL: Alert, cooperative, well developed, no acute distress HEENT: Normocephalic, normal oropharynx, moist mucous membranes CHEST: Clear to auscultation bilaterally, No wheezes, rhonchi, or crackles CARDIOVASCULAR: Normal heart rate and rhythm, S1 and S2 normal  without murmurs ABDOMEN: Soft, non-tender, non-distended, without organomegaly, Normal bowel sounds EXTREMITIES: No cyanosis or edema NEUROLOGICAL: Cranial nerves grossly intact, Moves all extremities without gross motor or sensory deficit   Assessment & Plan:  Diarrhea, unspecified type -     US  Abdomen Complete; Future  Other orders -     Diphenoxylate-Atropine; Take 2 tablets by mouth 4 (four) times daily as needed for diarrhea or loose stools.  Dispense: 30 tablet; Refill: 0    Assessment and Plan Assessment & Plan Diarrhea with increased gas   He experiences intermittent diarrhea with increased gas, mainly in the afternoons and evenings after meals. There is no associated pain or cramping. Symptoms have been present intermittently for the past few weeks, possibly due to dietary habits and past nephrectomy. Prescribe Lomotil (diphenoxylate/atropine) to be taken up to four times daily, one or two tablets per dose. Advise taking Gas-X at the maximum dose allowed on the label before each meal. Order an abdominal ultrasound to investigate the cause of symptoms. Instruct to avoid concurrent use of Lomotil and Imodium; Gas-X is compatible with both. Send prescription to [facility].       Follow-up: No follow-ups on file.  Butler Der, M.D.

## 2024-01-07 ENCOUNTER — Telehealth: Payer: Self-pay | Admitting: Family Medicine

## 2024-01-07 DIAGNOSIS — Z0279 Encounter for issue of other medical certificate: Secondary | ICD-10-CM

## 2024-01-07 NOTE — Telephone Encounter (Signed)
 Markees Carns dropped off Handicap forms to be completed and signed.  Form Fee Paid? (Y/N)       Y     If NO, form is placed on front office manager desk to hold until payment received. If YES, then form will be placed in the RX/HH Nurse Coordinators box for completion.  Form will not be processed until payment is received

## 2024-01-07 NOTE — Telephone Encounter (Signed)
 Copied from CRM #[id number]. Topic: General - Other >> Jan 07, 2024 11:24 AM Harlene ORN wrote: Reason for CRM: Patient received a handicap parking placard in the mail, needs his PCP to sign the new placard to renew it. Will drop it off today.

## 2024-01-10 ENCOUNTER — Ambulatory Visit (HOSPITAL_COMMUNITY)
Admission: RE | Admit: 2024-01-10 | Discharge: 2024-01-10 | Disposition: A | Discharge status: Home / Self Care | Source: Ambulatory Visit | Attending: Family Medicine | Admitting: Family Medicine

## 2024-01-10 ENCOUNTER — Other Ambulatory Visit: Payer: Self-pay | Admitting: Family Medicine

## 2024-01-10 DIAGNOSIS — R197 Diarrhea, unspecified: Secondary | ICD-10-CM | POA: Insufficient documentation

## 2024-01-10 DIAGNOSIS — R109 Unspecified abdominal pain: Secondary | ICD-10-CM | POA: Diagnosis not present

## 2024-01-10 DIAGNOSIS — Z905 Acquired absence of kidney: Secondary | ICD-10-CM | POA: Diagnosis not present

## 2024-01-10 DIAGNOSIS — K8020 Calculus of gallbladder without cholecystitis without obstruction: Secondary | ICD-10-CM | POA: Diagnosis not present

## 2024-01-10 DIAGNOSIS — K838 Other specified diseases of biliary tract: Secondary | ICD-10-CM | POA: Diagnosis not present

## 2024-01-15 ENCOUNTER — Telehealth: Payer: Self-pay | Admitting: Family Medicine

## 2024-01-15 NOTE — Telephone Encounter (Signed)
 Contacted patient. Notified patient. Patient verbalized understanding.

## 2024-01-15 NOTE — Telephone Encounter (Signed)
 Copied from CRM #[id number]. Topic: Clinical - Lab/Test Results >> Jan 15, 2024 12:53 PM Tobias L wrote: Reason for CRM: Patient calling for ultrasound results. Requesting callback with results, [phone] (per patient can leave message on home number)

## 2024-01-15 NOTE — Telephone Encounter (Signed)
 Ultra sound results shows fatty liver- other wise normal. nothing can be done other than low fat diet and weight loss.

## 2024-01-17 ENCOUNTER — Ambulatory Visit: Payer: Self-pay | Admitting: Family Medicine

## 2024-01-17 ENCOUNTER — Other Ambulatory Visit: Payer: Self-pay | Admitting: Nurse Practitioner

## 2024-01-17 NOTE — Telephone Encounter (Signed)
 FYI Only or Action Required?: Action required by provider: medication refill request.or a new medication to help more  Patient was last seen in primary care on 01/03/2024 by [name], MD.  Called Nurse Triage reporting Diarrhea.  Symptoms began yesterday.  Interventions attempted: Prescription medications: lomotil.  Symptoms are: gradually worsening.  Triage Disposition: See HCP Within 4 Hours (Or PCP Triage)  Patient/caregiver understands and will follow disposition?: No, wishes to speak with PCP     Copied from CRM #[id number]. Topic: Clinical - Red Word Triage >> Jan 17, 2024 10:37 AM [name] wrote: Pt has diarrhea and says he's out of the medication and is running to the bathroom every 20 mins. Pt has lost about 20lbs in the last few weeks. Reason for Disposition  [1] SEVERE diarrhea (e.g., 7 or more times / day more than normal) AND [2] age > 60 years  Answer Assessment - Initial Assessment Questions Pt states that this all started after eating some strawberries on 28th of last month. He saw Dr. Zollie who gave him pills to take 2pills 4 times a day. He states he finished the second round yesterday. Last night it hit him again. States he was going every 30 minutes through the night, small amounts and now every 15-20 minutes. RN did offer appt today and he refused, afraid to have an accident. RN did give care instructions. During that conversation pt states he drinks the lemonade packets (sugar free) with water multiple times a day. RN advised often times artificial sugar is a trigger for diarrhea. Rn advised for him to mix gatorade and water so he can get some electrolytes back. Pt is requesting a stronger medication that lasts more than a few days. He states every time he finishes the medication the diarrhea comes right back.       1. DIARRHEA SEVERITY: How bad is the diarrhea? How many more stools have you had in the past 24 hours than normal?      Starting last night  he has been going every 30 minutes, this am it's about every 15 minutes 2. ONSET: When did the diarrhea begin?      Last month 3. STOOL DESCRIPTION:  How loose or watery is the diarrhea? What is the stool color? Is there any blood or mucous in the stool?     Really loose, small amounts 4. VOMITING: Are you also vomiting? If Yes, ask: How many times in the past 24 hours?      no 5. ABDOMEN PAIN: Are you having any abdomen pain? If Yes, ask: What does it feel like? (e.g., crampy, dull, intermittent, constant)      Cramping and gas 6. ABDOMEN PAIN SEVERITY: If present, ask: How bad is the pain?  (e.g., Scale 1-10; mild, moderate, or severe)     minimal 7. ORAL INTAKE: If vomiting, Have you been able to drink liquids? How much liquids have you had in the past 24 hours?     Drinking.  8. HYDRATION: Any signs of dehydration? (e.g., dry mouth [not just dry lips], too weak to stand, dizziness, new weight loss) When did you last urinate?     Mouth gets dry a little bit and then. He does feel like he is getting weak.   11. OTHER SYMPTOMS: Do you have any other symptoms? (e.g., fever, blood in stool)       no  Protocols used: Diarrhea-A-AH

## 2024-01-17 NOTE — Telephone Encounter (Signed)
 Pt. Needs to follow up in the office with me for further evaluation Thanks, WS

## 2024-01-18 NOTE — Telephone Encounter (Signed)
 Called pt and he will call on Monday to make appt if no better.

## 2024-01-18 NOTE — Telephone Encounter (Signed)
 Aware handicap form ready

## 2024-01-31 ENCOUNTER — Ambulatory Visit: Admitting: Family Medicine

## 2024-01-31 ENCOUNTER — Encounter: Payer: Self-pay | Admitting: Family Medicine

## 2024-01-31 ENCOUNTER — Other Ambulatory Visit: Payer: Self-pay

## 2024-01-31 VITALS — BP 132/69 | HR 83 | Temp 98.0°F | Ht 70.0 in | Wt 195.0 lb

## 2024-01-31 DIAGNOSIS — R197 Diarrhea, unspecified: Secondary | ICD-10-CM | POA: Diagnosis not present

## 2024-01-31 DIAGNOSIS — Z23 Encounter for immunization: Secondary | ICD-10-CM | POA: Diagnosis not present

## 2024-01-31 DIAGNOSIS — K5792 Diverticulitis of intestine, part unspecified, without perforation or abscess without bleeding: Secondary | ICD-10-CM

## 2024-01-31 MED ORDER — DIPHENOXYLATE-ATROPINE 2.5-0.025 MG PO TABS: 2.0000 | ORAL_TABLET | Freq: Four times a day (QID) | ORAL | 1 refills | Status: AC | PRN

## 2024-01-31 MED ORDER — METRONIDAZOLE 500 MG PO TABS: 500.0000 mg | ORAL_TABLET | Freq: Two times a day (BID) | ORAL | 0 refills | Status: DC

## 2024-01-31 MED ORDER — FENOFIBRATE 160 MG PO TABS: 160.0000 mg | ORAL_TABLET | Freq: Every day | ORAL | 2 refills | Status: AC

## 2024-01-31 MED ORDER — AMOXICILLIN 875 MG PO TABS: 875.0000 mg | ORAL_TABLET | Freq: Two times a day (BID) | ORAL | 0 refills | Status: AC

## 2024-01-31 NOTE — Progress Notes (Signed)
 Subjective:  Patient ID: Jesse Barton., male    DOB: [birth year]  Age: 83 y.o. MRN: [id number]  CC: Diarrhea (Diarrhea and bloating would get better and then one day he would eat something that would trigger it. Better the past two days. Started in September after eating strawberries. Having accidents at night while asleep./Certain foods make worse.)   HPI  Discussed the use of AI scribe software for clinical note transcription with the patient, who gave verbal consent to proceed.  History of Present Illness Jesse Barton. is an 83 year old male who presents with persistent diarrhea since December 16, 2023.  He has been experiencing persistent diarrhea since December 16, 2023, which began after attending a birthday party where he consumed pizza and strawberries. The diarrhea is described as very loose, resembling 'baby food,' but not watery. Episodes occur primarily in the afternoon after eating, with bowel movements occurring 10-12 times a day, and occasionally during the night.  He has been taking medication prescribed for diarrhea, two pills a day up to four times, which initially seemed to help by the third day, but symptoms returned after eating. He also tried Imodium prior to contacting the doctor, which he found somewhat effective. The diarrhea has significantly impacted his daily life, making him hesitant to leave the house for fear of needing a bathroom urgently.  He recalls a similar experience in the past after eating a hamburger, which was resolved with antibiotics. He mentions dietary changes, including avoiding afternoon meals after he was told he had a fatty liver. However, he has recently resumed a more normal diet and reports feeling better over the past two days.  No significant pain but occasional gas. He occasionally consumes alcohol, typically a small glass of wine or a drink of bourbon or moonshine.          01/03/2024   11:38 AM 10/31/2023    1:13 PM  09/04/2023   12:57 PM  Depression screen PHQ 2/9  Decreased Interest 0 0 0  Down, Depressed, Hopeless 0 0 0  PHQ - 2 Score 0 0 0  Altered sleeping   0  Tired, decreased energy   1  Change in appetite   0  Feeling bad or failure about yourself    0  Trouble concentrating   0  Moving slowly or fidgety/restless   0  Suicidal thoughts   0  PHQ-9 Score   1   Difficult doing work/chores   Not difficult at all     Data saved with a previous flowsheet row definition    History Pietro has a past medical history of Arthritis, Cancer (HCC), History of kidney stones, History of nephrolithiasis (10/21/2018), Hypertension, Kidney stone, and Sciatic nerve pain.   He has a past surgical history that includes Kidney surgery; Eye surgery (Bilateral); Nephrectomy (Left); Cataract extraction; Cystoscopy/ureteroscopy/holmium laser/stent placement (Right, 11/19/2018); Cystoscopy/ureteroscopy/holmium laser/stent placement (Right, 12/17/2018); and Superficial peroneal nerve release (Right, 05/21/2019).   His family history includes Cancer in his mother; Cancer (age of onset: 65) in his sister; Leukemia in his father; Schizophrenia in his sister.He reports that he has been smoking cigars. He has never used smokeless tobacco. He reports current alcohol use. He reports current drug use. Drug: Marijuana.    ROS Review of Systems  Constitutional: Negative.   HENT: Negative.    Eyes:  Negative for visual disturbance.  Respiratory:  Negative for cough and shortness of breath.   Cardiovascular:  Negative for chest  pain and leg swelling.  Gastrointestinal:  Positive for diarrhea. Negative for abdominal pain, nausea and vomiting.  Genitourinary:  Negative for difficulty urinating.  Musculoskeletal:  Negative for arthralgias and myalgias.  Skin:  Negative for rash.  Neurological:  Negative for headaches.  Psychiatric/Behavioral:  Negative for sleep disturbance.     Objective:  BP 132/69   Pulse 83   Temp 98 F  (36.7 C)   Ht 5' 10 (1.778 m)   Wt 195 lb (88.5 kg)   SpO2 98%   BMI 27.98 kg/m   BP Readings from Last 3 Encounters:  01/31/24 132/69  01/03/24 129/69  10/31/23 (!) 148/74    Wt Readings from Last 3 Encounters:  01/31/24 195 lb (88.5 kg)  01/03/24 205 lb 3.2 oz (93.1 kg)  10/31/23 207 lb 9.6 oz (94.2 kg)     Physical Exam Physical Exam GENERAL: Alert, cooperative, well developed, no acute distress HEENT: Normocephalic, normal oropharynx, moist mucous membranes CHEST: Clear to auscultation bilaterally, No wheezes, rhonchi, or crackles CARDIOVASCULAR: Normal heart rate and rhythm, S1 and S2 normal without murmurs ABDOMEN: Soft, non-tender, non-distended, without organomegaly, Normal bowel sounds EXTREMITIES: No cyanosis or edema NEUROLOGICAL: Cranial nerves grossly intact, Moves all extremities without gross motor or sensory deficit   Assessment & Plan:  Diverticulitis -     metroNIDAZOLE; Take 1 tablet (500 mg total) by mouth 2 (two) times daily.  Dispense: 14 tablet; Refill: 0  Diarrhea, unspecified type -     metroNIDAZOLE; Take 1 tablet (500 mg total) by mouth 2 (two) times daily.  Dispense: 14 tablet; Refill: 0  Other orders -     Fenofibrate ; Take 1 tablet (160 mg total) by mouth daily.  Dispense: 90 tablet; Refill: 2 -     Diphenoxylate-Atropine; Take 2 tablets by mouth 4 (four) times daily as needed for diarrhea or loose stools.  Dispense: 30 tablet; Refill: 1 -     Amoxicillin ; Take 1 tablet (875 mg total) by mouth 2 (two) times daily for 10 days.  Dispense: 20 tablet; Refill: 0    Assessment and Plan Assessment & Plan Chronic diarrhea   Since September 28th, 2025, he has experienced frequent loose bowel movements, primarily postprandial, with symptoms improving on diphenoxylate but recurring. Nocturnal episodes occurred twice. He reports no significant abdominal pain, only occasional gas. The differential includes an infectious etiology, though symptoms  differ from a previous E. coli infection. Fatty liver is not related to the diarrhea. Recent dietary changes and weight loss were discussed. Antibiotic treatment will be considered if symptoms recur. Diphenoxylate prescription was renewed. He was referred to a GI specialist for further evaluation. Metronidazole and amoxicillin  were prescribed for use if symptoms recur. He was advised to avoid cannabidiol and excessive alcohol while on antibiotics and instructed to monitor symptoms and start antibiotics if diarrhea returns.  General Health Maintenance   He was due for the second shingles vaccine and flu shot, and eligibility for both was confirmed. The second shingles vaccine and flu shot were administered.       Follow-up: Return in about 6 months (around 07/30/2024).  Butler Der, M.D.

## 2024-02-19 NOTE — Addendum Note (Signed)
 Addended by: JODENE AMI NOVAK on: 02/19/2024 04:31 PM   Modules accepted: Orders

## 2024-02-19 NOTE — Addendum Note (Signed)
 Addended by: JODENE AMI NOVAK on: 02/19/2024 04:26 PM   Modules accepted: Orders

## 2024-03-07 ENCOUNTER — Telehealth: Payer: Self-pay

## 2024-03-07 NOTE — Telephone Encounter (Signed)
 Copied from CRM #[id number]. Topic: Referral - Request for Referral >> Mar 07, 2024  1:56 PM Edsel HERO wrote: Did the patient discuss referral with their provider in the last year? Yes (If No - schedule appointment) (If Yes - send message)  Appointment offered? No  Type of order/referral and detailed reason for visit: Gastro/ Diarrhea  Preference of office, provider, location: Any  If referral order, have you been seen by this specialty before? No (If Yes, this issue or another issue? When? Where?  Can we respond through MyChart? No

## 2024-03-07 NOTE — Telephone Encounter (Signed)
 Last OV with PCP was on 11/13. Noted that patient was referred to GI. I do not see GI referral. Patient is requesting GI referral. Please advise.

## 2024-03-10 ENCOUNTER — Other Ambulatory Visit: Payer: Self-pay | Admitting: Family Medicine

## 2024-03-10 DIAGNOSIS — R197 Diarrhea, unspecified: Secondary | ICD-10-CM

## 2024-03-10 NOTE — Telephone Encounter (Signed)
Referral placed, as requested WS 

## 2024-03-10 NOTE — Telephone Encounter (Signed)
 Pt informed    LS

## 2024-03-17 ENCOUNTER — Other Ambulatory Visit: Payer: Self-pay

## 2024-03-17 DIAGNOSIS — K5792 Diverticulitis of intestine, part unspecified, without perforation or abscess without bleeding: Secondary | ICD-10-CM

## 2024-03-17 DIAGNOSIS — R197 Diarrhea, unspecified: Secondary | ICD-10-CM

## 2024-03-17 NOTE — Telephone Encounter (Signed)
 I looked into Patient's Chart and at this time I still do not see where a GI Referral has been placed.

## 2024-03-19 NOTE — Telephone Encounter (Signed)
 I got it :)  Referral sent to: [facility] at [facility] [address], [place] [address] [phone]

## 2024-03-24 ENCOUNTER — Encounter: Payer: Self-pay | Admitting: Gastroenterology

## 2024-03-24 NOTE — Telephone Encounter (Signed)
Noted  -LS

## 2024-04-21 ENCOUNTER — Ambulatory Visit: Admitting: Gastroenterology

## 2024-04-22 ENCOUNTER — Encounter: Payer: Self-pay | Admitting: Gastroenterology

## 2024-04-22 ENCOUNTER — Ambulatory Visit: Admitting: Gastroenterology

## 2024-04-22 VITALS — BP 133/74 | HR 93 | Temp 98.2°F | Ht 70.0 in | Wt 190.6 lb

## 2024-04-22 DIAGNOSIS — R634 Abnormal weight loss: Secondary | ICD-10-CM | POA: Insufficient documentation

## 2024-04-22 DIAGNOSIS — K760 Fatty (change of) liver, not elsewhere classified: Secondary | ICD-10-CM | POA: Diagnosis not present

## 2024-04-22 DIAGNOSIS — K529 Noninfective gastroenteritis and colitis, unspecified: Secondary | ICD-10-CM | POA: Diagnosis not present

## 2024-04-22 DIAGNOSIS — R197 Diarrhea, unspecified: Secondary | ICD-10-CM | POA: Insufficient documentation

## 2024-04-22 DIAGNOSIS — A09 Infectious gastroenteritis and colitis, unspecified: Secondary | ICD-10-CM

## 2024-04-22 NOTE — Patient Instructions (Signed)
 Very nice to meet you today!  Please collect stool test if your diarrhea returns.   Complete labs at your convenience. There is no need to fast for the labs.  You will need to complete stools/labs at [facility], [address] [place].

## 2024-05-05 ENCOUNTER — Encounter: Payer: Self-pay | Admitting: Family Medicine

## 2024-09-04 ENCOUNTER — Ambulatory Visit: Payer: Self-pay

## 2024-09-30 ENCOUNTER — Ambulatory Visit
